# Patient Record
Sex: Female | Born: 1980 | Race: Black or African American | Hispanic: No | Marital: Married | Smoking: Current every day smoker
Health system: Southern US, Community
[De-identification: ages and names within clinical notes are randomized; demographics above are authoritative.]

## PROBLEM LIST (undated history)

## (undated) DIAGNOSIS — E119 Type 2 diabetes mellitus without complications: Secondary | ICD-10-CM

## (undated) DIAGNOSIS — R519 Headache, unspecified: Secondary | ICD-10-CM

## (undated) DIAGNOSIS — E039 Hypothyroidism, unspecified: Secondary | ICD-10-CM

## (undated) DIAGNOSIS — M810 Age-related osteoporosis without current pathological fracture: Secondary | ICD-10-CM

## (undated) DIAGNOSIS — I10 Essential (primary) hypertension: Secondary | ICD-10-CM

## (undated) DIAGNOSIS — M1990 Unspecified osteoarthritis, unspecified site: Secondary | ICD-10-CM

## (undated) DIAGNOSIS — F32A Depression, unspecified: Secondary | ICD-10-CM

## (undated) DIAGNOSIS — E079 Disorder of thyroid, unspecified: Secondary | ICD-10-CM

## (undated) DIAGNOSIS — M2241 Chondromalacia patellae, right knee: Secondary | ICD-10-CM

## (undated) DIAGNOSIS — E785 Hyperlipidemia, unspecified: Secondary | ICD-10-CM

## (undated) DIAGNOSIS — Z9889 Other specified postprocedural states: Secondary | ICD-10-CM

## (undated) DIAGNOSIS — J45909 Unspecified asthma, uncomplicated: Secondary | ICD-10-CM

## (undated) DIAGNOSIS — R51 Headache: Secondary | ICD-10-CM

## (undated) HISTORY — DX: Depression, unspecified: F32.A

## (undated) HISTORY — DX: Unspecified osteoarthritis, unspecified site: M19.90

## (undated) HISTORY — DX: Age-related osteoporosis without current pathological fracture: M81.0

## (undated) HISTORY — PX: OTHER SURGICAL HISTORY: SHX169

## (undated) HISTORY — DX: Hyperlipidemia, unspecified: E78.5

## (undated) HISTORY — DX: Other specified postprocedural states: Z98.890

## (undated) HISTORY — DX: Chondromalacia patellae, right knee: M22.41

---

## 2010-10-17 DIAGNOSIS — Q690 Accessory finger(s): Secondary | ICD-10-CM | POA: Insufficient documentation

## 2010-10-17 DIAGNOSIS — J45909 Unspecified asthma, uncomplicated: Secondary | ICD-10-CM | POA: Insufficient documentation

## 2011-07-07 ENCOUNTER — Encounter (HOSPITAL_COMMUNITY): Payer: Self-pay | Admitting: *Deleted

## 2011-07-07 ENCOUNTER — Emergency Department (HOSPITAL_COMMUNITY)
Admission: EM | Admit: 2011-07-07 | Discharge: 2011-07-08 | Disposition: A | Discharge status: Skilled Nursing Facility | Payer: Self-pay | Attending: Emergency Medicine | Admitting: Emergency Medicine

## 2011-07-07 ENCOUNTER — Emergency Department (HOSPITAL_COMMUNITY): Payer: Self-pay

## 2011-07-07 DIAGNOSIS — M25569 Pain in unspecified knee: Secondary | ICD-10-CM | POA: Insufficient documentation

## 2011-07-07 DIAGNOSIS — R109 Unspecified abdominal pain: Secondary | ICD-10-CM | POA: Insufficient documentation

## 2011-07-07 DIAGNOSIS — R112 Nausea with vomiting, unspecified: Secondary | ICD-10-CM | POA: Insufficient documentation

## 2011-07-07 LAB — CBC
HCT: 36.4 % (ref 36.0–46.0)
Hemoglobin: 11.8 g/dL — ABNORMAL LOW (ref 12.0–15.0)
MCH: 23.6 pg — ABNORMAL LOW (ref 26.0–34.0)
MCHC: 32.4 g/dL (ref 30.0–36.0)
MCV: 72.8 fL — ABNORMAL LOW (ref 78.0–100.0)
Platelets: 224 10*3/uL (ref 150–400)
RBC: 5 MIL/uL (ref 3.87–5.11)
RDW: 16.2 % — ABNORMAL HIGH (ref 11.5–15.5)
WBC: 9.8 10*3/uL (ref 4.0–10.5)

## 2011-07-07 LAB — BASIC METABOLIC PANEL
BUN: 7 mg/dL (ref 6–23)
CO2: 28 mEq/L (ref 19–32)
Calcium: 10 mg/dL (ref 8.4–10.5)
Chloride: 100 mEq/L (ref 96–112)
Creatinine, Ser: 0.7 mg/dL (ref 0.50–1.10)
GFR calc Af Amer: >90 mL/min (ref >90)
GFR calc non Af Amer: >90 mL/min (ref >90)
Glucose, Bld: 88 mg/dL (ref 70–99)
Potassium: 3.5 mEq/L (ref 3.5–5.1)
Sodium: 136 mEq/L (ref 135–145)

## 2011-07-07 LAB — URINALYSIS, ROUTINE W REFLEX MICROSCOPIC
Bilirubin Urine: NEGATIVE
Glucose, UA: NEGATIVE mg/dL
Hgb urine dipstick: NEGATIVE
Ketones, ur: NEGATIVE mg/dL
Leukocytes, UA: NEGATIVE
Nitrite: NEGATIVE
Protein, ur: NEGATIVE mg/dL
Specific Gravity, Urine: 1.025 (ref 1.005–1.030)
Urobilinogen, UA: 1 mg/dL (ref 0.0–1.0)
pH: 7.5 (ref 5.0–8.0)

## 2011-07-07 LAB — LIPASE, BLOOD: Lipase: 39 U/L (ref 11–59)

## 2011-07-07 LAB — POCT PREGNANCY, URINE: Preg Test, Ur: NEGATIVE

## 2011-07-07 NOTE — ED Provider Notes (Signed)
History     CSN: [id number]  Arrival date & time 07/07/11  2044   First MD Initiated Contact with Patient 07/07/11 2221      Chief Complaint  Patient presents with  . Abdominal Pain  . Nausea  . Emesis  . Knee Pain    (Consider location/radiation/quality/duration/timing/severity/associated sxs/prior treatment) HPI Comments: Patient states she has had left side abdomen pain for nearly one year. She states this was last evaluated several months ago at which time she was told she had a left lung pneumonia. She states that the pain really hadn't changed a lot but she wanted to get it checked because she is worried and concerned about it. It is also of note that the patient had one day of nausea vomiting today. There is no blood in the vomitus. The patient denies fever. She denies injury to the abdomen. There's been no surgery to this left side of the abdomen. There's been no cough or congestion reported. Patient denies passing blood in the stools or blood in the urine. There's been no unusual vaginal discharge or bleeding.       The patient has not taken any medication for this pain.  Patient is a 31 y.o. female presenting with abdominal pain, vomiting, and knee pain. The history is provided by the patient.  Abdominal Pain The primary symptoms of the illness include abdominal pain and vomiting. The primary symptoms of the illness do not include shortness of breath or dysuria. The current episode started more than 2 days ago.  Symptoms associated with the illness do not include hematuria, frequency or back pain.  Emesis  Associated symptoms include abdominal pain and arthralgias. Pertinent negatives include no cough.  Knee Pain Associated symptoms include abdominal pain, arthralgias and vomiting. Pertinent negatives include no chest pain, coughing or neck pain.    History reviewed. No pertinent past medical history.  History reviewed. No pertinent past surgical history.  History  reviewed. No pertinent family history.  History  Substance Use Topics  . Smoking status: Current Everyday Smoker  . Smokeless tobacco: Not on file  . Alcohol Use: Yes    OB History    Grav Para Term Preterm Abortions TAB SAB Ect Mult Living                  Review of Systems  Constitutional: Negative for activity change.       All ROS Neg except as noted in HPI  HENT: Negative for nosebleeds and neck pain.   Eyes: Negative for photophobia and discharge.  Respiratory: Negative for cough, shortness of breath and wheezing.   Cardiovascular: Negative for chest pain and palpitations.  Gastrointestinal: Positive for vomiting and abdominal pain. Negative for blood in stool.  Genitourinary: Negative for dysuria, frequency and hematuria.  Musculoskeletal: Positive for arthralgias. Negative for back pain.  Skin: Negative.   Neurological: Negative for dizziness, seizures and speech difficulty.  Psychiatric/Behavioral: Negative for hallucinations and confusion.    Allergies  Review of patient's allergies indicates no known allergies.  Home Medications  No current outpatient prescriptions on file.  BP 125/72  Pulse 89  Temp 98.3 F (36.8 C)  Resp 20  Ht 5\' 3"  (1.6 m)  Wt 224 lb (101.606 kg)  BMI 39.68 kg/m2  SpO2 100%  LMP 06/25/2011  Physical Exam  Nursing note and vitals reviewed. Constitutional: She is oriented to person, place, and time. She appears well-developed and well-nourished.  Non-toxic appearance.  HENT:  Head: Normocephalic.  Right  Ear: Tympanic membrane and external ear normal.  Left Ear: Tympanic membrane and external ear normal.  Eyes: EOM and lids are normal. Pupils are equal, round, and reactive to light.  Neck: Normal range of motion. Neck supple. Carotid bruit is not present.  Cardiovascular: Normal rate, regular rhythm, normal heart sounds, intact distal pulses and normal pulses.   Pulmonary/Chest: Breath sounds normal. No respiratory distress.    Abdominal: Soft. Bowel sounds are normal. There is no tenderness. There is no guarding.       Mild to moderate left upper quadrant abdominal pain to palpation. No pain with flexing of the psoas. No mass appreciated. No pulsating mass appreciated.  Musculoskeletal: Normal range of motion.  Lymphadenopathy:       Head (right side): No submandibular adenopathy present.       Head (left side): No submandibular adenopathy present.    She has no cervical adenopathy.  Neurological: She is alert and oriented to person, place, and time. She has normal strength. No cranial nerve deficit or sensory deficit.  Skin: Skin is warm and dry.  Psychiatric: She has a normal mood and affect. Her speech is normal.    ED Course  Procedures (including critical care time)   Labs Reviewed  URINALYSIS, ROUTINE W REFLEX MICROSCOPIC   No results found.   No diagnosis found.    MDM  I have reviewed nursing notes, vital signs, and all appropriate lab and imaging results for this patient. Patient reports a nearly one-year of left-sided abdomen pain as well as some epigastric area pain. Vital signs today are within normal limits. Pregnancy test is negative. Chemistries are negative. Lipase is within normal limits. Complete blood count is nonacute. Urinalysis is well within normal limits. The acute abdomen films are unremarkable for any acute problem. There is some increase in stool and gas throughout the colon.  The patient advised of the laboratory findings and the exam findings. Will treat the patient with milk of magnesia 15 mL daily. As and give her a prescription for Norco 5 mg every 4 hours if needed for pain. Patient is to see Dr. Darrick Penna (GI) if not improving. It is felt to be that the patient is safe to return home, she is invited to return to the emergency department if any changes, problems, or concerns.       Kathie Dike, [place] 07/08/11 [phone]

## 2011-07-07 NOTE — ED Notes (Signed)
Pt c/o epigastric and upper left sided abdominal pain. Pt c/o n/v x 1 day and bilateral knee pain x 1wk.

## 2011-07-07 NOTE — ED Notes (Signed)
Patient does not feel as if she void at present

## 2011-07-08 MED ORDER — ONDANSETRON HCL 4 MG PO TABS
4.0000 mg | ORAL_TABLET | Freq: Once | ORAL | Status: AC
  Administered 2011-07-08: 4 mg via ORAL
  Filled 2011-07-08: qty 1

## 2011-07-08 MED ORDER — MAGNESIUM HYDROXIDE 400 MG/5ML PO SUSP: 15.0000 mL | Freq: Every day | ORAL | Status: AC

## 2011-07-08 MED ORDER — HYDROCODONE-ACETAMINOPHEN 5-325 MG PO TABS
2.0000 | ORAL_TABLET | Freq: Once | ORAL | Status: AC
  Administered 2011-07-08: 2 via ORAL
  Filled 2011-07-08: qty 2

## 2011-07-08 MED ORDER — MAGNESIUM HYDROXIDE 400 MG/5ML PO SUSP
15.0000 mL | Freq: Once | ORAL | Status: AC
  Administered 2011-07-08: 15 mL via ORAL
  Filled 2011-07-08: qty 30

## 2011-07-08 MED ORDER — HYDROCODONE-ACETAMINOPHEN 5-325 MG PO TABS: 1.0000 | ORAL_TABLET | ORAL | Status: AC | PRN

## 2011-07-08 NOTE — Discharge Instructions (Signed)
Your vital signs are within normal limits. Your blood test are negative for acute problem. Your xrays are negative for acute problem. Please use Milk of Magnesia daily. Use tylenol for mild pain. Use Norco for more severe pain. This may cause drowsiness, use with caution. Please see Dr Darrick Penna for GI evaluation if not improving.

## 2011-07-09 NOTE — ED Provider Notes (Signed)
Medical screening examination/treatment/procedure(s) were performed by non-physician practitioner and as supervising physician I was immediately available for consultation/collaboration.   Ryah Cribb W Adriauna Campton, MD 07/09/11 0125 

## 2011-09-02 ENCOUNTER — Emergency Department (HOSPITAL_COMMUNITY)
Admission: EM | Admit: 2011-09-02 | Discharge: 2011-09-02 | Disposition: A | Discharge status: Home / Self Care | Payer: Self-pay | Attending: Emergency Medicine | Admitting: Emergency Medicine

## 2011-09-02 ENCOUNTER — Encounter (HOSPITAL_COMMUNITY): Payer: Self-pay | Admitting: *Deleted

## 2011-09-02 DIAGNOSIS — F17200 Nicotine dependence, unspecified, uncomplicated: Secondary | ICD-10-CM | POA: Insufficient documentation

## 2011-09-02 DIAGNOSIS — R197 Diarrhea, unspecified: Secondary | ICD-10-CM

## 2011-09-02 DIAGNOSIS — J45909 Unspecified asthma, uncomplicated: Secondary | ICD-10-CM | POA: Insufficient documentation

## 2011-09-02 DIAGNOSIS — R509 Fever, unspecified: Secondary | ICD-10-CM | POA: Insufficient documentation

## 2011-09-02 HISTORY — DX: Unspecified asthma, uncomplicated: J45.909

## 2011-09-02 LAB — URINALYSIS, ROUTINE W REFLEX MICROSCOPIC
Bilirubin Urine: NEGATIVE
Glucose, UA: NEGATIVE mg/dL
Hgb urine dipstick: NEGATIVE
Ketones, ur: NEGATIVE mg/dL
Leukocytes, UA: NEGATIVE
Nitrite: NEGATIVE
Protein, ur: NEGATIVE mg/dL
Specific Gravity, Urine: 1.025 (ref 1.005–1.030)
Urobilinogen, UA: 1 mg/dL (ref 0.0–1.0)
pH: 6 (ref 5.0–8.0)

## 2011-09-02 MED ORDER — LOPERAMIDE HCL 2 MG PO CAPS: 2.0000 mg | ORAL_CAPSULE | Freq: Four times a day (QID) | ORAL | Status: AC | PRN

## 2011-09-02 MED ORDER — ACETAMINOPHEN 325 MG PO TABS
650.0000 mg | ORAL_TABLET | Freq: Once | ORAL | Status: AC
  Administered 2011-09-02: 650 mg via ORAL
  Filled 2011-09-02: qty 2

## 2011-09-02 MED ORDER — ONDANSETRON HCL 4 MG/2ML IJ SOLN
4.0000 mg | Freq: Once | INTRAMUSCULAR | Status: AC
  Administered 2011-09-02: 4 mg via INTRAVENOUS
  Filled 2011-09-02: qty 2

## 2011-09-02 MED ORDER — IBUPROFEN 800 MG PO TABS: 800.0000 mg | ORAL_TABLET | Freq: Three times a day (TID) | ORAL | Status: AC

## 2011-09-02 MED ORDER — SODIUM CHLORIDE 0.9 % IV BOLUS (SEPSIS)
1000.0000 mL | Freq: Once | INTRAVENOUS | Status: AC
  Administered 2011-09-02: 1000 mL via INTRAVENOUS

## 2011-09-02 MED ORDER — KETOROLAC TROMETHAMINE 30 MG/ML IJ SOLN
30.0000 mg | Freq: Once | INTRAMUSCULAR | Status: AC
  Administered 2011-09-02: 30 mg via INTRAVENOUS
  Filled 2011-09-02: qty 1

## 2011-09-02 NOTE — ED Notes (Signed)
Pt c/o fever, chills, and diarrhea since this morning.

## 2011-09-02 NOTE — ED Provider Notes (Signed)
History     CSN: [id number]  Arrival date & time 09/02/11  0035   First MD Initiated Contact with Patient 09/02/11 0035      Chief Complaint  Patient presents with  . Fever  . Diarrhea  . Chills    (Consider location/radiation/quality/duration/timing/severity/associated sxs/prior treatment) HPI Hx per PT. Fever and chills with body aches today, some muscle aches in her lower back. No dysuria, urgency or frequency otherwise.  No ABD pain, no nause or vomiting, has had loose stools tonight, no blood in stools. No recent travel or ABx, no known sick contacts. No rash. MOD in severity.  Past Medical History  Diagnosis Date  . Asthma     History reviewed. No pertinent past surgical history.  No family history on file.  History  Substance Use Topics  . Smoking status: Current Everyday Smoker  . Smokeless tobacco: Not on file  . Alcohol Use: Yes    OB History    Grav Para Term Preterm Abortions TAB SAB Ect Mult Living                  Review of Systems  Constitutional: Positive for fever and chills.  HENT: Negative for neck pain and neck stiffness.   Eyes: Negative for pain.  Respiratory: Negative for shortness of breath.   Cardiovascular: Negative for chest pain.  Gastrointestinal: Positive for diarrhea. Negative for abdominal pain, blood in stool and anal bleeding.  Genitourinary: Negative for dysuria.  Musculoskeletal: Negative for back pain.  Skin: Negative for rash.  Neurological: Negative for headaches.  All other systems reviewed and are negative.    Allergies  Review of patient's allergies indicates no known allergies.  Home Medications   Current Outpatient Rx  Name Route Sig Dispense Refill  . ALBUTEROL SULFATE HFA 108 (90 BASE) MCG/ACT IN AERS Inhalation Inhale 2 puffs into the lungs every 6 (six) hours as needed. Shortness of breath      BP 96/55  Pulse 90  Temp 99 F (37.2 C) (Oral)  Resp 20  Ht 5\' 5"  (1.651 m)  Wt 230 lb (104.327 kg)  BMI  38.27 kg/m2  SpO2 95%  LMP 08/06/2011  Physical Exam  Constitutional: She is oriented to person, place, and time. She appears well-developed and well-nourished.  HENT:  Head: Normocephalic and atraumatic.  Eyes: Conjunctivae and EOM are normal. Pupils are equal, round, and reactive to light.  Neck: Trachea normal. Neck supple. No thyromegaly present.  Cardiovascular: Regular rhythm, S1 normal, S2 normal and normal pulses.     No systolic murmur is present   No diastolic murmur is present  Pulses:      Radial pulses are 2+ on the right side, and 2+ on the left side.       tachycardic  Pulmonary/Chest: Effort normal and breath sounds normal. She has no wheezes. She has no rhonchi. She has no rales. She exhibits no tenderness.  Abdominal: Soft. Normal appearance and bowel sounds are normal. There is no tenderness. There is no rebound, no guarding, no CVA tenderness and negative Murphy's sign.  Musculoskeletal:       BLE:s Calves nontender, no cords or erythema, negative Homans sign  Neurological: She is alert and oriented to person, place, and time. She has normal strength. No cranial nerve deficit or sensory deficit. GCS eye subscore is 4. GCS verbal subscore is 5. GCS motor subscore is 6.  Skin: Skin is warm and dry. No rash noted. She is not diaphoretic.  Psychiatric: Her speech is normal.       Cooperative and appropriate    ED Course  Procedures (including critical care time)  Results for orders placed during the hospital encounter of 09/02/11  URINALYSIS, ROUTINE W REFLEX MICROSCOPIC      Component Value Range   Color, Urine YELLOW  YELLOW   APPearance CLEAR  CLEAR   Specific Gravity, Urine 1.025  1.005 - 1.030   pH 6.0  5.0 - 8.0   Glucose, UA NEGATIVE  NEGATIVE mg/dL   Hgb urine dipstick NEGATIVE  NEGATIVE   Bilirubin Urine NEGATIVE  NEGATIVE   Ketones, ur NEGATIVE  NEGATIVE mg/dL   Protein, ur NEGATIVE  NEGATIVE mg/dL   Urobilinogen, UA 1.0  0.0 - 1.0 mg/dL   Nitrite  NEGATIVE  NEGATIVE   Leukocytes, UA NEGATIVE  NEGATIVE   IVFs. Tylenol and zofran   Serial exams, no acute ABD. No indication for emergent imaging, given otherwise healthy female with normal UA and improving condition, do not feel lab work is indicated, no risk factors for  C diff.  No blood in stools or indication for ABx to treat infectious colitis. Plan d/c home with close follow up and diarrhea/ fever precautions verbalized as understood.   Rx provided.  MDM  nursing notes and VS reviewed- tachycardia normalized with improved condition, IVFs. serial exams. Medications as above.          Sunnie Nielsen, MD 09/02/11 0330

## 2011-09-02 NOTE — ED Notes (Signed)
Resting quietly, appears to be sleeping.

## 2011-09-02 NOTE — ED Notes (Signed)
Ambulatory to bathroom with steady gait

## 2012-03-16 ENCOUNTER — Emergency Department (HOSPITAL_COMMUNITY)
Admission: EM | Admit: 2012-03-16 | Discharge: 2012-03-16 | Disposition: A | Discharge status: Home / Self Care | Payer: Self-pay | Attending: Emergency Medicine | Admitting: Emergency Medicine

## 2012-03-16 ENCOUNTER — Encounter (HOSPITAL_COMMUNITY): Payer: Self-pay

## 2012-03-16 DIAGNOSIS — S0502XA Injury of conjunctiva and corneal abrasion without foreign body, left eye, initial encounter: Secondary | ICD-10-CM

## 2012-03-16 DIAGNOSIS — J45909 Unspecified asthma, uncomplicated: Secondary | ICD-10-CM | POA: Insufficient documentation

## 2012-03-16 DIAGNOSIS — Y9289 Other specified places as the place of occurrence of the external cause: Secondary | ICD-10-CM | POA: Insufficient documentation

## 2012-03-16 DIAGNOSIS — T1590XA Foreign body on external eye, part unspecified, unspecified eye, initial encounter: Secondary | ICD-10-CM | POA: Insufficient documentation

## 2012-03-16 DIAGNOSIS — S058X9A Other injuries of unspecified eye and orbit, initial encounter: Secondary | ICD-10-CM | POA: Insufficient documentation

## 2012-03-16 DIAGNOSIS — Y9301 Activity, walking, marching and hiking: Secondary | ICD-10-CM | POA: Insufficient documentation

## 2012-03-16 DIAGNOSIS — F17200 Nicotine dependence, unspecified, uncomplicated: Secondary | ICD-10-CM | POA: Insufficient documentation

## 2012-03-16 MED ORDER — NEOMYCIN-BACITRACIN ZN-POLYMYX 5-400-10000 OP OINT
TOPICAL_OINTMENT | Freq: Once | OPHTHALMIC | Status: AC
  Administered 2012-03-16: 20:00:00 via OPHTHALMIC

## 2012-03-16 MED ORDER — TETRACAINE HCL 0.5 % OP SOLN
2.0000 [drp] | Freq: Once | OPHTHALMIC | Status: AC
  Administered 2012-03-16: 2 [drp] via OPHTHALMIC

## 2012-03-16 MED ORDER — KETOROLAC TROMETHAMINE 0.5 % OP SOLN
1.0000 [drp] | Freq: Once | OPHTHALMIC | Status: AC
  Administered 2012-03-16: 1 [drp] via OPHTHALMIC
  Filled 2012-03-16: qty 5

## 2012-03-16 MED ORDER — POLYMYXIN B-TRIMETHOPRIM 10000-0.1 UNIT/ML-% OP SOLN
1.0000 [drp] | OPHTHALMIC | Status: DC
  Filled 2012-03-16: qty 10

## 2012-03-16 MED ORDER — PROPARACAINE HCL 0.5 % OP SOLN
2.0000 [drp] | Freq: Once | OPHTHALMIC | Status: DC
  Filled 2012-03-16: qty 15

## 2012-03-16 MED ORDER — FLUORESCEIN SODIUM 1 MG OP STRP
ORAL_STRIP | OPHTHALMIC | Status: AC
  Administered 2012-03-16: 1 via OPHTHALMIC
  Filled 2012-03-16: qty 1

## 2012-03-16 MED ORDER — FLUORESCEIN SODIUM 1 MG OP STRP
1.0000 | ORAL_STRIP | Freq: Once | OPHTHALMIC | Status: AC
  Administered 2012-03-16: 1 via OPHTHALMIC

## 2012-03-16 MED ORDER — BACITRACIN-NEOMYCIN-POLYMYXIN 400-5-5000 EX OINT: TOPICAL_OINTMENT | Freq: Four times a day (QID) | CUTANEOUS | Status: DC

## 2012-03-16 MED ORDER — TETRACAINE HCL 0.5 % OP SOLN
OPHTHALMIC | Status: AC
  Administered 2012-03-16: 2 [drp] via OPHTHALMIC
  Filled 2012-03-16: qty 2
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
## 2012-03-16 NOTE — ED Notes (Signed)
Dr Blinda Leatherwood in room assessing pt with slit lamp.

## 2012-03-16 NOTE — ED Notes (Signed)
Pt sent home with eye drops per MD instruction. NAD at d/c, pt states slight improvement in pain in eye

## 2012-06-27 ENCOUNTER — Encounter (HOSPITAL_COMMUNITY): Payer: Self-pay | Admitting: *Deleted

## 2012-06-27 ENCOUNTER — Emergency Department (HOSPITAL_COMMUNITY)
Admission: EM | Admit: 2012-06-27 | Discharge: 2012-06-27 | Disposition: A | Discharge status: Home / Self Care | Payer: Self-pay | Attending: Emergency Medicine | Admitting: Emergency Medicine

## 2012-06-27 ENCOUNTER — Emergency Department (HOSPITAL_COMMUNITY): Payer: Self-pay

## 2012-06-27 DIAGNOSIS — R071 Chest pain on breathing: Secondary | ICD-10-CM | POA: Insufficient documentation

## 2012-06-27 DIAGNOSIS — J159 Unspecified bacterial pneumonia: Secondary | ICD-10-CM | POA: Insufficient documentation

## 2012-06-27 DIAGNOSIS — R0602 Shortness of breath: Secondary | ICD-10-CM | POA: Insufficient documentation

## 2012-06-27 DIAGNOSIS — R059 Cough, unspecified: Secondary | ICD-10-CM | POA: Insufficient documentation

## 2012-06-27 DIAGNOSIS — F17200 Nicotine dependence, unspecified, uncomplicated: Secondary | ICD-10-CM | POA: Insufficient documentation

## 2012-06-27 DIAGNOSIS — R1110 Vomiting, unspecified: Secondary | ICD-10-CM | POA: Insufficient documentation

## 2012-06-27 DIAGNOSIS — R05 Cough: Secondary | ICD-10-CM | POA: Insufficient documentation

## 2012-06-27 DIAGNOSIS — J45901 Unspecified asthma with (acute) exacerbation: Secondary | ICD-10-CM | POA: Insufficient documentation

## 2012-06-27 DIAGNOSIS — Z79899 Other long term (current) drug therapy: Secondary | ICD-10-CM | POA: Insufficient documentation

## 2012-06-27 DIAGNOSIS — J189 Pneumonia, unspecified organism: Secondary | ICD-10-CM

## 2012-06-27 MED ORDER — AZITHROMYCIN 250 MG PO TABS: ORAL_TABLET | ORAL | Status: DC

## 2012-06-27 MED ORDER — IPRATROPIUM BROMIDE 0.02 % IN SOLN
0.5000 mg | Freq: Once | RESPIRATORY_TRACT | Status: AC
  Administered 2012-06-27: 0.5 mg via RESPIRATORY_TRACT
  Filled 2012-06-27: qty 2.5

## 2012-06-27 MED ORDER — AZITHROMYCIN 250 MG PO TABS
500.0000 mg | ORAL_TABLET | Freq: Once | ORAL | Status: AC
  Administered 2012-06-27: 500 mg via ORAL
  Filled 2012-06-27: qty 2

## 2012-06-27 MED ORDER — ALBUTEROL SULFATE (5 MG/ML) 0.5% IN NEBU
5.0000 mg | INHALATION_SOLUTION | Freq: Once | RESPIRATORY_TRACT | Status: AC
  Administered 2012-06-27: 5 mg via RESPIRATORY_TRACT
  Filled 2012-06-27: qty 1

## 2012-06-27 MED ORDER — CEPHALEXIN 500 MG PO CAPS: ORAL_CAPSULE | ORAL | Status: DC

## 2012-06-27 MED ORDER — PREDNISONE 20 MG PO TABS: ORAL_TABLET | ORAL | Status: DC

## 2012-06-27 MED ORDER — CEPHALEXIN 500 MG PO CAPS
1000.0000 mg | ORAL_CAPSULE | Freq: Once | ORAL | Status: AC
  Administered 2012-06-27: 1000 mg via ORAL
  Filled 2012-06-27: qty 2

## 2012-06-27 MED ORDER — HYDROCODONE-ACETAMINOPHEN 5-325 MG PO TABS: 2.0000 | ORAL_TABLET | Freq: Four times a day (QID) | ORAL | Status: DC | PRN

## 2012-06-27 MED ORDER — PREDNISONE 50 MG PO TABS
60.0000 mg | ORAL_TABLET | Freq: Once | ORAL | Status: AC
  Administered 2012-06-27: 60 mg via ORAL
  Filled 2012-06-27: qty 1

## 2012-06-27 NOTE — ED Provider Notes (Signed)
History    This chart was scribed for Michelle Horn, MD by Quintella Reichert, ED scribe.  This patient was seen in room [facility] and the patient's care was started at 9:12 PM.   CSN: [id number]  Arrival date & time 06/27/12  1916     Chief Complaint  Patient presents with  . Cough  . Emesis     The history is provided by the patient. No language interpreter was used.    HPI Comments: [name] is a 32 y.o. female with asthma who presents to the Emergency Department complaining of frequent cough that began yesterday afternoon, with accompanying wheezing, mild SOB, post-tussive emesis, and mild right-sided rib pain secondary to cough.  She denies abdominal pain, fever, chills, or any other associated symptoms.  She attempted to treat symptoms with her albuterol inhaler, without relief.  Pt last received steroid treatment over 1 year ago.  She denies h/o cancer, blood clots, DM, sickle cell disease or any other chronic medical conditions.  She denies possibility of pregnancy and is not on birth control.  PERC negative.  Past Medical History  Diagnosis Date  . Asthma     History reviewed. No pertinent past surgical history.  History reviewed. No pertinent family history.  History  Substance Use Topics  . Smoking status: Current Every Day Smoker -- 1.00 packs/day  . Smokeless tobacco: Not on file  . Alcohol Use: Yes     Comment: Social drinker     OB History   Grav Para Term Preterm Abortions TAB SAB Ect Mult Living                  Review of Systems 10 Systems reviewed and all are negative for acute change except as noted in the HPI.    Allergies  Review of patient's allergies indicates no known allergies.  Home Medications   Current Outpatient Rx  Name  Route  Sig  Dispense  Refill  . albuterol (VENTOLIN HFA) 108 (90 BASE) MCG/ACT inhaler   Inhalation   Inhale 2 puffs into the lungs every 6 (six) hours as needed for wheezing or shortness of  breath.         . levothyroxine (SYNTHROID, LEVOTHROID) 25 MCG tablet   Oral   Take 25 mcg by mouth daily.         Marland Kitchen azithromycin (ZITHROMAX Z-PAK) 250 MG tablet      1 daily x 4 days   4 tablet   0   . cephALEXin (KEFLEX) 500 MG capsule      2 caps po bid x 7 days   28 capsule   0   . HYDROcodone-acetaminophen (NORCO) 5-325 MG per tablet   Oral   Take 2 tablets by mouth every 6 (six) hours as needed for pain.   15 tablet   0   . predniSONE (DELTASONE) 20 MG tablet      2 tabs po daily x 4 days   8 tablet   0     BP 138/78  Pulse 90  Temp(Src) 99.2 F (37.3 C) (Oral)  Resp 20  Ht 5\' 3"  (1.6 m)  Wt 250 lb (113.399 kg)  BMI 44.3 kg/m2  SpO2 97%  LMP 06/13/2012  Physical Exam  Nursing note and vitals reviewed. Constitutional:  Awake, alert, nontoxic appearance.  HENT:  Head: Atraumatic.  Eyes: Right eye exhibits no discharge. Left eye exhibits no discharge.  Neck: Neck supple.  Cardiovascular: Normal rate, regular  rhythm and normal heart sounds.   No murmur heard. Pulmonary/Chest: Effort normal. No respiratory distress. She has wheezes (Diffuse wheezes, inspiratory and expiratory). She has no rales. She exhibits tenderness.  Chest wall is diffusely tender No crackles, no accessory muscle usage.  Abdominal: Soft. There is no tenderness. There is no rebound.  Musculoskeletal: She exhibits no tenderness.  Baseline ROM, no obvious new focal weakness.  Neurological:  Mental status and motor strength appears baseline for patient and situation.  Skin: No rash noted.  Psychiatric: She has a normal mood and affect.    ED Course  Procedures (including critical care time)  DIAGNOSTIC STUDIES: Oxygen Saturation is 97% on room air, normal by my interpretation.    COORDINATION OF CARE: 9:17 PM-Informed pt that x-ray revealed possible pneumonia.  Discussed treatment plan which includes breathing treatment, steroid treatment, antibiotics and pain medicine.    Patient understands and agrees with initial ED impression and plan with expectations set for ED visit.  10:11 PM: Pt feels better after breathing treatment.  Exhibits mild scattered end-expiratory wheezing.  Air movement significantly improved.  Patient informed of clinical course, understands medical decision-making process, and agrees with plan.   Labs Reviewed - No data to display Dg Chest 2 View  06/27/2012   *RADIOLOGY REPORT*  Clinical Data: Chest pain.  Vomiting.  CHEST - 2 VIEW  Comparison: None.  Findings: Mild airspace disease is seen in the posterior right lower lobe. Left lung is clear.  No evidence of pleural effusion. Heart size is normal.  IMPRESSION: Mild posterior right lower lobe airspace disease.  Differential diagnosis includes pneumonia and hemorrhage.   Original Report Authenticated By: Myles Rosenthal, M.D.     1. CAP (community acquired pneumonia)   2. Asthma attack       MDM  I personally performed the services described in this documentation, which was scribed in my presence. The recorded information has been reviewed and is accurate. Patient / Family / Caregiver informed of clinical course, understand medical decision-making process, and agree with plan. I doubt any other EMC precluding discharge at this time including, but not necessarily limited to the following:sepsis.    Michelle Horn, MD 06/28/12 [phone]

## 2012-06-27 NOTE — ED Notes (Signed)
Pt c/o chest pain when she coughs. Pt states every time she eats, her food comes right back up.

## 2012-06-27 NOTE — ED Notes (Signed)
Pt states chest pain since yesterday, states worse with a deep breath and accompanied by a strong cough. STates when she coughs the pain is worse and sometimes causes her to vomit. Cough is noted on assessment.

## 2012-06-27 NOTE — ED Notes (Signed)
Dr. Bednar at bedside. 

## 2012-07-04 ENCOUNTER — Emergency Department (HOSPITAL_COMMUNITY)
Admission: EM | Admit: 2012-07-04 | Discharge: 2012-07-04 | Disposition: A | Discharge status: Home / Self Care | Payer: Self-pay | Attending: Emergency Medicine | Admitting: Emergency Medicine

## 2012-07-04 ENCOUNTER — Encounter (HOSPITAL_COMMUNITY): Payer: Self-pay

## 2012-07-04 ENCOUNTER — Emergency Department (HOSPITAL_COMMUNITY): Payer: Self-pay

## 2012-07-04 DIAGNOSIS — J189 Pneumonia, unspecified organism: Secondary | ICD-10-CM | POA: Insufficient documentation

## 2012-07-04 DIAGNOSIS — R079 Chest pain, unspecified: Secondary | ICD-10-CM | POA: Insufficient documentation

## 2012-07-04 DIAGNOSIS — J45909 Unspecified asthma, uncomplicated: Secondary | ICD-10-CM | POA: Insufficient documentation

## 2012-07-04 DIAGNOSIS — R51 Headache: Secondary | ICD-10-CM | POA: Insufficient documentation

## 2012-07-04 DIAGNOSIS — Z79899 Other long term (current) drug therapy: Secondary | ICD-10-CM | POA: Insufficient documentation

## 2012-07-04 DIAGNOSIS — M2540 Effusion, unspecified joint: Secondary | ICD-10-CM | POA: Insufficient documentation

## 2012-07-04 DIAGNOSIS — J029 Acute pharyngitis, unspecified: Secondary | ICD-10-CM | POA: Insufficient documentation

## 2012-07-04 DIAGNOSIS — R05 Cough: Secondary | ICD-10-CM | POA: Insufficient documentation

## 2012-07-04 DIAGNOSIS — R059 Cough, unspecified: Secondary | ICD-10-CM | POA: Insufficient documentation

## 2012-07-04 DIAGNOSIS — F17200 Nicotine dependence, unspecified, uncomplicated: Secondary | ICD-10-CM | POA: Insufficient documentation

## 2012-07-04 DIAGNOSIS — R6884 Jaw pain: Secondary | ICD-10-CM | POA: Insufficient documentation

## 2012-07-04 MED ORDER — NAPROXEN 500 MG PO TABS: 500.0000 mg | ORAL_TABLET | Freq: Two times a day (BID) | ORAL | Status: DC

## 2012-07-04 NOTE — ED Notes (Signed)
Pt reports being dx with pna on Friday and is here to follow up.  Pt reports pain to her ribs bilaterally and sore throat.  Pt reports taking her antibiotics as prescribed.

## 2012-07-04 NOTE — ED Provider Notes (Signed)
History    This chart was scribed for Shelda Jakes, MD by Leone Payor, ED Scribe. This patient was seen in room APA04/APA04 and the patient's care was started 7:41 AM.   CSN: [id number]  Arrival date & time 07/04/12  0715   First MD Initiated Contact with Patient 07/04/12 0735      No chief complaint on file.    The history is provided by the patient. No language interpreter was used.    HPI Comments: [name] is a 32 y.o. female who presents to the Emergency Department complaining of constant, unchanged body aches starting in the last few days. Pt was diagnosed with pneumonia 1 week ago and she is here to follow up because her symptoms are not improving. She reports having an associated mild sore throat and L jaw pain that started yesterday. She continues to feel body aches and productive cough. She denies having any recent fevers. She was prescribed Keflex, Zithromax, and prednisone which she has been compliant with. She has a h/o asthma and uses an inhaler at home.   PCP [facility] Dept Past Medical History  Diagnosis Date  . Asthma     History reviewed. No pertinent past surgical history.  No family history on file.  History  Substance Use Topics  . Smoking status: Current Every Day Smoker -- 1.00 packs/day  . Smokeless tobacco: Not on file  . Alcohol Use: Yes     Comment: Social drinker     OB History   Grav Para Term Preterm Abortions TAB SAB Ect Mult Living                  Review of Systems  Constitutional: Negative for fever.  HENT: Positive for sore throat.   Eyes: Negative for visual disturbance.  Respiratory: Positive for cough. Negative for shortness of breath.   Cardiovascular: Positive for chest pain (soreness).  Gastrointestinal: Negative for nausea, vomiting, abdominal pain and diarrhea.  Genitourinary: Negative for dysuria and hematuria.  Musculoskeletal: Positive for myalgias and joint swelling.  Skin: Negative for rash.   Neurological: Positive for headaches. Negative for dizziness.  Hematological: Does not bruise/bleed easily.  Psychiatric/Behavioral: Negative for confusion.    Allergies  Review of patient's allergies indicates no known allergies.  Home Medications   Current Outpatient Rx  Name  Route  Sig  Dispense  Refill  . HYDROcodone-acetaminophen (NORCO) 5-325 MG per tablet   Oral   Take 2 tablets by mouth every 6 (six) hours as needed for pain.   15 tablet   0   . levothyroxine (SYNTHROID, LEVOTHROID) 25 MCG tablet   Oral   Take 25 mcg by mouth daily.         Marland Kitchen albuterol (VENTOLIN HFA) 108 (90 BASE) MCG/ACT inhaler   Inhalation   Inhale 2 puffs into the lungs every 6 (six) hours as needed for wheezing or shortness of breath.         . naproxen (NAPROSYN) 500 MG tablet   Oral   Take 1 tablet (500 mg total) by mouth 2 (two) times daily.   14 tablet   0     BP 137/89  Pulse 88  Temp(Src) 98.6 F (37 C) (Oral)  Resp 16  Ht 5\' 3"  (1.6 m)  Wt 250 lb (113.399 kg)  BMI 44.3 kg/m2  SpO2 96%  LMP 06/13/2012  Physical Exam  Nursing note and vitals reviewed. Constitutional: She is oriented to person, place, and time.  She appears well-developed and well-nourished. No distress.  HENT:  Head: Normocephalic and atraumatic.  Mouth/Throat: Uvula is midline and oropharynx is clear and moist. No oropharyngeal exudate.  Eyes: EOM are normal.  Neck: Neck supple. No tracheal deviation present.  Cardiovascular: Normal rate, regular rhythm and normal heart sounds.   No murmur heard. Pulmonary/Chest: Effort normal and breath sounds normal. No respiratory distress. She has no wheezes. She has no rales.  Abdominal: Soft. Bowel sounds are normal. There is no tenderness.  Musculoskeletal: Normal range of motion.  Lymphadenopathy:    She has no cervical adenopathy.  Neurological: She is alert and oriented to person, place, and time.  Skin: Skin is warm and dry.  Psychiatric: She has a  normal mood and affect. Her behavior is normal.    ED Course  Procedures (including critical care time)  DIAGNOSTIC STUDIES: Oxygen Saturation is 96% on RA, adequate by my interpretation.    COORDINATION OF CARE: 7:50 AM Discussed treatment plan with pt at bedside and pt agreed to plan.     Labs Reviewed - No data to display Dg Chest 2 View  07/04/2012   *RADIOLOGY REPORT*  Clinical Data: Chest pain  CHEST - 2 VIEW  Comparison:  June 27, 2012  Findings:  Most of the patchy infiltrate has cleared from the right base.  There is mild subsegmental atelectasis remaining in the right base.  Elsewhere, lungs are clear.  Heart size and pulmonary vascularity are normal.  No adenopathy.  No pneumothorax.  No bone lesions.  IMPRESSION: Significant clearing from the right base since recent prior study. Patchy mild atelectasis remains in this area.  Elsewhere, lungs are clear.   Original Report Authenticated By: Bretta Bang, M.D.   Medications - No data to display    1. CAP (community acquired pneumonia)       MDM  Chest x-ray shows a pneumonia has essentially resolved. Patient subjectively talks about swelling to the left side of the jaw clinically on examination no adenopathy no significant swelling. Tonsils but no exudate uvula is midline no evidence of a peritonsillar abscess at this point in time. Patient's symptoms may be in part due to a viral illness and then developing pneumonia which has resolved also no wheezing no respiratory distress. The patient will be discharged home with precautions to return if anything newer worse develops. She'll continue to finish out her Keflex. Patient is already finished her Z-Pak.  I personally performed the services described in this documentation, which was scribed in my presence. The recorded information has been reviewed and is accurate.    Shelda Jakes, MD 07/04/12 [phone]

## 2012-10-17 ENCOUNTER — Emergency Department (HOSPITAL_COMMUNITY)
Admission: EM | Admit: 2012-10-17 | Discharge: 2012-10-17 | Disposition: A | Discharge status: Home / Self Care | Payer: Medicaid Other | Attending: Emergency Medicine | Admitting: Emergency Medicine

## 2012-10-17 ENCOUNTER — Encounter (HOSPITAL_COMMUNITY): Payer: Self-pay | Admitting: *Deleted

## 2012-10-17 DIAGNOSIS — J45909 Unspecified asthma, uncomplicated: Secondary | ICD-10-CM | POA: Insufficient documentation

## 2012-10-17 DIAGNOSIS — Z79899 Other long term (current) drug therapy: Secondary | ICD-10-CM | POA: Insufficient documentation

## 2012-10-17 DIAGNOSIS — M722 Plantar fascial fibromatosis: Secondary | ICD-10-CM | POA: Insufficient documentation

## 2012-10-17 DIAGNOSIS — F17200 Nicotine dependence, unspecified, uncomplicated: Secondary | ICD-10-CM | POA: Insufficient documentation

## 2012-10-17 LAB — GLUCOSE, CAPILLARY: Glucose-Capillary: 82 mg/dL (ref 70–99)

## 2012-10-17 MED ORDER — HYDROCODONE-ACETAMINOPHEN 5-325 MG PO TABS: 1.0000 | ORAL_TABLET | ORAL | Status: DC | PRN

## 2012-10-17 MED ORDER — IBUPROFEN 800 MG PO TABS: 800.0000 mg | ORAL_TABLET | Freq: Three times a day (TID) | ORAL | Status: DC

## 2012-10-17 MED ORDER — HYDROCODONE-ACETAMINOPHEN 5-325 MG PO TABS
1.0000 | ORAL_TABLET | Freq: Once | ORAL | Status: AC
  Administered 2012-10-17: 1 via ORAL
  Filled 2012-10-17: qty 1

## 2012-10-17 NOTE — ED Notes (Signed)
Bi-lateral feet pain for 3 months.  No known injury

## 2012-10-17 NOTE — ED Notes (Addendum)
Pt and her coworker state that her coworker is driving pt home. Dangers of driving after taking pain medication were discussed.

## 2012-10-17 NOTE — ED Notes (Signed)
Pt states that both of her feet have been hurting for 3 months. She saw a Dr and the Dr told her that she had tendonitis. She states that she got a shot but it did not help.

## 2012-10-17 NOTE — ED Provider Notes (Signed)
CSN: [id number]     Arrival date & time 10/17/12  1948 History   First MD Initiated Contact with Patient 10/17/12 2049     Chief Complaint  Patient presents with  . bi-lateral feet pain    (Consider location/radiation/quality/duration/timing/severity/associated sxs/prior Treatment) HPI History provided by pt.   Pt presents w/ bilateral plantar foot pain for the past 3 months.  Pain constant but worst in am upon waking and with bearing weight.  Has been applying ice without relief.  No associated fever, rash, paresthesias.  Has chronic, intermittent and stable left low back pain.  Denies injury to feet/back.  Works on her feet on concrete floor every day at work.  Past Medical History  Diagnosis Date  . Asthma    History reviewed. No pertinent past surgical history. No family history on file. History  Substance Use Topics  . Smoking status: Current Every Day Smoker -- 1.00 packs/day  . Smokeless tobacco: Not on file  . Alcohol Use: Yes     Comment: Social drinker    OB History   Grav Para Term Preterm Abortions TAB SAB Ect Mult Living                 Review of Systems  All other systems reviewed and are negative.    Allergies  Review of patient's allergies indicates no known allergies.  Home Medications   Current Outpatient Rx  Name  Route  Sig  Dispense  Refill  . albuterol (VENTOLIN HFA) 108 (90 BASE) MCG/ACT inhaler   Inhalation   Inhale 2 puffs into the lungs every 6 (six) hours as needed for wheezing or shortness of breath.         . levothyroxine (SYNTHROID, LEVOTHROID) 25 MCG tablet   Oral   Take 25 mcg by mouth daily.         Marland Kitchen OVER THE COUNTER MEDICATION   Oral   Take 1 tablet by mouth daily. Vitamin         . HYDROcodone-acetaminophen (NORCO/VICODIN) 5-325 MG per tablet   Oral   Take 1 tablet by mouth every 4 (four) hours as needed for pain.   20 tablet   0   . ibuprofen (ADVIL,MOTRIN) 800 MG tablet   Oral   Take 1 tablet (800 mg total) by  mouth 3 (three) times daily.   42 tablet   0    BP 121/76  Pulse 95  Temp(Src) 97.7 F (36.5 C) (Oral)  Resp 16  SpO2 99%  LMP 09/17/2012 Physical Exam  Nursing note and vitals reviewed. Constitutional: She is oriented to person, place, and time. She appears well-developed and well-nourished. No distress.  HENT:  Head: Normocephalic and atraumatic.  Eyes:  Normal appearance  Neck: Normal range of motion.  Pulmonary/Chest: Effort normal.  Musculoskeletal: Normal range of motion.  Pes planus bilaterally.  No skin changes.  Tenderness of heel and up to forefoot bilaterally.  Pain w/ stretching of foot.  2+ DP pulses and distal sensation intact.   Neurological: She is alert and oriented to person, place, and time.  Psychiatric: She has a normal mood and affect. Her behavior is normal.    ED Course  Procedures (including critical care time) Labs Review Labs Reviewed - No data to display Imaging Review No results found.  MDM   1. Plantar fasciitis, bilateral    Obese but otherwise healthy 32yo F presents w/ bilateral foot pain.  History and exam most consistent w/ plantar fasciitis.  Prescribed vicodin and 800mg  ibuprofen.  Recommended wearing supportive shoes at all times, icing and resting when possible.  Referred to ortho for persistent/worsening sx.  Return precautions discussed.  9:43 PM     Otilio Miu, PA-C 10/17/12 2143

## 2012-10-18 NOTE — ED Provider Notes (Signed)
Medical screening examination/treatment/procedure(s) were performed by non-physician practitioner and as supervising physician I was immediately available for consultation/collaboration.  Jamire Shabazz, MD 10/18/12 0230 

## 2012-12-26 ENCOUNTER — Encounter (HOSPITAL_COMMUNITY): Payer: Self-pay | Admitting: Emergency Medicine

## 2012-12-26 ENCOUNTER — Emergency Department (HOSPITAL_COMMUNITY)
Admission: EM | Admit: 2012-12-26 | Discharge: 2012-12-27 | Disposition: A | Discharge status: Home / Self Care | Payer: Medicaid Other | Attending: Emergency Medicine | Admitting: Emergency Medicine

## 2012-12-26 DIAGNOSIS — J45909 Unspecified asthma, uncomplicated: Secondary | ICD-10-CM | POA: Insufficient documentation

## 2012-12-26 DIAGNOSIS — E079 Disorder of thyroid, unspecified: Secondary | ICD-10-CM | POA: Insufficient documentation

## 2012-12-26 DIAGNOSIS — Z79899 Other long term (current) drug therapy: Secondary | ICD-10-CM | POA: Insufficient documentation

## 2012-12-26 DIAGNOSIS — R52 Pain, unspecified: Secondary | ICD-10-CM | POA: Insufficient documentation

## 2012-12-26 DIAGNOSIS — F17200 Nicotine dependence, unspecified, uncomplicated: Secondary | ICD-10-CM | POA: Insufficient documentation

## 2012-12-26 DIAGNOSIS — Z792 Long term (current) use of antibiotics: Secondary | ICD-10-CM | POA: Insufficient documentation

## 2012-12-26 DIAGNOSIS — J069 Acute upper respiratory infection, unspecified: Secondary | ICD-10-CM | POA: Insufficient documentation

## 2012-12-26 HISTORY — DX: Disorder of thyroid, unspecified: E07.9

## 2012-12-26 MED ORDER — BENZONATATE 100 MG PO CAPS: 100.0000 mg | ORAL_CAPSULE | Freq: Three times a day (TID) | ORAL | Status: DC

## 2012-12-26 MED ORDER — AZITHROMYCIN 250 MG PO TABS: 250.0000 mg | ORAL_TABLET | Freq: Every day | ORAL | Status: DC

## 2012-12-26 NOTE — ED Notes (Signed)
Cough , chest hurts when coughs.  Sore throat.  Body aches

## 2012-12-26 NOTE — ED Provider Notes (Signed)
CSN: [id number]     Arrival date & time 12/26/12  2248 History  This chart was scribed for Vida Roller, MD by Quintella Reichert, ED scribe.  This patient was seen in room APA01/APA01 and the patient's care was started at 11:39 PM.   Chief Complaint  Patient presents with  . Cough    The history is provided by the patient. No language interpreter was used.    HPI Comments: [name] is a 32 y.o. female with h/o asthma and thyroid disease who presents to the Emergency Department complaining of 3 days of persistent, worsening URI symptoms including cough, congestion, generalized myalgias, sore throat, congestion, and chills.  Pt states she initially developed a sore throat and other symptoms developed later.  Cough is productive of thick sputum but she has not seen the color.  She denies dysuria, ear pain, or measured fever.  She has attempted to treat symptoms with OTC cold medications without relief.  She has not used her albuterol inhaler recently.  Symptoms are persistent, gradually worsening and not associated with fluids   Past Medical History  Diagnosis Date  . Asthma   . Thyroid disease     History reviewed. No pertinent past surgical history.  History reviewed. No pertinent family history.   History  Substance Use Topics  . Smoking status: Current Every Day Smoker -- 1.00 packs/day  . Smokeless tobacco: Not on file  . Alcohol Use: Yes     Comment: Social drinker     OB History   Grav Para Term Preterm Abortions TAB SAB Ect Mult Living                  Review of Systems  All other systems reviewed and are negative.     Allergies  Review of patient's allergies indicates no known allergies.  Home Medications   Current Outpatient Rx  Name  Route  Sig  Dispense  Refill  . albuterol (VENTOLIN HFA) 108 (90 BASE) MCG/ACT inhaler   Inhalation   Inhale 2 puffs into the lungs every 6 (six) hours as needed for wheezing or shortness of breath.          Marland Kitchen azithromycin (ZITHROMAX Z-PAK) 250 MG tablet   Oral   Take 1 tablet (250 mg total) by mouth daily. 500mg  PO day 1, then 250mg  PO days 205   6 tablet   0   . benzonatate (TESSALON) 100 MG capsule   Oral   Take 1 capsule (100 mg total) by mouth every 8 (eight) hours.   21 capsule   0   . HYDROcodone-acetaminophen (NORCO/VICODIN) 5-325 MG per tablet   Oral   Take 1 tablet by mouth every 4 (four) hours as needed for pain.   20 tablet   0   . ibuprofen (ADVIL,MOTRIN) 800 MG tablet   Oral   Take 1 tablet (800 mg total) by mouth 3 (three) times daily.   42 tablet   0   . levothyroxine (SYNTHROID, LEVOTHROID) 25 MCG tablet   Oral   Take 25 mcg by mouth daily.         Marland Kitchen OVER THE COUNTER MEDICATION   Oral   Take 1 tablet by mouth daily. Vitamin          BP 144/79  Pulse 80  Temp(Src) 98.3 F (36.8 C) (Oral)  Resp 18  Ht 5\' 3"  (1.6 m)  Wt 250 lb (113.399 kg)  BMI 44.30 kg/m2  SpO2 100%  LMP 12/24/2012  Physical Exam  Nursing note and vitals reviewed. Constitutional: She is oriented to person, place, and time. She appears well-developed and well-nourished. No distress.  HENT:  Head: Normocephalic and atraumatic.  Right Ear: Tympanic membrane normal.  Left Ear: Tympanic membrane normal.  Nose: Nose normal.  Mouth/Throat: Posterior oropharyngeal erythema present. No oropharyngeal exudate.  Eyes: Conjunctivae are normal. Pupils are equal, round, and reactive to light. No scleral icterus.  Neck: Neck supple.  Cardiovascular: Normal rate, regular rhythm, normal heart sounds and intact distal pulses.   No murmur heard. Pulmonary/Chest: Effort normal and breath sounds normal. No stridor. No respiratory distress. She has no wheezes. She has no rales.  Musculoskeletal: Normal range of motion.  Neurological: She is alert and oriented to person, place, and time.  Skin: Skin is warm and dry. No rash noted.  Psychiatric: She has a normal mood and affect. Her behavior is  normal.    ED Course  Procedures (including critical care time)  DIAGNOSTIC STUDIES: Oxygen Saturation is 100% on room air, normal by my interpretation.    COORDINATION OF CARE: 11:43 PM-Discussed treatment plan which includes antibiotics with pt at bedside and pt agreed to plan.    Labs Review Labs Reviewed - No data to display  Imaging Review No results found.  EKG Interpretation   None       MDM   1. URI (upper respiratory infection)    Well appaering - persistent cough, productive, instructed to use her nebs ab home.   Meds given in ED:  Medications - No data to display  New Prescriptions   AZITHROMYCIN (ZITHROMAX Z-PAK) 250 MG TABLET    Take 1 tablet (250 mg total) by mouth daily. 500mg  PO day 1, then 250mg  PO days 205   BENZONATATE (TESSALON) 100 MG CAPSULE    Take 1 capsule (100 mg total) by mouth every 8 (eight) hours.        I personally performed the services described in this documentation, which was scribed in my presence. The recorded information has been reviewed and is accurate.      Vida Roller, MD 12/26/12 [phone]

## 2013-03-26 DIAGNOSIS — R209 Unspecified disturbances of skin sensation: Secondary | ICD-10-CM | POA: Insufficient documentation

## 2013-03-26 DIAGNOSIS — M25579 Pain in unspecified ankle and joints of unspecified foot: Secondary | ICD-10-CM | POA: Insufficient documentation

## 2013-03-26 DIAGNOSIS — F17200 Nicotine dependence, unspecified, uncomplicated: Secondary | ICD-10-CM | POA: Insufficient documentation

## 2013-03-27 ENCOUNTER — Encounter (HOSPITAL_COMMUNITY): Payer: Self-pay | Admitting: Emergency Medicine

## 2013-03-27 ENCOUNTER — Emergency Department (HOSPITAL_COMMUNITY): Payer: Medicaid Other

## 2013-03-27 ENCOUNTER — Emergency Department (HOSPITAL_COMMUNITY)
Admission: EM | Admit: 2013-03-27 | Discharge: 2013-03-27 | Disposition: A | Discharge status: Home / Self Care | Payer: Medicaid Other | Attending: Emergency Medicine | Admitting: Emergency Medicine

## 2013-03-27 ENCOUNTER — Emergency Department (HOSPITAL_COMMUNITY)
Admission: EM | Admit: 2013-03-27 | Discharge: 2013-03-27 | Discharge status: Against Medical Advice | Payer: Medicaid Other | Attending: Emergency Medicine | Admitting: Emergency Medicine

## 2013-03-27 DIAGNOSIS — Z862 Personal history of diseases of the blood and blood-forming organs and certain disorders involving the immune mechanism: Secondary | ICD-10-CM | POA: Insufficient documentation

## 2013-03-27 DIAGNOSIS — M2142 Flat foot [pes planus] (acquired), left foot: Secondary | ICD-10-CM

## 2013-03-27 DIAGNOSIS — Z8639 Personal history of other endocrine, nutritional and metabolic disease: Secondary | ICD-10-CM | POA: Insufficient documentation

## 2013-03-27 DIAGNOSIS — J45909 Unspecified asthma, uncomplicated: Secondary | ICD-10-CM | POA: Insufficient documentation

## 2013-03-27 DIAGNOSIS — F17200 Nicotine dependence, unspecified, uncomplicated: Secondary | ICD-10-CM | POA: Insufficient documentation

## 2013-03-27 DIAGNOSIS — M2141 Flat foot [pes planus] (acquired), right foot: Secondary | ICD-10-CM

## 2013-03-27 DIAGNOSIS — G8929 Other chronic pain: Secondary | ICD-10-CM | POA: Insufficient documentation

## 2013-03-27 DIAGNOSIS — M2140 Flat foot [pes planus] (acquired), unspecified foot: Secondary | ICD-10-CM | POA: Insufficient documentation

## 2013-03-27 DIAGNOSIS — Z79899 Other long term (current) drug therapy: Secondary | ICD-10-CM | POA: Insufficient documentation

## 2013-03-27 LAB — CBG MONITORING, ED: Glucose-Capillary: 81 mg/dL (ref 70–99)

## 2013-03-27 NOTE — ED Notes (Signed)
Patient stated she wanted to leave. RN asked patient to stay and he would check with MD about when they were going to be seen. Patient refused and said they would come back tomorrow at a different time. RN explained the risks of leaving without being seen. Patient stated she still wanted to leave.

## 2013-03-27 NOTE — Discharge Instructions (Signed)
Increase your  ibuprofen to 600 mg which would equal 3 tablets 3 times daily with food.  You may also add tramadol for added pain relief, although this medicine may make you drowsy.

## 2013-03-27 NOTE — ED Notes (Addendum)
Pt states pain that radiates up from her feet. Bilateral leg pain stops at knees as well. Pt states burning/tingling pain for over 2 weeks. Pt ambulatory.

## 2013-03-27 NOTE — ED Provider Notes (Signed)
  Medical screening examination/treatment/procedure(s) were performed by non-physician practitioner and as supervising physician I was immediately available for consultation/collaboration.   Gerhard Munchobert Keisy Strickler, MD 03/27/13 1902

## 2013-03-27 NOTE — ED Notes (Signed)
Pain both feet with numbness  In legs.  For 2 weeks Normal gait to triage

## 2013-03-27 NOTE — ED Provider Notes (Signed)
CSN: [id number]     Arrival date & time 03/27/13  1413 History   First MD Initiated Contact with Patient 03/27/13 1617     Chief Complaint  Patient presents with  . Foot Pain     (Consider location/radiation/quality/duration/timing/severity/associated sxs/prior Treatment) HPI Comments: [name] is a 33 y.o. Female with complaints of chronic pain in her bilateral feet which has been worse over the past several weeks.  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  She also notices severe increased pain after work breaks when she first stands up and walks a few steps.  She also has numbness across the bottom of her feet which is intermittent.  She has switched from a steel to work but to a shoe about 6 months ago which have not improved her symptoms.  She has taken ibuprofen without any symptom relief.  There is no radiation of pain.  She denies injury.     The history is provided by the patient.    Past Medical History  Diagnosis Date  . Asthma   . Thyroid disease    History reviewed. No pertinent past surgical history. History reviewed. No pertinent family history. History  Substance Use Topics  . Smoking status: Current Every Day Smoker -- 1.00 packs/day  . Smokeless tobacco: Not on file  . Alcohol Use: Yes     Comment: Social drinker    OB History   Grav Para Term Preterm Abortions TAB SAB Ect Mult Living                 Review of Systems  Constitutional: Negative for fever.  Musculoskeletal: Positive for arthralgias. Negative for joint swelling and myalgias.  Neurological: Negative for weakness and numbness.      Allergies  Review of patient's allergies indicates no known allergies.  Home Medications   Current Outpatient Rx  Name  Route  Sig  Dispense  Refill  . albuterol (VENTOLIN HFA) 108 (90 BASE) MCG/ACT inhaler   Inhalation   Inhale 2 puffs into the lungs every 6 (six) hours as  needed for wheezing or shortness of breath.         Marland Kitchen. ibuprofen (ADVIL,MOTRIN) 200 MG tablet   Oral   Take 400 mg by mouth every 6 (six) hours as needed.          BP 142/80  Pulse 72  Temp(Src) 98.3 F (36.8 C) (Oral)  Resp 20  SpO2 99%  LMP 03/01/2013 Physical Exam  Constitutional: She appears well-developed and well-nourished.  HENT:  Head: Atraumatic.  Neck: Normal range of motion.  Cardiovascular:  Pulses equal bilaterally  Musculoskeletal: She exhibits tenderness.  Bilateral feet look normal in appearance except for several nails with fungal infection.  She has dorsalis pedis pulses.  Cap refill in her toes is less than 3 seconds.  She has no point tenderness to palpation in the major joints.  She's increased pain with weight bearing and has moderate pes planus.  Neurological: She is alert. She has normal strength. She displays normal reflexes. No sensory deficit.  Skin: Skin is warm and dry.  Psychiatric: She has a normal mood and affect.    ED Course  Procedures (including critical care time) Labs Review Labs Reviewed  CBG MONITORING, ED   Imaging Review Dg Foot Complete Left  03/27/2013   CLINICAL DATA:  Left foot pain  EXAM: LEFT FOOT - COMPLETE  3+ VIEW  COMPARISON:  None.  FINDINGS: There is no evidence of fracture or dislocation. There is no evidence of arthropathy or other focal bone abnormality. Soft tissues are unremarkable. The plantar arch is somewhat flattened.  IMPRESSION: No acute abnormality noted.   Electronically Signed   By: Alcide Clever M.D.   On: 03/27/2013 18:06     EKG Interpretation None      MDM   Final diagnoses:  Pes planus of both feet    Patients labs and/or radiological studies were viewed and considered during the medical decision making and disposition process. Pt was encouraged to try orthotics in her shoes, but was also referred to a podiatrist for further management of this condition.  She was encouraged to increase her  ibuprofen 600 mg 3 times a day.  She was prescribed tramadol, but caution regarding sedation.  The patient appears reasonably screened and/or stabilized for discharge and I doubt any other medical condition or other [facility] requiring further screening, evaluation, or treatment in the ED at this time prior to discharge.     Burgess Amor, PA-C 03/27/13 1843

## 2013-04-08 ENCOUNTER — Encounter: Payer: Self-pay | Admitting: Obstetrics & Gynecology

## 2013-04-20 ENCOUNTER — Encounter (HOSPITAL_COMMUNITY): Payer: Self-pay | Admitting: Emergency Medicine

## 2013-04-20 ENCOUNTER — Emergency Department (HOSPITAL_COMMUNITY)
Admission: EM | Admit: 2013-04-20 | Discharge: 2013-04-20 | Disposition: A | Discharge status: Home / Self Care | Payer: Medicaid Other | Attending: Emergency Medicine | Admitting: Emergency Medicine

## 2013-04-20 DIAGNOSIS — J45901 Unspecified asthma with (acute) exacerbation: Secondary | ICD-10-CM | POA: Insufficient documentation

## 2013-04-20 DIAGNOSIS — R339 Retention of urine, unspecified: Secondary | ICD-10-CM | POA: Insufficient documentation

## 2013-04-20 DIAGNOSIS — Z79899 Other long term (current) drug therapy: Secondary | ICD-10-CM | POA: Insufficient documentation

## 2013-04-20 DIAGNOSIS — IMO0002 Reserved for concepts with insufficient information to code with codable children: Secondary | ICD-10-CM | POA: Insufficient documentation

## 2013-04-20 DIAGNOSIS — T7840XA Allergy, unspecified, initial encounter: Secondary | ICD-10-CM

## 2013-04-20 DIAGNOSIS — Z8639 Personal history of other endocrine, nutritional and metabolic disease: Secondary | ICD-10-CM | POA: Insufficient documentation

## 2013-04-20 DIAGNOSIS — Z862 Personal history of diseases of the blood and blood-forming organs and certain disorders involving the immune mechanism: Secondary | ICD-10-CM | POA: Insufficient documentation

## 2013-04-20 DIAGNOSIS — I10 Essential (primary) hypertension: Secondary | ICD-10-CM | POA: Insufficient documentation

## 2013-04-20 DIAGNOSIS — R110 Nausea: Secondary | ICD-10-CM | POA: Insufficient documentation

## 2013-04-20 MED ORDER — PREDNISONE 20 MG PO TABS: 40.0000 mg | ORAL_TABLET | Freq: Every day | ORAL | Status: DC

## 2013-04-20 MED ORDER — METHYLPREDNISOLONE SODIUM SUCC 125 MG IJ SOLR
125.0000 mg | Freq: Once | INTRAMUSCULAR | Status: AC
  Administered 2013-04-20: 125 mg via INTRAMUSCULAR
  Filled 2013-04-20: qty 2

## 2013-04-20 MED ORDER — DIPHENHYDRAMINE HCL 50 MG/ML IJ SOLN
25.0000 mg | Freq: Once | INTRAMUSCULAR | Status: AC
  Administered 2013-04-20: 25 mg via INTRAMUSCULAR
  Filled 2013-04-20: qty 1

## 2013-04-20 MED ORDER — DIPHENHYDRAMINE HCL 25 MG PO TABS: 25.0000 mg | ORAL_TABLET | Freq: Four times a day (QID) | ORAL | Status: DC | PRN

## 2013-04-20 NOTE — Discharge Instructions (Signed)
Please follow up with your primary care physician in 1-2 days. If you do not have one please call the [facility] number listed above. Please take medications as prescribed. If you use your EpiPen please return immediately to the ER. Please read all discharge instructions and return precautions.   Anaphylactic Reaction An anaphylactic reaction is a sudden, severe allergic reaction that involves the whole body. It can be life threatening. A hospital stay is often required. People with asthma, eczema, or hay fever are slightly more likely to have an anaphylactic reaction. CAUSES  An anaphylactic reaction may be caused by anything to which you are allergic. After being exposed to the allergic substance, your immune system becomes sensitized to it. When you are exposed to that allergic substance again, an allergic reaction can occur. Common causes of an anaphylactic reaction include:  Medicines.  Foods, especially peanuts, wheat, shellfish, milk, and eggs.  Insect bites or stings.  Blood products.  Chemicals, such as dyes, latex, and contrast material used for imaging tests. SYMPTOMS  When an allergic reaction occurs, the body releases histamine and other substances. These substances cause symptoms such as tightening of the airway. Symptoms often develop within seconds or minutes of exposure. Symptoms may include:  Skin rash or hives.  Itching.  Chest tightness.  Swelling of the eyes, tongue, or lips.  Trouble breathing or swallowing.  Lightheadedness or fainting.  Anxiety or confusion.  Stomach pains, vomiting, or diarrhea.  Nasal congestion.  A fast or irregular heartbeat (palpitations). DIAGNOSIS  Diagnosis is based on your history of recent exposure to allergic substances, your symptoms, and a physical exam. Your caregiver may also perform blood or urine tests to confirm the diagnosis. TREATMENT  Epinephrine medicine is the main treatment for an  anaphylactic reaction. Other medicines that may be used for treatment include antihistamines, steroids, and albuterol. In severe cases, fluids and medicine to support blood pressure may be given through an intravenous line (IV). Even if you improve after treatment, you need to be observed to make sure your condition does not get worse. This may require a stay in the hospital. [place] a medical alert bracelet or necklace stating your allergy.  You and your family must learn how to use an anaphylaxis kit or give an epinephrine injection to temporarily treat an emergency allergic reaction. Always carry your epinephrine injection or anaphylaxis kit with you. This can be lifesaving if you have a severe reaction.  Do not drive or perform tasks after treatment until the medicines used to treat your reaction have worn off, or until your caregiver says it is okay.  If you have hives or a rash:  Take medicines as directed by your caregiver.  You may use an over-the-counter antihistamine (diphenhydramine) as needed.  Apply cold compresses to the skin or take baths in cool water. Avoid hot baths or showers. SEEK MEDICAL CARE IF:   You develop symptoms of an allergic reaction to a new substance. Symptoms may start right away or minutes later.  You develop a rash, hives, or itching.  You develop new symptoms. SEEK IMMEDIATE MEDICAL CARE IF:   You have swelling of the mouth, difficulty breathing, or wheezing.  You have a tight feeling in your chest or throat.  You develop hives, swelling, or itching all over your body.  You develop severe vomiting or diarrhea.  You feel faint or pass out. This is an emergency. Use your epinephrine injection or anaphylaxis  kit as you have been instructed. Call your local emergency services (911 in [place] Even if you improve after the injection, you need to be examined at a hospital emergency department. MAKE SURE YOU:   Understand these  instructions.  Will watch your condition.  Will get help right away if you are not doing well or get worse. Document Released: 01/01/2005 Document Revised: 07/03/2011 Document Reviewed: 04/04/2011 [facility] Patient Information 2014 [place], [place].

## 2013-04-20 NOTE — ED Provider Notes (Signed)
CSN: [id number]     Arrival date & time 04/20/13  1702 History   First MD Initiated Contact with Patient 04/20/13 1727     This chart was scribed for non-physician practitioner, Francee PiccoloJennifer Karel Turpen PA-C working with Nelia Shiobert L Beaton, MD by Arlan OrganAshley Leger, ED Scribe. This patient was seen in room WTR9/WTR9 and the patient's care was started at 6:30PM.   Chief Complaint  Patient presents with  . Allergic Reaction   The history is provided by the patient. No language interpreter was used.    HPI Comments: [name] is a 33 y.o. female who presents to the Emergency Department complaining of a possible allergic reaction x 2 hours that has somewhat resolved. Pt states she was experiencing nausea, and difficultly swallowing and breathing. Unaware of what could have triggered this reaction, however, she reports numerous known environmental allergies. Pt states she took a dose of OTC Claritin this morning, but denies taking anything after onset of symptoms. She currently has a prescription for an Epipen, but denies ever having to us it for previous allergic reactions. Currently she states her symptoms have improved. States she is still experiencing some mild difficulty swallowing and ongoing nausea. At this time she denies any vomiting, fever, or chills. She has a PMHx of Asthma and thyroid disease. No other concerns this visit.  Past Medical History  Diagnosis Date  . Asthma   . Thyroid disease    History reviewed. No pertinent past surgical history. No family history on file. History  Substance Use Topics  . Smoking status: Current Every Day Smoker -- 1.00 packs/day  . Smokeless tobacco: Not on file  . Alcohol Use: Yes     Comment: Social drinker    OB History   Grav Para Term Preterm Abortions TAB SAB Ect Mult Living                 Review of Systems  Constitutional: Negative for fever and chills.  HENT: Positive for trouble swallowing. Negative for congestion.   Eyes: Negative for  redness.  Respiratory: Positive for shortness of breath (Difficulty breathing). Negative for cough.   Gastrointestinal: Positive for nausea. Negative for vomiting.  Skin: Negative for rash.  Psychiatric/Behavioral: Negative for confusion.  All other systems reviewed and are negative.      Allergies  Review of patient's allergies indicates no known allergies.  Home Medications   Current Outpatient Rx  Name  Route  Sig  Dispense  Refill  . albuterol (VENTOLIN HFA) 108 (90 BASE) MCG/ACT inhaler   Inhalation   Inhale 2 puffs into the lungs every 6 (six) hours as needed for wheezing or shortness of breath.         Marland Kitchen. ibuprofen (ADVIL,MOTRIN) 200 MG tablet   Oral   Take 400 mg by mouth every 6 (six) hours as needed for moderate pain.          Marland Kitchen. loratadine (CLARITIN) 10 MG tablet   Oral   Take 10 mg by mouth daily.         Marland Kitchen. PRESCRIPTION MEDICATION   Subcutaneous   Inject into the skin 2 (two) times a week. Allergy shots on Tuesdays and Thursdays. Pt is giving herself shots at home.Pt started taking these allergy shots on 04/15/13         . diphenhydrAMINE (BENADRYL) 25 MG tablet   Oral   Take 1 tablet (25 mg total) by mouth every 6 (six) hours as needed for itching (Rash).  30 tablet   0   . predniSONE (DELTASONE) 20 MG tablet   Oral   Take 2 tablets (40 mg total) by mouth daily.   10 tablet   0    Triage Vitals: BP 139/79  Pulse 94  Temp(Src) 98.3 F (36.8 C) (Oral)  Resp 18  SpO2 96%  LMP 03/08/2013   Physical Exam  Nursing note and vitals reviewed. Constitutional: She is oriented to person, place, and time. She appears well-developed and well-nourished. No distress.  HENT:  Head: Normocephalic and atraumatic.  Right Ear: External ear normal.  Left Ear: External ear normal.  Nose: Nose normal.  Mouth/Throat: Uvula is midline, oropharynx is clear and moist and mucous membranes are normal. No uvula swelling. No oropharyngeal exudate, posterior  oropharyngeal edema or posterior oropharyngeal erythema.  No lip or tongue swelling  Eyes: Conjunctivae are normal.  Neck: Normal range of motion. Neck supple.  Cardiovascular: Normal rate, regular rhythm, normal heart sounds and intact distal pulses.   Pulmonary/Chest: Effort normal and breath sounds normal. No stridor. No respiratory distress. She has no wheezes.  Abdominal: Soft. There is no tenderness.  Musculoskeletal: Normal range of motion. She exhibits no edema.  Lymphadenopathy:    She has no cervical adenopathy.  Neurological: She is alert and oriented to person, place, and time.  Skin: Skin is warm and dry. No rash noted. She is not diaphoretic.  Psychiatric: She has a normal mood and affect.    ED Course  Procedures (including critical care time) Medications  methylPREDNISolone sodium succinate (SOLU-MEDROL) 125 mg/2 mL injection 125 mg (125 mg Intramuscular Given 04/20/13 1840)  diphenhydrAMINE (BENADRYL) injection 25 mg (25 mg Intramuscular Given 04/20/13 1843)     DIAGNOSTIC STUDIES: Oxygen Saturation is 96% on RA, Adequate by my interpretation.    COORDINATION OF CARE: 6:31 PM- Will give MethylPrednisone and Benadryl. Discussed treatment plan with pt at bedside and pt agreed to plan.     Labs Review Labs Reviewed - No data to display Imaging Review No results found.   EKG Interpretation None      MDM   Final diagnoses:  Allergic reaction    Filed Vitals:   04/20/13 1708  BP: 139/79  Pulse: 94  Temp: 98.3 F (36.8 C)  Resp: 18   Afebrile, NAD, non-toxic appearing, AAOx4.  Patient re-evaluated prior to dc, is hemodynamically stable, in no respiratory distress, and denies the feeling of throat closing. Pt has been advised to take OTC benadryl & return to the ED if they have a mod-severe allergic rxn (s/s including throat closing, difficulty breathing, swelling of lips face or tongue). Pt is to follow up with their PCP. Pt is agreeable with plan &  verbalizes understanding. Patient is stable at time of discharge    I personally performed the services described in this documentation, which was scribed in my presence. The recorded information has been reviewed and is accurate.      Jeannetta Ellis, PA-C 04/20/13 2027

## 2013-04-20 NOTE — ED Notes (Signed)
Pt states she can't breath, NAD O2 sat WDL, and its hard to swallow. Notced this around 1600. Started allergy shots on April 15 2013

## 2013-04-25 NOTE — ED Provider Notes (Signed)
Medical screening examination/treatment/procedure(s) were performed by non-physician practitioner and as supervising physician I was immediately available for consultation/collaboration.   Zissel Biederman L Quinzell Malcomb, MD 04/25/13 1123 

## 2013-04-30 ENCOUNTER — Ambulatory Visit (INDEPENDENT_AMBULATORY_CARE_PROVIDER_SITE_OTHER): Payer: No Typology Code available for payment source | Admitting: Obstetrics & Gynecology

## 2013-04-30 ENCOUNTER — Encounter: Payer: Self-pay | Admitting: Obstetrics & Gynecology

## 2013-04-30 VITALS — BP 130/84 | HR 90 | Temp 98.1°F | Ht 63.0 in | Wt 259.0 lb

## 2013-04-30 DIAGNOSIS — B977 Papillomavirus as the cause of diseases classified elsewhere: Secondary | ICD-10-CM

## 2013-04-30 DIAGNOSIS — R87810 Cervical high risk human papillomavirus (HPV) DNA test positive: Secondary | ICD-10-CM

## 2013-04-30 DIAGNOSIS — IMO0002 Reserved for concepts with insufficient information to code with codable children: Secondary | ICD-10-CM

## 2013-04-30 DIAGNOSIS — R6889 Other general symptoms and signs: Secondary | ICD-10-CM

## 2013-04-30 NOTE — Progress Notes (Signed)
Subjective:     [name] is a 33 y.o. female here for further evaluation of an abnormal pap.  A recent Pap smear had insufficient cytology and a positive HPV co-test.   The HPI was reviewed and explored in further detail by the provider. Gynecologic History Patient's last menstrual period was 04/05/2013. Contraception: none Last Pap: 2015. Results were: unsatisfactory for epithelial abnormality positive HPV Last mammogram: n/a Obstetric History OB History  Gravida Para Term Preterm AB SAB TAB Ectopic Multiple Living  0 0 0 0 0 0 0 0 0 0         Past Medical History  Diagnosis Date  . Asthma   . Thyroid disease     Past Surgical History  Procedure Laterality Date  . Extra digit removed      Current Outpatient Prescriptions  Medication Sig Dispense Refill  . albuterol (VENTOLIN HFA) 108 (90 BASE) MCG/ACT inhaler Inhale 2 puffs into the lungs every 6 (six) hours as needed for wheezing or shortness of breath.      . diphenhydrAMINE (BENADRYL) 25 MG tablet Take 1 tablet (25 mg total) by mouth every 6 (six) hours as needed for itching (Rash).  30 tablet  0  . ibuprofen (ADVIL,MOTRIN) 200 MG tablet Take 400 mg by mouth every 6 (six) hours as needed for moderate pain.       Marland Kitchen. loratadine (CLARITIN) 10 MG tablet Take 10 mg by mouth daily.      Marland Kitchen. PRESCRIPTION MEDICATION Inject into the skin 2 (two) times a week. Allergy shots on Tuesdays and Thursdays. Pt is giving herself shots at home.Pt started taking these allergy shots on 04/15/13       No current facility-administered medications for this visit.   No Known Allergies  History  Substance Use Topics  . Smoking status: Current Every Day Smoker -- 0.50 packs/day for 10 years    Types: Cigarettes  . Smokeless tobacco: Never Used  . Alcohol Use: Yes     Comment: Social drinker     Family History  Problem Relation Age of Onset  . Hypothyroidism Mother       Review of Systems  Constitutional: negative for fatigue and  weight loss Respiratory: negative for cough and wheezing Cardiovascular: negative for chest pain, fatigue and palpitations Gastrointestinal: negative for abdominal pain and change in bowel habits Musculoskeletal:negative for myalgias Neurological: negative for gait problems and tremors Behavioral/Psych: negative for abusive relationship, depression Endocrine: negative for temperature intolerance   Genitourinary:negative for abnormal menstrual periods, genital lesions, hot flashes, sexual problems and vaginal discharge Integument/breast: negative for breast lump, breast tenderness, nipple discharge and skin lesion(s)    Objective:       General:   alert  Skin:   no rash or abnormalities  Lungs:   clear to auscultation bilaterally  Heart:   regular rate and rhythm, S1, S2 normal, no murmur, click, rub or gallop  Breasts:   normal without suspicious masses, skin or nipple changes or axillary nodes  Abdomen:  normal findings: no organomegaly, soft, non-tender and no hernia  Pelvis:  External genitalia: normal general appearance Urinary system: urethral meatus normal and bladder without fullness, nontender Vaginal: normal without tenderness, induration or masses Cervix: normal appearance Adnexa: normal bimanual exam Uterus: anteverted and non-tender, normal size   Lab Review Urine pregnancy test Labs reviewed yes Radiologic studies reviewed no     Assessment:   Pap with positive HPV/insufficient cytology    Plan:   Pap repeated. If  cytology negative-->repeat Pap/HPV co-test in 1 yr Return prn

## 2013-05-03 DIAGNOSIS — N879 Dysplasia of cervix uteri, unspecified: Secondary | ICD-10-CM | POA: Insufficient documentation

## 2013-05-03 HISTORY — DX: Dysplasia of cervix uteri, unspecified: N87.9

## 2013-05-03 NOTE — Patient Instructions (Signed)
HPV Test The HPV (human papillomavirus) test is used to screen for high-risk types with HPV infection. HPV is a group of about 100 related viruses, of which 40 types are genital viruses. Most HPV viruses cause infections that usually resolve without treatment within 2 years. Some HPV infections can cause skin and genital warts (condylomata). HPV types 16, 18, 31 and 45 are considered high-risk types of HPV. High-risk types of HPV do not usually cause visible warts, but if untreated, may lead to cancers of the outlet of the womb (cervix) or anus. An HPV test identifies the DNA (genetic) strands of the HPV infection. Because the test identifies the DNA strands, the test is also referred to as the HPV DNA test. Although HPV is found in both males and females, the HPV test is only used to screen for cervical cancer in females. This test is recommended for females:  With an abnormal Pap test.  After treatment of an abnormal Pap test.  Aged 30 and older.  After treatment of a high-risk HPV infection. The HPV test may be done at the same time as a Pap test in females over the age of 30. Both the HPV and Pap test require a sample of cells from the cervix. PREPARATION FOR TEST  You may be asked to avoid douching, tampons, or vaginal medicines for 48 hours before the HPV test. You will be asked to urinate before the test. For the HPV test, you will need to lie on an exam table with your feet in stirrups. A spatula will be inserted into the vagina. The spatula will be used to swab the cervix for a cell and mucus sample. The sample will be further evaluated in a lab under a microscope. NORMAL FINDINGS  Normal: High-risk HPV is not found.  Ranges for normal findings may vary among different laboratories and hospitals. You should always check with your doctor after having lab work or other tests done to discuss the meaning of your test results and whether your values are considered within normal limits. MEANING  OF TEST An abnormal HPV test means that high-risk HPV is found. Your caregiver may recommend further testing. Your caregiver will go over the test results with you. He or she will and discuss the importance and meaning of your results, as well as treatment options and the need for additional tests, if necessary. OBTAINING THE RESULTS  It is your responsibility to obtain your test results. Ask the lab or department performing the test when and how you will get your results. Document Released: 01/27/2004 Document Revised: 03/26/2011 Document Reviewed: 10/11/2004 [employer] Patient Information 2014 [employer]  

## 2013-05-04 LAB — PAP IG (IMAGE GUIDED)

## 2013-06-28 ENCOUNTER — Encounter (HOSPITAL_BASED_OUTPATIENT_CLINIC_OR_DEPARTMENT_OTHER): Payer: No Typology Code available for payment source

## 2013-07-05 ENCOUNTER — Ambulatory Visit (HOSPITAL_BASED_OUTPATIENT_CLINIC_OR_DEPARTMENT_OTHER): Payer: No Typology Code available for payment source

## 2013-07-29 ENCOUNTER — Encounter (HOSPITAL_COMMUNITY): Payer: Self-pay | Admitting: Emergency Medicine

## 2013-07-29 ENCOUNTER — Emergency Department (HOSPITAL_COMMUNITY)
Admission: EM | Admit: 2013-07-29 | Discharge: 2013-07-29 | Disposition: A | Discharge status: Home / Self Care | Payer: No Typology Code available for payment source | Attending: Emergency Medicine | Admitting: Emergency Medicine

## 2013-07-29 DIAGNOSIS — H109 Unspecified conjunctivitis: Secondary | ICD-10-CM

## 2013-07-29 DIAGNOSIS — H1030 Unspecified acute conjunctivitis, unspecified eye: Secondary | ICD-10-CM | POA: Insufficient documentation

## 2013-07-29 DIAGNOSIS — F17200 Nicotine dependence, unspecified, uncomplicated: Secondary | ICD-10-CM | POA: Insufficient documentation

## 2013-07-29 DIAGNOSIS — J45909 Unspecified asthma, uncomplicated: Secondary | ICD-10-CM | POA: Insufficient documentation

## 2013-07-29 DIAGNOSIS — Z862 Personal history of diseases of the blood and blood-forming organs and certain disorders involving the immune mechanism: Secondary | ICD-10-CM | POA: Insufficient documentation

## 2013-07-29 DIAGNOSIS — Z9109 Other allergy status, other than to drugs and biological substances: Secondary | ICD-10-CM

## 2013-07-29 DIAGNOSIS — Z8639 Personal history of other endocrine, nutritional and metabolic disease: Secondary | ICD-10-CM | POA: Insufficient documentation

## 2013-07-29 DIAGNOSIS — J309 Allergic rhinitis, unspecified: Secondary | ICD-10-CM | POA: Insufficient documentation

## 2013-07-29 MED ORDER — ERYTHROMYCIN 5 MG/GM OP OINT: 1.0000 "application " | TOPICAL_OINTMENT | Freq: Four times a day (QID) | OPHTHALMIC | Status: DC

## 2013-07-29 MED ORDER — NAPHAZOLINE-PHENIRAMINE 0.027-0.315 % OP SOLN: 2.0000 [drp] | Freq: Four times a day (QID) | OPHTHALMIC | Status: DC | PRN

## 2013-07-29 NOTE — ED Provider Notes (Signed)
CSN: [id number]     Arrival date & time 07/29/13  1510 History   This chart was scribed for Michelle StraussMercedes Camprubi-Soms, PA-C working with [name], DO by Evon Slackerrance Branch, ED Scribe. This patient was seen in room TR04C/TR04C and the patient's care was started at 4:50 PM.   Chief Complaint  Patient presents with  . Eye Drainage   HPI Comments: [name] is a 33 y.o. female with a past medical history of allergies who presents to the Emergency Department complaining of b/l eye itching and pain onset 3 days. She states the pain first started in her right eye and went to her left eye. She states that she has associated clear eye discharge, eye itching, eye redness, and some eye lid swelling which subsided. She states she hasn't  been around anyone with similar symptoms. She states he does have a history of seasonal allergies. She state she has been using OTC irritation eye drops with no relief. She also state she has been trying warm compress with no relief. She states that she does not wear contacts. Denies eyelid crusting, painful EOM, erythema or edema around eyes, ear pain, sinus congestion, rhinorrhea, sore throat, LAD, cough, visual disturbance, FB sensation in eyes, fever, chills, sob, or chest pain. Denies any trauma or foreign body in the eyes. Denies photophobia. Has had a corneal abrasion in the past and states this does not feel similar to that.   Patient is a 33 y.o. female presenting with conjunctivitis. The history is provided by the patient. No language interpreter was used.  Conjunctivitis This is a new problem. The current episode started in the past 7 days. The problem occurs constantly. The problem has been unchanged. Pertinent negatives include no abdominal pain, arthralgias, chest pain, chills, congestion, coughing, fever, headaches, myalgias, nausea, neck pain, numbness, rash, sore throat, swollen glands, visual change, vomiting or weakness. Nothing aggravates the symptoms.  Treatments tried: OTC eye drops. The treatment provided no relief.    Past Medical History  Diagnosis Date  . Asthma   . Thyroid disease    Past Surgical History  Procedure Laterality Date  . Extra digit removed     Family History  Problem Relation Age of Onset  . Hypothyroidism Mother    History  Substance Use Topics  . Smoking status: Current Every Day Smoker -- 0.50 packs/day for 10 years    Types: Cigarettes  . Smokeless tobacco: Never Used  . Alcohol Use: Yes     Comment: Social drinker    OB History   Grav Para Term Preterm Abortions TAB SAB Ect Mult Living   0 0 0 0 0 0 0 0 0 0      Review of Systems  Constitutional: Negative for fever and chills.  HENT: Negative for congestion, ear discharge, ear pain, postnasal drip, rhinorrhea, sinus pressure, sore throat and trouble swallowing.   Eyes: Positive for pain, discharge, redness and itching. Negative for photophobia and visual disturbance.  Respiratory: Negative for cough and shortness of breath.   Cardiovascular: Negative for chest pain.  Gastrointestinal: Negative for nausea, vomiting and abdominal pain.  Musculoskeletal: Negative for arthralgias, myalgias, neck pain and neck stiffness.  Skin: Negative for rash.  Allergic/Immunologic: Positive for environmental allergies.  Neurological: Negative for dizziness, weakness, numbness and headaches.   Allergies  Review of patient's allergies indicates no known allergies.  Home Medications   Prior to Admission medications   Medication Sig Start Date End Date Taking? Authorizing Provider  albuterol (  VENTOLIN HFA) 108 (90 BASE) MCG/ACT inhaler Inhale 2 puffs into the lungs every 6 (six) hours as needed for wheezing or shortness of breath.   Yes Historical Provider, MD  ibuprofen (ADVIL,MOTRIN) 200 MG tablet Take 400 mg by mouth every 6 (six) hours as needed for moderate pain.    Yes Historical Provider, MD   Triage Vitals: BP 141/76  Pulse 75  Temp(Src) 98.3 F (36.8  C) (Oral)  Resp 18  Ht 5\' 3"  (1.6 m)  Wt 250 lb (113.399 kg)  BMI 44.30 kg/m2  SpO2 98%  LMP 07/09/2013  Physical Exam  Nursing note and vitals reviewed. Constitutional: She is oriented to person, place, and time. Vital signs are normal. She appears well-developed and well-nourished. No distress.  Afebrile, non toxic  HENT:  Head: Normocephalic and atraumatic.  Right Ear: Hearing, tympanic membrane, external ear and ear canal normal.  Left Ear: Hearing, tympanic membrane, external ear and ear canal normal.  Nose: Rhinorrhea present.  Mouth/Throat: Uvula is midline, oropharynx is clear and moist and mucous membranes are normal.  Mild nasal turbinate erythema and swelling, cobblestoning. Allergic salute noted. Oropharynx clear, uvula midline, MMM.   Eyes: Conjunctivae, EOM and lids are normal. Pupils are equal, round, and reactive to light. Lids are everted and swept, no foreign bodies found. Right eye exhibits no chemosis and no discharge. Left eye exhibits no chemosis and no discharge. Right conjunctiva is not injected. Left conjunctiva is not injected.  Bilateral eyes free of chemosis or discharge. Bilateral conjunctiva free of injection or hemorrhage. Extraocular motions intact, without pain. PERRL, no photophobia. No foreign bodies noted with eversion of eyelids  Neck: Normal range of motion. Neck supple.  Cardiovascular: Normal rate and intact distal pulses.   Pulmonary/Chest: Effort normal. No respiratory distress.  Abdominal: Normal appearance. She exhibits no distension.  Musculoskeletal: Normal range of motion.  Lymphadenopathy:    She has no cervical adenopathy.  no anterior cervical lymphadenopathy  Neurological: She is alert and oriented to person, place, and time.  Skin: Skin is warm, dry and intact. No rash noted.  Psychiatric: She has a normal mood and affect. Her behavior is normal.    ED Course  Procedures (including critical care time) DIAGNOSTIC STUDIES: Oxygen  Saturation is 98% on RA, normal by my interpretation.    COORDINATION OF CARE: 4:55 PM-Discussed treatment plan which includes eye drops and eye ointment with pt at bedside and pt agreed to plan.     Labs Review Labs Reviewed - No data to display  Imaging Review No results found.   EKG Interpretation None      MDM   Final diagnoses:  None   Nadirah L Rocchio is a 33 y.o. female past medical history of environmental seasonal allergies, presenting to the emergency room today with bilateral eye itching. Exam reveals bilateral eye clear drainage but no conjunctival irritation. Patient is not photophobic, has no foreign body sensation, is not having any blepharospasm or blurred vision, therefore I do not feel that a fluorescein stain is necessary at this time. Doubt corneal abrasion. At this time, I feel this is likely due to her allergies, but given the risk of missing bacterial conjunctivitis, we'll treat for both. Will give Rx for erythromycin ointment, as well as naphazoline pheniramine drops. Discussed followup with ophthalmologist to ensure that her eyes are improving. I explained the diagnosis and have given explicit precautions to return to the ER including for any other new or worsening symptoms. The patient understands  and accepts the medical plan as it's been dictated and I have answered their questions. Discharge instructions concerning home care and prescriptions have been given. The patient is STABLE and is discharged to home in good condition.  I personally performed the services described in this documentation, which was scribed in my presence. The recorded information has been reviewed and is accurate.  BP 141/76  Pulse 75  Temp(Src) 98.3 F (36.8 C) (Oral)  Resp 18  Ht 5\' 3"  (1.6 m)  Wt 250 lb (113.399 kg)  BMI 44.30 kg/m2  SpO2 98%  LMP 07/09/2013   [place]nita [place] Camprubi-Soms, PA-C 07/30/13 [phone]

## 2013-07-29 NOTE — ED Notes (Signed)
C/o redness and drainage from both eyes x 3 days.  States it started in R eye first but is now in L eye also.  States she has used OTC eye drops that increased the burning.

## 2013-07-29 NOTE — Discharge Instructions (Signed)
Conjunctivitis is very contagious if it is bacterial or viral, which cannot be ruled out based on symptoms alone. Try to avoid rubbing eyes. Apply cold compresses and use prescribed eye drops as directed. Use prescribed eye ointment as directed. Follow up with the ophthalmologist above in 1 week if symptoms persist. Return to the emergency department for any changes or worsening symptoms. Take your home allergy medications as directed.    Allergies  Allergies may happen from anything your body is sensitive to. This may be food, medicines, pollens, chemicals, and many other things. Food allergies can be severe and deadly.  HOME CARE  If you do not know what causes a reaction, keep a diary. Write down the foods you ate and the symptoms that followed. Avoid foods that cause reactions.  If you have red raised spots (hives) or a rash:  Take medicine as told by your doctor.  Use medicines for red raised spots and itching as needed.  Apply cold cloths (compresses) to the skin. Take a cool bath. Avoid hot baths or showers.  If you are severely allergic:  It is often necessary to go to the hospital after you have treated your reaction.  Wear your medical alert jewelry.  You and your family must learn how to give a allergy shot or use an allergy kit (anaphylaxis kit).  Always carry your allergy kit or shot with you. Use this medicine as told by your doctor if a severe reaction is occurring. GET HELP RIGHT AWAY IF:  You have trouble breathing or are making high-pitched whistling sounds (wheezing).  You have a tight feeling in your chest or throat.  You have a puffy (swollen) mouth.  You have red raised spots, puffiness (swelling), or itching all over your body.  You have had a severe reaction that was helped by your allergy kit or shot. The reaction can return once the medicine has worn off.  You think you are having a food allergy. Symptoms most often happen within 30 minutes of eating a  food.  Your symptoms have not gone away within 2 days or are getting worse.  You have new symptoms.  You want to retest yourself with a food or drink you think causes an allergic reaction. Only do this under the care of a doctor. MAKE SURE YOU:   Understand these instructions.  Will watch your condition.  Will get help right away if you are not doing well or get worse. Document Released: 04/28/2012 Document Reviewed: 04/28/2012 [facility] Patient Information 2015 [place]. This information is not intended to replace advice given to you by your health care provider. Make sure you discuss any questions you have with your health care provider.  Conjunctivitis Conjunctivitis is commonly called "pink eye." Conjunctivitis can be caused by bacterial or viral infection, allergies, or injuries. There is usually redness of the lining of the eye, itching, discomfort, and sometimes discharge. There may be deposits of matter along the eyelids. A viral infection usually causes a watery discharge, while a bacterial infection causes a yellowish, thick discharge. Pink eye is very contagious and spreads by direct contact. You may be given antibiotic eyedrops as part of your treatment. Before using your eye medicine, remove all drainage from the eye by washing gently with warm water and cotton balls. Continue to use the medication until you have awakened 2 mornings in a row without discharge from the eye. Do not rub your eye. This increases the irritation and helps spread infection. Use separate  towels from other household members. Wash your hands with soap and water before and after touching your eyes. Use cold compresses to reduce pain and sunglasses to relieve irritation from light. Do not wear contact lenses or wear eye makeup until the infection is gone. SEEK MEDICAL CARE IF:   Your symptoms are not better after 3 days of treatment.  You have increased pain or trouble seeing.  The outer eyelids  become very red or swollen. Document Released: 02/09/2004 Document Revised: 03/26/2011 Document Reviewed: 01/01/2005 [facility] Patient Information 2015 [place], [place]. This information is not intended to replace advice given to you by your health care provider. Make sure you discuss any questions you have with your health care provider.

## 2013-07-30 NOTE — ED Provider Notes (Signed)
Medical screening examination/treatment/procedure(s) were performed by non-physician practitioner and as supervising physician I was immediately available for consultation/collaboration.   EKG Interpretation None        Laray AngerKathleen M Rebecka Oelkers, DO 07/30/13 1446

## 2013-08-20 ENCOUNTER — Encounter (HOSPITAL_COMMUNITY): Payer: Self-pay | Admitting: Emergency Medicine

## 2013-08-20 ENCOUNTER — Emergency Department (HOSPITAL_COMMUNITY)
Admission: EM | Admit: 2013-08-20 | Discharge: 2013-08-20 | Disposition: A | Discharge status: Home / Self Care | Payer: No Typology Code available for payment source | Attending: Emergency Medicine | Admitting: Emergency Medicine

## 2013-08-20 DIAGNOSIS — K089 Disorder of teeth and supporting structures, unspecified: Secondary | ICD-10-CM | POA: Insufficient documentation

## 2013-08-20 DIAGNOSIS — J45909 Unspecified asthma, uncomplicated: Secondary | ICD-10-CM | POA: Insufficient documentation

## 2013-08-20 DIAGNOSIS — K0889 Other specified disorders of teeth and supporting structures: Secondary | ICD-10-CM

## 2013-08-20 DIAGNOSIS — F17200 Nicotine dependence, unspecified, uncomplicated: Secondary | ICD-10-CM | POA: Insufficient documentation

## 2013-08-20 DIAGNOSIS — Z8639 Personal history of other endocrine, nutritional and metabolic disease: Secondary | ICD-10-CM | POA: Insufficient documentation

## 2013-08-20 DIAGNOSIS — Z862 Personal history of diseases of the blood and blood-forming organs and certain disorders involving the immune mechanism: Secondary | ICD-10-CM | POA: Insufficient documentation

## 2013-08-20 MED ORDER — PENICILLIN V POTASSIUM 500 MG PO TABS: 500.0000 mg | ORAL_TABLET | Freq: Four times a day (QID) | ORAL | Status: DC

## 2013-08-20 MED ORDER — ONDANSETRON 8 MG PO TBDP
8.0000 mg | ORAL_TABLET | Freq: Once | ORAL | Status: AC
  Administered 2013-08-20: 8 mg via ORAL
  Filled 2013-08-20: qty 1

## 2013-08-20 MED ORDER — OXYCODONE-ACETAMINOPHEN 5-325 MG PO TABS: 1.0000 | ORAL_TABLET | Freq: Once | ORAL | Status: DC

## 2013-08-20 MED ORDER — PROMETHAZINE HCL 25 MG PO TABS: 25.0000 mg | ORAL_TABLET | Freq: Four times a day (QID) | ORAL | Status: DC | PRN

## 2013-08-20 MED ORDER — OXYCODONE-ACETAMINOPHEN 5-325 MG PO TABS
2.0000 | ORAL_TABLET | Freq: Once | ORAL | Status: AC
  Administered 2013-08-20: 2 via ORAL
  Filled 2013-08-20: qty 2

## 2013-08-20 MED ORDER — OXYCODONE-ACETAMINOPHEN 5-325 MG PO TABS: 1.0000 | ORAL_TABLET | Freq: Four times a day (QID) | ORAL | Status: DC | PRN

## 2013-08-20 NOTE — ED Provider Notes (Signed)
CSN: [id number]     Arrival date & time 08/20/13  1445 History   First MD Initiated Contact with Patient 08/20/13 1522    This chart was scribed for non-physician practitioner, Junious Silk, working with Toy Cookey, MD by Marica Otter, ED Scribe. This patient was seen in room WTR5/WTR5 and the patient's care was started at 4:38 PM.  Chief Complaint  Patient presents with  . Dental Pain   The history is provided by the patient. No language interpreter was used.   HPI Comments: [name] is a 33 y.o. female, with medical Hx noted below, who presents to the Emergency Department complaining of swollen gums with associated lower, left sided dental pain onset 3 days ago. She describes the pain as aching. Pt also complains of associated low grade fever last week (99 degrees).   Pt reports taking ibuprofen last night without relief. Pt reports that she experienced similar pain in the past, however, it resolved on its own.   Past Medical History  Diagnosis Date  . Asthma   . Thyroid disease    Past Surgical History  Procedure Laterality Date  . Extra digit removed     Family History  Problem Relation Age of Onset  . Hypothyroidism Mother    History  Substance Use Topics  . Smoking status: Current Every Day Smoker -- 0.50 packs/day for 10 years    Types: Cigarettes  . Smokeless tobacco: Never Used  . Alcohol Use: Yes     Comment: Social drinker    OB History   Grav Para Term Preterm Abortions TAB SAB Ect Mult Living   0 0 0 0 0 0 0 0 0 0      Review of Systems  HENT: Positive for dental problem (swollen gums and left, lower dental pain).   Psychiatric/Behavioral: Negative for confusion.  All other systems reviewed and are negative.  Allergies  Review of patient's allergies indicates no known allergies.  Home Medications   Prior to Admission medications   Medication Sig Start Date End Date Taking? Authorizing Provider  albuterol (VENTOLIN HFA) 108 (90 BASE)  MCG/ACT inhaler Inhale 2 puffs into the lungs every 6 (six) hours as needed for wheezing or shortness of breath.   Yes Historical Provider, MD  ibuprofen (ADVIL,MOTRIN) 200 MG tablet Take 400 mg by mouth every 6 (six) hours as needed for moderate pain.    Yes Historical Provider, MD  traMADol (ULTRAM) 50 MG tablet Take 50 mg by mouth every 6 (six) hours as needed for moderate pain.   Yes Historical Provider, MD   Triage Vitals: BP 129/77  Pulse 86  Temp(Src) 99 F (37.2 C) (Oral)  Resp 18  SpO2 98%  LMP 08/11/2013 Physical Exam  Nursing note and vitals reviewed. Constitutional: She is oriented to person, place, and time. She appears well-developed and well-nourished. No distress.  HENT:  Head: Normocephalic and atraumatic.  Right Ear: External ear normal.  Left Ear: External ear normal.  Nose: Nose normal.  Mouth/Throat: Oropharynx is clear and moist.    No trismus, submental edema, or tongue elevation.   Eyes: Conjunctivae are normal.  Neck: Normal range of motion.  Cardiovascular: Normal rate, regular rhythm and normal heart sounds.   Pulmonary/Chest: Effort normal and breath sounds normal. No stridor. No respiratory distress. She has no wheezes. She has no rales.  Abdominal: Soft. She exhibits no distension.  Musculoskeletal: Normal range of motion.  Neurological: She is alert and oriented to person, place, and time.  She has normal strength.  Skin: Skin is warm and dry. She is not diaphoretic. No erythema.  Psychiatric: She has a normal mood and affect. Her behavior is normal.    ED Course  Procedures (including critical care time) DIAGNOSTIC STUDIES: Oxygen Saturation is 98% on RA, nl by my interpretation.    COORDINATION OF CARE: 4:40 PM-Discussed treatment plan which includes dental referral and meds with pt at bedside and pt agreed to plan.   Labs Review Labs Reviewed - No data to display  Imaging Review No results found.   EKG Interpretation None      MDM    Final diagnoses:  Pain, dental   Patient with toothache.  No gross abscess.  Exam unconcerning for Ludwig's angina or spread of infection.  Will treat with penicillin and pain medicine.  Urged patient to follow-up with dentist.     I personally performed the services described in this documentation, which was scribed in my presence. The recorded information has been reviewed and is accurate.    Mora BellmanHannah S Samson Ralph, PA-C 08/20/13 2024

## 2013-08-20 NOTE — Discharge Instructions (Signed)

## 2013-08-20 NOTE — ED Notes (Signed)
Per pt, dental pain since Monday.  Has hx of same.  Gums swollen and painful

## 2013-08-21 NOTE — ED Provider Notes (Signed)
Medical screening examination/treatment/procedure(s) were performed by non-physician practitioner and as supervising physician I was immediately available for consultation/collaboration.  Toy CookeyMegan Docherty, MD 08/21/13 [phone]

## 2013-12-27 ENCOUNTER — Emergency Department (HOSPITAL_COMMUNITY)
Admission: EM | Admit: 2013-12-27 | Discharge: 2013-12-27 | Disposition: A | Discharge status: Home / Self Care | Payer: No Typology Code available for payment source | Attending: Emergency Medicine | Admitting: Emergency Medicine

## 2013-12-27 ENCOUNTER — Encounter (HOSPITAL_COMMUNITY): Payer: Self-pay | Admitting: Emergency Medicine

## 2013-12-27 DIAGNOSIS — J45909 Unspecified asthma, uncomplicated: Secondary | ICD-10-CM | POA: Insufficient documentation

## 2013-12-27 DIAGNOSIS — Z792 Long term (current) use of antibiotics: Secondary | ICD-10-CM | POA: Insufficient documentation

## 2013-12-27 DIAGNOSIS — K029 Dental caries, unspecified: Secondary | ICD-10-CM | POA: Insufficient documentation

## 2013-12-27 DIAGNOSIS — R599 Enlarged lymph nodes, unspecified: Secondary | ICD-10-CM | POA: Insufficient documentation

## 2013-12-27 DIAGNOSIS — Z8639 Personal history of other endocrine, nutritional and metabolic disease: Secondary | ICD-10-CM | POA: Insufficient documentation

## 2013-12-27 DIAGNOSIS — Z79899 Other long term (current) drug therapy: Secondary | ICD-10-CM | POA: Insufficient documentation

## 2013-12-27 DIAGNOSIS — Z720 Tobacco use: Secondary | ICD-10-CM | POA: Insufficient documentation

## 2013-12-27 MED ORDER — IBUPROFEN 800 MG PO TABS
800.0000 mg | ORAL_TABLET | Freq: Once | ORAL | Status: AC
  Administered 2013-12-27: 800 mg via ORAL
  Filled 2013-12-27: qty 1

## 2013-12-27 MED ORDER — AMOXICILLIN 500 MG PO CAPS: 500.0000 mg | ORAL_CAPSULE | Freq: Three times a day (TID) | ORAL | Status: DC

## 2013-12-27 MED ORDER — HYDROCODONE-ACETAMINOPHEN 5-325 MG PO TABS
2.0000 | ORAL_TABLET | Freq: Once | ORAL | Status: AC
  Administered 2013-12-27: 2 via ORAL
  Filled 2013-12-27: qty 2

## 2013-12-27 MED ORDER — HYDROCODONE-ACETAMINOPHEN 7.5-325 MG PO TABS: 1.0000 | ORAL_TABLET | ORAL | Status: DC | PRN

## 2013-12-27 MED ORDER — ONDANSETRON HCL 4 MG PO TABS
4.0000 mg | ORAL_TABLET | Freq: Once | ORAL | Status: AC
  Administered 2013-12-27: 4 mg via ORAL
  Filled 2013-12-27: qty 1

## 2013-12-27 MED ORDER — IBUPROFEN 800 MG PO TABS: 800.0000 mg | ORAL_TABLET | Freq: Three times a day (TID) | ORAL | Status: DC

## 2013-12-27 MED ORDER — AMOXICILLIN 250 MG PO CAPS
500.0000 mg | ORAL_CAPSULE | Freq: Once | ORAL | Status: AC
  Administered 2013-12-27: 500 mg via ORAL
  Filled 2013-12-27: qty 2

## 2013-12-27 NOTE — ED Provider Notes (Signed)
CSN: [id number]     Arrival date & time 12/27/13  1522 History  This chart was scribed for non-physician practitioner, Ivery QualeHobson Riggs Dineen, PA-C,working with Toy BakerAnthony T Allen, MD, by Karle PlumberJennifer Tensley, ED Scribe. This patient was seen in room APFT23/APFT23 and the patient's care was started at 4:11 PM.  Chief Complaint  Patient presents with  . Dental Pain   Patient is a 33 y.o. female presenting with tooth pain. The history is provided by the patient. No language interpreter was used.  Dental Pain Associated symptoms: no facial swelling and no fever     HPI Comments:  [name] is a 33 y.o. obese female who presents to the Emergency Department complaining of lower right dental pain that began yesterday. She reports there is a cavity in the tooth. Pt states she has been taking Tylenol and Ibuprofen with no significant relief of the pain. Eating makes the pain worse with no alleviating factors reported. Denies rash, fever, chills, inability to swallow, facial swelling, nausea or vomiting. PMH of asthma and thyroid disease.  Past Medical History  Diagnosis Date  . Asthma   . Thyroid disease    Past Surgical History  Procedure Laterality Date  . Extra digit removed     Family History  Problem Relation Age of Onset  . Hypothyroidism Mother    History  Substance Use Topics  . Smoking status: Current Every Day Smoker -- 0.50 packs/day for 10 years    Types: Cigarettes  . Smokeless tobacco: Never Used  . Alcohol Use: Yes     Comment: Social drinker    OB History    Gravida Para Term Preterm AB TAB SAB Ectopic Multiple Living   0 0 0 0 0 0 0 0 0 0      Review of Systems  Constitutional: Negative for fever and chills.  HENT: Positive for dental problem. Negative for facial swelling and trouble swallowing.   Gastrointestinal: Negative for nausea and vomiting.  All other systems reviewed and are negative.   Allergies  Review of patient's allergies indicates no known  allergies.  Home Medications   Prior to Admission medications   Medication Sig Start Date End Date Taking? Authorizing Provider  albuterol (VENTOLIN HFA) 108 (90 BASE) MCG/ACT inhaler Inhale 2 puffs into the lungs every 6 (six) hours as needed for wheezing or shortness of breath.    Historical Provider, MD  ibuprofen (ADVIL,MOTRIN) 200 MG tablet Take 400 mg by mouth every 6 (six) hours as needed for moderate pain.     Historical Provider, MD  oxyCODONE-acetaminophen (PERCOCET/ROXICET) 5-325 MG per tablet Take 1-2 tablets by mouth every 6 (six) hours as needed for moderate pain or severe pain. 08/20/13   Mora BellmanHannah S Merrell, PA-C  penicillin v potassium (VEETID) 500 MG tablet Take 1 tablet (500 mg total) by mouth 4 (four) times daily. 08/20/13   Mora BellmanHannah S Merrell, PA-C  promethazine (PHENERGAN) 25 MG tablet Take 1 tablet (25 mg total) by mouth every 6 (six) hours as needed for nausea or vomiting. 08/20/13   Mora BellmanHannah S Merrell, PA-C  traMADol (ULTRAM) 50 MG tablet Take 50 mg by mouth every 6 (six) hours as needed for moderate pain.    Historical Provider, MD   Triage Vitals: BP 164/97 mmHg  Pulse 89  Temp(Src) 99 F (37.2 C) (Oral)  Resp 17  Ht 5\' 3"  (1.6 m)  Wt 268 lb (121.564 kg)  BMI 47.49 kg/m2  SpO2 100%  LMP 11/15/2013 Physical Exam  Constitutional: She  is oriented to person, place, and time. She appears well-developed and well-nourished.  HENT:  Head: Normocephalic and atraumatic.  Mouth/Throat: Uvula is midline, oropharynx is clear and moist and mucous membranes are normal. No trismus in the jaw. Dental caries present. No dental abscesses.  No facial asymmetry and no temperature changes to skin. Multiple dental carries, right later than left. No visible abscess. Airway patent. No swelling under tongue.  Eyes: EOM are normal.  Neck: Normal range of motion.  Cardiovascular: Normal rate, regular rhythm and normal heart sounds.  Exam reveals no gallop and no friction rub.   No murmur  heard. Pulmonary/Chest: Effort normal and breath sounds normal. No respiratory distress. She has no wheezes. She has no rales.  Musculoskeletal: Normal range of motion.  Lymphadenopathy:    She has no cervical adenopathy.  Few tender submental nodes on the right.  Neurological: She is alert and oriented to person, place, and time.  Skin: Skin is warm and dry.  Psychiatric: She has a normal mood and affect. Her behavior is normal.  Nursing note and vitals reviewed.   ED Course  Procedures (including critical care time) DIAGNOSTIC STUDIES: Oxygen Saturation is 100% on RA, RA by my interpretation.   COORDINATION OF CARE: 4:18 PM- Will prescribe Amoxicillin and pain medication. Pt verbalizes understanding and agrees to plan.  Medications - No data to display  Labs Review Labs Reviewed - No data to display  Imaging Review No results found.   EKG Interpretation None      MDM  No high fever or tachycardia.  No airway compromise. No evidence for Ludwig's angina. Plan -  Rx for amoxil, norco, and ibuprofen. Dental resources given to the patient.   Final diagnoses:  None    **I have reviewed nursing notes, vital signs, and all appropriate lab and imaging results for this patient.*  I personally performed the services described in this documentation, which was scribed in my presence. The recorded information has been reviewed and is accurate.    Kathie DikeHobson M Aser Nylund, PA-C 12/27/13 1633  Toy BakerAnthony T Allen, MD 12/27/13 [phone]

## 2013-12-27 NOTE — Discharge Instructions (Signed)
Dental Caries  Dental caries (also called tooth decay) is the most common oral disease. It can occur at any age but is more common in children and young adults.   HOW DENTAL CARIES DEVELOPS   The process of decay begins when bacteria and foods (particularly sugars and starches) combine in your mouth to produce plaque. Plaque is a substance that sticks to the hard, outer surface of a tooth (enamel). The bacteria in plaque produce acids that attack enamel. These acids may also attack the root surface of a tooth (cementum) if it is exposed. Repeated attacks dissolve these surfaces and create holes in the tooth (cavities). If left untreated, the acids destroy the other layers of the tooth.   RISK FACTORS   Frequent sipping of sugary beverages.    Frequent snacking on sugary and starchy foods, especially those that easily get stuck in the teeth.    Poor oral hygiene.    Dry mouth.    Substance abuse such as methamphetamine abuse.    Broken or poor-fitting dental restorations.    Eating disorders.    Gastroesophageal reflux disease (GERD).    Certain radiation treatments to the head and neck.  SYMPTOMS  In the early stages of dental caries, symptoms are seldom present. Sometimes white, chalky areas may be seen on the enamel or other tooth layers. In later stages, symptoms may include:   Pits and holes on the enamel.   Toothache after sweet, hot, or cold foods or drinks are consumed.   Pain around the tooth.   Swelling around the tooth.  DIAGNOSIS   Most of the time, dental caries is detected during a regular dental checkup. A diagnosis is made after a thorough medical and dental history is taken and the surfaces of your teeth are checked for signs of dental caries. Sometimes special instruments, such as lasers, are used to check for dental caries. Dental X-ray exams may be taken so that areas not visible to the eye (such as between the contact areas of the teeth) can be checked for cavities.    TREATMENT   If dental caries is in its early stages, it may be reversed with a fluoride treatment or an application of a remineralizing agent at the dental office. Thorough brushing and flossing at home is needed to aid these treatments. If it is in its later stages, treatment depends on the location and extent of tooth destruction:    If a small area of the tooth has been destroyed, the destroyed area will be removed and cavities will be filled with a material such as gold, silver amalgam, or composite resin.    If a large area of the tooth has been destroyed, the destroyed area will be removed and a cap (crown) will be fitted over the remaining tooth structure.    If the center part of the tooth (pulp) is affected, a procedure called a root canal will be needed before a filling or crown can be placed.    If most of the tooth has been destroyed, the tooth may need to be pulled (extracted).  HOME CARE INSTRUCTIONS  You can prevent, stop, or reverse dental caries at home by practicing good oral hygiene. Good oral hygiene includes:   Thoroughly cleaning your teeth at least twice a day with a toothbrush and dental floss.    Using a fluoride toothpaste. A fluoride mouth rinse may also be used if recommended by your dentist or health care provider.    Restricting   the amount of sugary and starchy foods and sugary liquids you consume.    Avoiding frequent snacking on these foods and sipping of these liquids.    Keeping regular visits with a dentist for checkups and cleanings.  PREVENTION    Practice good oral hygiene.   Consider a dental sealant. A dental sealant is a coating material that is applied by your dentist to the pits and grooves of teeth. The sealant prevents food from being trapped in them. It may protect the teeth for several years.   Ask about fluoride supplements if you live in a community without fluorinated water or with water that has a low fluoride content. Use fluoride supplements  as directed by your dentist or health care provider.   Allow fluoride varnish applications to teeth if directed by your dentist or health care provider.  Document Released: 09/23/2001 Document Revised: 05/18/2013 Document Reviewed: 01/04/2012  [employer] Patient Information 2015 [employer]. This information is not intended to replace advice given to you by your health care provider. Make sure you discuss any questions you have with your health care provider.

## 2013-12-27 NOTE — ED Notes (Signed)
Patient c/o right lower dental pain that started yesterday. Patient reports taking tylenol and ibuprofen with no relief.

## 2014-01-11 ENCOUNTER — Encounter: Payer: Self-pay | Admitting: *Deleted

## 2014-01-12 ENCOUNTER — Encounter: Payer: Self-pay | Admitting: Obstetrics & Gynecology

## 2014-01-19 ENCOUNTER — Encounter (HOSPITAL_COMMUNITY): Payer: Self-pay | Admitting: Emergency Medicine

## 2014-01-19 ENCOUNTER — Emergency Department (HOSPITAL_COMMUNITY)
Admission: EM | Admit: 2014-01-19 | Discharge: 2014-01-19 | Disposition: A | Discharge status: Home / Self Care | Payer: No Typology Code available for payment source | Attending: Emergency Medicine | Admitting: Emergency Medicine

## 2014-01-19 DIAGNOSIS — Z720 Tobacco use: Secondary | ICD-10-CM | POA: Insufficient documentation

## 2014-01-19 DIAGNOSIS — Z792 Long term (current) use of antibiotics: Secondary | ICD-10-CM | POA: Diagnosis not present

## 2014-01-19 DIAGNOSIS — Z791 Long term (current) use of non-steroidal anti-inflammatories (NSAID): Secondary | ICD-10-CM | POA: Diagnosis not present

## 2014-01-19 DIAGNOSIS — I10 Essential (primary) hypertension: Secondary | ICD-10-CM | POA: Diagnosis not present

## 2014-01-19 DIAGNOSIS — R600 Localized edema: Secondary | ICD-10-CM | POA: Insufficient documentation

## 2014-01-19 DIAGNOSIS — J45909 Unspecified asthma, uncomplicated: Secondary | ICD-10-CM | POA: Diagnosis not present

## 2014-01-19 DIAGNOSIS — E079 Disorder of thyroid, unspecified: Secondary | ICD-10-CM | POA: Insufficient documentation

## 2014-01-19 DIAGNOSIS — R609 Edema, unspecified: Secondary | ICD-10-CM

## 2014-01-19 DIAGNOSIS — M7989 Other specified soft tissue disorders: Secondary | ICD-10-CM | POA: Diagnosis present

## 2014-01-19 DIAGNOSIS — Z79899 Other long term (current) drug therapy: Secondary | ICD-10-CM | POA: Insufficient documentation

## 2014-01-19 HISTORY — DX: Essential (primary) hypertension: I10

## 2014-01-19 MED ORDER — NAPROXEN 500 MG PO TABS: 500.0000 mg | ORAL_TABLET | Freq: Two times a day (BID) | ORAL | Status: DC

## 2014-01-19 MED ORDER — NAPROXEN 500 MG PO TABS
500.0000 mg | ORAL_TABLET | Freq: Once | ORAL | Status: AC
  Administered 2014-01-19: 500 mg via ORAL
  Filled 2014-01-19: qty 1

## 2014-01-19 NOTE — ED Provider Notes (Signed)
CSN: [id number]     Arrival date & time 01/19/14  1191 History   First MD Initiated Contact with Patient 01/19/14 [phone]     Chief Complaint  Patient presents with  . Foot Pain   (Consider location/radiation/quality/duration/timing/severity/associated sxs/prior Treatment) HPI [name] is a 34 yo presenting with recurrent bilat feet pain.  She reports this is an ongoing problem for years but it recently worsened last week.  She reports the pain is worse after standing on her feet at work all day, she notes mild bilat swelling in both feet that is worse at the end of the day and improves at night.  She denies any chest pain, shortness of breath, fevers, chills, or leg pain or swelling.  Past Medical History  Diagnosis Date  . Asthma   . Thyroid disease   . Hypertension    Past Surgical History  Procedure Laterality Date  . Extra digit removed     Family History  Problem Relation Age of Onset  . Hypothyroidism Mother   . Hypertension Other    History  Substance Use Topics  . Smoking status: Current Every Day Smoker -- 0.50 packs/day for 10 years    Types: Cigarettes  . Smokeless tobacco: Never Used  . Alcohol Use: Yes     Comment: Social drinker    OB History    Gravida Para Term Preterm AB TAB SAB Ectopic Multiple Living       Review of Systems  Constitutional: Negative for fever and chills.  Eyes: Negative for visual disturbance.  Respiratory: Negative for shortness of breath.   Cardiovascular: Negative for chest pain and leg swelling.  Gastrointestinal: Negative for nausea and vomiting.  Musculoskeletal: Positive for myalgias.  Skin: Negative for rash.  Neurological: Negative for weakness, numbness and headaches.      Allergies  Review of patient's allergies indicates no known allergies.  Home Medications   Prior to Admission medications   Medication Sig Start Date End Date Taking? Authorizing Provider  acetaminophen (TYLENOL) 500  MG tablet Take 500 mg by mouth every 6 (six) hours as needed for mild pain, moderate pain or headache.   Yes Historical Provider, MD  albuterol (VENTOLIN HFA) 108 (90 BASE) MCG/ACT inhaler Inhale 2 puffs into the lungs every 6 (six) hours as needed for wheezing or shortness of breath.   Yes Historical Provider, MD  hydrochlorothiazide (HYDRODIURIL) 25 MG tablet Take 25 mg by mouth daily.   Yes Historical Provider, MD  HYDROcodone-acetaminophen (NORCO) 7.5-325 MG per tablet Take 1 tablet by mouth every 4 (four) hours as needed. 12/27/13  Yes Kathie Dike, PA-C  ibuprofen (ADVIL,MOTRIN) 800 MG tablet Take 1 tablet (800 mg total) by mouth 3 (three) times daily. 12/27/13  Yes Kathie Dike, PA-C  levothyroxine (SYNTHROID, LEVOTHROID) 25 MCG tablet Take 25 mcg by mouth daily before breakfast.   Yes Historical Provider, MD  amoxicillin (AMOXIL) 500 MG capsule Take 1 capsule (500 mg total) by mouth 3 (three) times daily. Patient not taking: Reported on 01/19/2014 12/27/13   Kathie Dike, PA-C  oxyCODONE-acetaminophen (PERCOCET/ROXICET) 5-325 MG per tablet Take 1-2 tablets by mouth every 6 (six) hours as needed for moderate pain or severe pain. Patient not taking: Reported on 12/27/2013 08/20/13   Mora Bellman, PA-C  promethazine (PHENERGAN) 25 MG tablet Take 1 tablet (25 mg total) by mouth every 6 (six) hours as needed for nausea or vomiting. Patient  not taking: Reported on 12/27/2013 08/20/13   Mora BellmanHannah S Merrell, PA-C   BP 155/84 mmHg  Pulse 88  Temp(Src) 98.6 F (37 C) (Oral)  Resp 16  Ht 5\' 3"  (1.6 m)  Wt 264 lb (119.75 kg)  BMI 46.78 kg/m2  LMP 01/08/2014 (Exact Date) Physical Exam  Constitutional: She appears well-developed and well-nourished. No distress.  HENT:  Head: Normocephalic and atraumatic.  Eyes: Conjunctivae are normal.  Neck: Neck supple.  Cardiovascular: Normal rate, regular rhythm and intact distal pulses.   Pulmonary/Chest: Effort normal and breath sounds normal. No  respiratory distress.  Abdominal: Soft. There is no tenderness.  Lymphadenopathy:    She has no cervical adenopathy.  Neurological: She is alert.  Skin: Skin is warm and dry. No rash noted. She is not diaphoretic.  Intermittent burning and swelling in feet, no redness, edema or tenderness currently.  Psychiatric: She has a normal mood and affect.  Nursing note and vitals reviewed.   ED Course  Procedures (including critical care time) Labs Review Labs Reviewed - No data to display  Imaging Review No results found.   EKG Interpretation None      MDM   Final diagnoses:  Peripheral edema   34 yo with ongoing bilat foot pain worse after 12 hour shift at work.  Pt reports bilat swelling worse at the end of the day and improved after a night of rest.  Doubt infection or thrombosis. Discussed with pt use of compression stockings while at work and quality foot wear.  Also discussed with pt may be necessary to increase dose of HCTZ but pt has scheduled appt with PCP tomorrow and will discuss this option.  Pt is well-appearing, in no acute distress and vital signs are stable.  They appear safe to be discharged.  Return precautions provided.    Filed Vitals:   01/19/14 0700  BP: 155/84  Pulse: 88  Temp: 98.6 F (37 C)  TempSrc: Oral  Resp: 16  Height: 5\' 3"  (1.6 m)  Weight: 264 lb (119.75 kg)   Meds given in ED:  Medications  naproxen (NAPROSYN) tablet 500 mg (500 mg Oral Given 01/19/14 0831)    Discharge Medication List as of 01/19/2014  8:11 AM         Harle BattiestElizabeth Jacori Mulrooney, NP 01/22/14 0144  Juliet RudeNathan R. Rubin PayorPickering, MD 01/22/14 2358

## 2014-01-19 NOTE — ED Notes (Signed)
Pt states she is having burning and pain to both her feet  Pt states she has been having this for "a while" but it got worse last night

## 2014-01-19 NOTE — Discharge Instructions (Signed)
Please follow the directions provided.  Be sure to keep your appointment with your primary care provider tomorrow to discuss concerns of feet pain and mild swelling .  This is usually treated by increasing your HCTZ medicines but because you are scheduled for a visit tomorrow, she can decide how much to adjust your medicines.  It may be helpful to wear compression stockings while at work to minimize discomfort and wear insoles in your shoes.  You may take the naproxen twice a day for some pain relief also.  Don't hesitate to return for any new, worsening or concerning symptoms.   SEEK IMMEDIATE MEDICAL CARE IF:  You have increased swelling, pain, redness, or heat in your legs.  You develop shortness of breath, especially when lying down.  You develop chest or abdominal pain, weakness, or fainting.  You have a fever

## 2014-01-21 ENCOUNTER — Emergency Department (HOSPITAL_COMMUNITY)
Admission: EM | Admit: 2014-01-21 | Discharge: 2014-01-21 | Disposition: A | Discharge status: Home / Self Care | Payer: No Typology Code available for payment source | Attending: Emergency Medicine | Admitting: Emergency Medicine

## 2014-01-21 ENCOUNTER — Emergency Department (HOSPITAL_COMMUNITY): Payer: No Typology Code available for payment source

## 2014-01-21 ENCOUNTER — Encounter (HOSPITAL_COMMUNITY): Payer: Self-pay | Admitting: Emergency Medicine

## 2014-01-21 DIAGNOSIS — R079 Chest pain, unspecified: Secondary | ICD-10-CM | POA: Diagnosis present

## 2014-01-21 DIAGNOSIS — R0789 Other chest pain: Secondary | ICD-10-CM | POA: Insufficient documentation

## 2014-01-21 DIAGNOSIS — Z79899 Other long term (current) drug therapy: Secondary | ICD-10-CM | POA: Insufficient documentation

## 2014-01-21 DIAGNOSIS — E039 Hypothyroidism, unspecified: Secondary | ICD-10-CM | POA: Diagnosis not present

## 2014-01-21 DIAGNOSIS — I10 Essential (primary) hypertension: Secondary | ICD-10-CM | POA: Diagnosis not present

## 2014-01-21 DIAGNOSIS — E119 Type 2 diabetes mellitus without complications: Secondary | ICD-10-CM | POA: Diagnosis not present

## 2014-01-21 DIAGNOSIS — E669 Obesity, unspecified: Secondary | ICD-10-CM | POA: Diagnosis not present

## 2014-01-21 DIAGNOSIS — Z720 Tobacco use: Secondary | ICD-10-CM | POA: Diagnosis not present

## 2014-01-21 DIAGNOSIS — Z792 Long term (current) use of antibiotics: Secondary | ICD-10-CM | POA: Diagnosis not present

## 2014-01-21 DIAGNOSIS — J45909 Unspecified asthma, uncomplicated: Secondary | ICD-10-CM | POA: Insufficient documentation

## 2014-01-21 HISTORY — DX: Type 2 diabetes mellitus without complications: E11.9

## 2014-01-21 LAB — CBC
HCT: 41.6 % (ref 36.0–46.0)
Hemoglobin: 12.5 g/dL (ref 12.0–15.0)
MCH: 22.4 pg — ABNORMAL LOW (ref 26.0–34.0)
MCHC: 30 g/dL (ref 30.0–36.0)
MCV: 74.7 fL — ABNORMAL LOW (ref 78.0–100.0)
Platelets: 292 10*3/uL (ref 150–400)
RBC: 5.57 MIL/uL — ABNORMAL HIGH (ref 3.87–5.11)
RDW: 15.9 % — ABNORMAL HIGH (ref 11.5–15.5)
WBC: 10.6 10*3/uL — ABNORMAL HIGH (ref 4.0–10.5)

## 2014-01-21 LAB — I-STAT TROPONIN, ED
Troponin i, poc: 0.01 ng/mL (ref 0.00–0.08)
Troponin i, poc: 0 ng/mL (ref 0.00–0.08)

## 2014-01-21 LAB — BASIC METABOLIC PANEL
Anion gap: 6 (ref 5–15)
BUN: 12 mg/dL (ref 6–23)
CO2: 30 mmol/L (ref 19–32)
Calcium: 9.8 mg/dL (ref 8.4–10.5)
Chloride: 101 mEq/L (ref 96–112)
Creatinine, Ser: 0.83 mg/dL (ref 0.50–1.10)
GFR calc Af Amer: >90 mL/min (ref >90)
GFR calc non Af Amer: >90 mL/min (ref >90)
Glucose, Bld: 83 mg/dL (ref 70–99)
Potassium: 3.5 mmol/L (ref 3.5–5.1)
Sodium: 137 mmol/L (ref 135–145)

## 2014-01-21 LAB — BRAIN NATRIURETIC PEPTIDE: B Natriuretic Peptide: 19.6 pg/mL (ref 0.0–100.0)

## 2014-01-21 MED ORDER — ONDANSETRON 4 MG PO TBDP
4.0000 mg | ORAL_TABLET | Freq: Once | ORAL | Status: AC
  Administered 2014-01-21: 4 mg via ORAL
  Filled 2014-01-21: qty 1

## 2014-01-21 MED ORDER — HYDROCODONE-ACETAMINOPHEN 5-325 MG PO TABS
1.0000 | ORAL_TABLET | Freq: Once | ORAL | Status: AC
  Administered 2014-01-21: 1 via ORAL
  Filled 2014-01-21: qty 1

## 2014-01-21 MED ORDER — ONDANSETRON HCL 4 MG/2ML IJ SOLN
4.0000 mg | Freq: Once | INTRAMUSCULAR | Status: DC
  Filled 2014-01-21: qty 2

## 2014-01-21 NOTE — ED Notes (Signed)
Pt. On cardiac monitor. 

## 2014-01-21 NOTE — ED Notes (Addendum)
Pt reports intermittent sharp L sided chest pain since today around 1445. Denies pain, nausea, or shortness of breath at this time.  Applesauce and water provided to the patient.

## 2014-01-21 NOTE — ED Provider Notes (Signed)
CSN: [id number]     Arrival date & time 01/21/14  1507 History   First MD Initiated Contact with Patient 01/21/14 1955     Chief Complaint  Patient presents with  . Chest Pain     HPI  She presents for evaluation of left sided chest pain. She had an episode of sharp sudden left anterior chest pain lasted about 5 minutes. She came to the emergency room about a half an hour later. His dinner several hours awaiting placement in a bed. Patient came back one more time for about 5 minutes and this was when she was getting up from a chair. Again lasted less than 5 minutes. That she's been under "a lot of stress". States she "just found out that she has high blood pressure and diabetes. No history of known heart disease.  No recent illness. No fevers chills. No cough sputum production.  Past Medical History  Diagnosis Date  . Asthma   . Thyroid disease   . Hypertension   . Diabetes mellitus without complication    Past Surgical History  Procedure Laterality Date  . Extra digit removed     Family History  Problem Relation Age of Onset  . Hypothyroidism Mother   . Hypertension Other    History  Substance Use Topics  . Smoking status: Current Every Day Smoker -- 0.50 packs/day for 10 years    Types: Cigarettes  . Smokeless tobacco: Never Used  . Alcohol Use: Yes     Comment: Social drinker    OB History    Gravida Para Term Preterm AB TAB SAB Ectopic Multiple Living       Review of Systems  Constitutional: Negative for fever, chills, diaphoresis, appetite change and fatigue.  HENT: Negative for mouth sores, sore throat and trouble swallowing.   Eyes: Negative for visual disturbance.  Respiratory: Negative for cough, chest tightness, shortness of breath and wheezing.   Cardiovascular: Positive for chest pain.  Gastrointestinal: Negative for nausea, vomiting, abdominal pain, diarrhea and abdominal distention.  Endocrine: Negative for polydipsia, polyphagia and  polyuria.  Genitourinary: Negative for dysuria, frequency and hematuria.  Musculoskeletal: Negative for gait problem.  Skin: Negative for color change, pallor and rash.  Neurological: Negative for dizziness, syncope, light-headedness and headaches.  Hematological: Does not bruise/bleed easily.  Psychiatric/Behavioral: Negative for behavioral problems and confusion.      Allergies  Review of patient's allergies indicates no known allergies.  Home Medications   Prior to Admission medications   Medication Sig Start Date End Date Taking? Authorizing Provider  albuterol (VENTOLIN HFA) 108 (90 BASE) MCG/ACT inhaler Inhale 2 puffs into the lungs every 6 (six) hours as needed for wheezing or shortness of breath.   Yes Historical Provider, MD  hydrochlorothiazide (HYDRODIURIL) 25 MG tablet Take 25 mg by mouth daily.   Yes Historical Provider, MD  ibuprofen (ADVIL,MOTRIN) 200 MG tablet Take 400 mg by mouth every 6 (six) hours as needed for moderate pain.   Yes Historical Provider, MD  levothyroxine (SYNTHROID, LEVOTHROID) 25 MCG tablet Take 25 mcg by mouth daily before breakfast.   Yes Historical Provider, MD  amoxicillin (AMOXIL) 500 MG capsule Take 1 capsule (500 mg total) by mouth 3 (three) times daily. Patient not taking: Reported on 01/19/2014 12/27/13   Kathie Dike, PA-C  HYDROcodone-acetaminophen [facility]) 7.5-325 MG per tablet Take 1 tablet by mouth every 4 (four) hours as needed. 12/27/13   Lyla Son  Beverely PaceBryant, PA-C  ibuprofen (ADVIL,MOTRIN) 800 MG tablet Take 1 tablet (800 mg total) by mouth 3 (three) times daily. 12/27/13   Kathie DikeHobson M Bryant, PA-C  naproxen (NAPROSYN) 500 MG tablet Take 1 tablet (500 mg total) by mouth 2 (two) times daily. 01/19/14   Harle BattiestElizabeth Tysinger, NP  oxyCODONE-acetaminophen (PERCOCET/ROXICET) 5-325 MG per tablet Take 1-2 tablets by mouth every 6 (six) hours as needed for moderate pain or severe pain. Patient not taking: Reported on 12/27/2013 08/20/13   Mora BellmanHannah S Merrell,  PA-C  promethazine (PHENERGAN) 25 MG tablet Take 1 tablet (25 mg total) by mouth every 6 (six) hours as needed for nausea or vomiting. Patient not taking: Reported on 12/27/2013 08/20/13   Mora BellmanHannah S Merrell, PA-C   BP 120/57 mmHg  Pulse 83  Temp(Src) 98.3 F (36.8 C) (Oral)  Resp 18  SpO2 98%  LMP 01/08/2014 (Exact Date) Physical Exam  Constitutional: She is oriented to person, place, and time. She appears well-developed and well-nourished. No distress.  Obese black female. Sleeping. When she awakened she reports no pain.  HENT:  Head: Normocephalic.  Eyes: Conjunctivae are normal. Pupils are equal, round, and reactive to light. No scleral icterus.  Neck: Normal range of motion. Neck supple. No thyromegaly present.  Cardiovascular: Normal rate and regular rhythm.  Exam reveals no gallop and no friction rub.   No murmur heard. Pulmonary/Chest: Effort normal and breath sounds normal. No respiratory distress. She has no wheezes. She has no rales.  Abdominal: Soft. Bowel sounds are normal. She exhibits no distension. There is no tenderness. There is no rebound.  Musculoskeletal: Normal range of motion.  Neurological: She is alert and oriented to person, place, and time.  Skin: Skin is warm and dry. No rash noted.  Psychiatric: She has a normal mood and affect. Her behavior is normal.    ED Course  Procedures (including critical care time) Labs Review Labs Reviewed  CBC - Abnormal; Notable for the following:    WBC 10.6 (*)    RBC 5.57 (*)    MCV 74.7 (*)    MCH 22.4 (*)    RDW 15.9 (*)    All other components within normal limits  BASIC METABOLIC PANEL  BRAIN NATRIURETIC PEPTIDE  I-STAT TROPOININ, ED  I-STAT TROPOININ, ED    Imaging Review Dg Chest 2 View (if Patient Has Fever And/or Copd)  01/21/2014   CLINICAL DATA:  34 year old diabetic hypertensive female with history of smoking presenting with left chest pain and shortness of breath. Initial encounter.  EXAM: CHEST  2 VIEW   COMPARISON:  07/04/2012.  FINDINGS: Pulmonary vascular prominence most notable centrally without change.  No segmental infiltrate or pneumothorax.  No plain film evidence of pulmonary malignancy.  Heart size top-normal.  No osseous abnormality.  IMPRESSION: No acute abnormality.  Please see above discussion.   Electronically Signed   By: Bridgett LarssonSteve  Olson M.D.   On: 01/21/2014 15:50     EKG Interpretation None      MDM   Final diagnoses:  Chest wall pain    EKG without acute changes. CBC 10.6. Normal hemoglobin. Normal troponin on arrival, recheck. Chest x-ray normal. Asymptomatic on exam. Think should appropriate for outpatient expectant management.     Rolland PorterMark Samaria Anes, MD 01/21/14 2241

## 2014-01-21 NOTE — ED Notes (Signed)
Pt reports she began to have central and L chest pain that began at 1445. Pt was sitting when pain began. Reports sob, able to speak in complete sentences. No nausea or dizziness. Has had cough at home.

## 2014-01-21 NOTE — ED Notes (Signed)
Pt alert,oriented, and ambulatory upon DC. 

## 2014-01-21 NOTE — Discharge Instructions (Signed)

## 2014-05-05 ENCOUNTER — Ambulatory Visit: Payer: No Typology Code available for payment source | Admitting: Obstetrics & Gynecology

## 2014-07-27 ENCOUNTER — Encounter (HOSPITAL_COMMUNITY): Payer: Self-pay

## 2014-07-27 ENCOUNTER — Emergency Department (HOSPITAL_COMMUNITY)
Admission: EM | Admit: 2014-07-27 | Discharge: 2014-07-27 | Disposition: A | Discharge status: Home / Self Care | Payer: 59 | Source: Home / Self Care | Attending: Emergency Medicine | Admitting: Emergency Medicine

## 2014-07-27 ENCOUNTER — Emergency Department (INDEPENDENT_AMBULATORY_CARE_PROVIDER_SITE_OTHER): Payer: 59

## 2014-07-27 DIAGNOSIS — Z720 Tobacco use: Secondary | ICD-10-CM

## 2014-07-27 DIAGNOSIS — J4541 Moderate persistent asthma with (acute) exacerbation: Secondary | ICD-10-CM | POA: Diagnosis not present

## 2014-07-27 DIAGNOSIS — R0789 Other chest pain: Secondary | ICD-10-CM

## 2014-07-27 MED ORDER — PREDNISONE 20 MG PO TABS: ORAL_TABLET | ORAL | Status: DC

## 2014-07-27 MED ORDER — ALBUTEROL SULFATE (2.5 MG/3ML) 0.083% IN NEBU
INHALATION_SOLUTION | RESPIRATORY_TRACT | Status: AC
  Filled 2014-07-27: qty 3

## 2014-07-27 MED ORDER — TRIAMCINOLONE ACETONIDE 40 MG/ML IJ SUSP
60.0000 mg | Freq: Once | INTRAMUSCULAR | Status: AC
  Administered 2014-07-27: 60 mg via INTRAMUSCULAR

## 2014-07-27 MED ORDER — ALBUTEROL SULFATE (2.5 MG/3ML) 0.083% IN NEBU
5.0000 mg | INHALATION_SOLUTION | Freq: Once | RESPIRATORY_TRACT | Status: AC
  Administered 2014-07-27: 5 mg via RESPIRATORY_TRACT

## 2014-07-27 MED ORDER — TRIAMCINOLONE ACETONIDE 40 MG/ML IJ SUSP
INTRAMUSCULAR | Status: AC
  Filled 2014-07-27: qty 2

## 2014-07-27 MED ORDER — ALBUTEROL SULFATE (2.5 MG/3ML) 0.083% IN NEBU
2.5000 mg | INHALATION_SOLUTION | Freq: Once | RESPIRATORY_TRACT | Status: AC
  Administered 2014-07-27: 2.5 mg via RESPIRATORY_TRACT

## 2014-07-27 MED ORDER — ALBUTEROL SULFATE (2.5 MG/3ML) 0.083% IN NEBU
INHALATION_SOLUTION | RESPIRATORY_TRACT | Status: AC
  Filled 2014-07-27: qty 6

## 2014-07-27 MED ORDER — IPRATROPIUM-ALBUTEROL 0.5-2.5 (3) MG/3ML IN SOLN
3.0000 mL | Freq: Once | RESPIRATORY_TRACT | Status: AC
  Administered 2014-07-27: 3 mL via RESPIRATORY_TRACT

## 2014-07-27 MED ORDER — BECLOMETHASONE DIPROPIONATE 80 MCG/ACT IN AERS: 2.0000 | INHALATION_SPRAY | Freq: Two times a day (BID) | RESPIRATORY_TRACT | Status: DC

## 2014-07-27 MED ORDER — IPRATROPIUM-ALBUTEROL 0.5-2.5 (3) MG/3ML IN SOLN
RESPIRATORY_TRACT | Status: AC
  Filled 2014-07-27: qty 3

## 2014-07-27 NOTE — ED Notes (Signed)
Went to get patient for CXR, was receiving breathing treatment

## 2014-07-27 NOTE — ED Notes (Addendum)
C/o cough x 3 days , worsening in spite of using cough drops. history of asthma , but had a pump left over from last time she had a cough from bronchitis . scattered wheeze on ascultation. No improvement when using left over MDI. Able to speak in complete sentences. Pain worse w cough or deep breath, yellow secretions and yellow nasal secretions. Chest discomfort w palpation to costochondral margins, sternum

## 2014-07-27 NOTE — ED Provider Notes (Signed)
CSN: [id number]     Arrival date & time 07/27/14  1743 History   First MD Initiated Contact with Patient 07/27/14 1824     Chief Complaint  Patient presents with  . Cough   (Consider location/radiation/quality/duration/timing/severity/associated sxs/prior Treatment) HPI Comments: 34 year old female complaining of chest pain, cough and wheezing for 3 days. She is also complaining of insomnia because she has so many things on her mind. She is morbidly obese, smokes, diagnosed with asthma and utilizes albuterol about once a day for her breathing. She states since this does not work as well as it used to she does not use it as much. She states the chest pain hurts when coughing, taking a deep breath and with various upper body movements.   Past Medical History  Diagnosis Date  . Asthma   . Thyroid disease   . Hypertension   . Diabetes mellitus without complication    Past Surgical History  Procedure Laterality Date  . Extra digit removed     Family History  Problem Relation Age of Onset  . Hypothyroidism Mother   . Hypertension Other    History  Substance Use Topics  . Smoking status: Current Every Day Smoker -- 0.50 packs/day for 10 years    Types: Cigarettes  . Smokeless tobacco: Never Used  . Alcohol Use: Yes     Comment: Social drinker    OB History    Gravida Para Term Preterm AB TAB SAB Ectopic Multiple Living   0 0 0 0 0 0 0 0 0 0      Review of Systems  Constitutional: Positive for activity change. Negative for fever and fatigue.  HENT: Negative.   Respiratory: Positive for cough, shortness of breath and wheezing.   Cardiovascular: Positive for chest pain.  Gastrointestinal: Negative.   Musculoskeletal: Negative.   Skin: Negative.   Neurological: Negative.     Allergies  Review of patient's allergies indicates no known allergies.  Home Medications   Prior to Admission medications   Medication Sig Start Date End Date Taking? Authorizing Provider  albuterol  (VENTOLIN HFA) 108 (90 BASE) MCG/ACT inhaler Inhale 2 puffs into the lungs every 6 (six) hours as needed for wheezing or shortness of breath.   Yes Historical Provider, MD  hydrochlorothiazide (HYDRODIURIL) 25 MG tablet Take 25 mg by mouth daily.   Yes Historical Provider, MD  levothyroxine (SYNTHROID, LEVOTHROID) 25 MCG tablet Take 25 mcg by mouth daily before breakfast.   Yes Historical Provider, MD  lisinopril (PRINIVIL,ZESTRIL) 2.5 MG tablet Take 2.5 mg by mouth daily.   Yes Historical Provider, MD  beclomethasone (QVAR) 80 MCG/ACT inhaler Inhale 2 puffs into the lungs 2 (two) times daily. 07/27/14   Hayden Rasmussenavid Meline Russaw, NP  HYDROcodone-acetaminophen (NORCO) 7.5-325 MG per tablet Take 1 tablet by mouth every 4 (four) hours as needed. 12/27/13   Ivery QualeHobson Bryant, PA-C  ibuprofen (ADVIL,MOTRIN) 200 MG tablet Take 400 mg by mouth every 6 (six) hours as needed for moderate pain.    Historical Provider, MD  ibuprofen (ADVIL,MOTRIN) 800 MG tablet Take 1 tablet (800 mg total) by mouth 3 (three) times daily. 12/27/13   Ivery QualeHobson Bryant, PA-C  naproxen (NAPROSYN) 500 MG tablet Take 1 tablet (500 mg total) by mouth 2 (two) times daily. 01/19/14   Harle BattiestElizabeth Tysinger, NP  predniSONE (DELTASONE) 20 MG tablet Take 3 tabs po on first day, 2 tabs second day, 2 tabs third day, 1 tab fourth day, 1 tab 5th day. Take with food. Start 07/28/2014. 07/27/14  Hayden Rasmussen, NP   BP 149/90 mmHg  Pulse 108  Temp(Src) 100.8 F (38.2 C) (Oral)  Resp 16  SpO2 95%  LMP 07/14/2014 Physical Exam  Constitutional: She is oriented to person, place, and time. She appears well-developed and well-nourished. No distress.  HENT:  Right Ear: External ear normal.  Left Ear: External ear normal.  Mouth/Throat: Oropharynx is clear and moist. No oropharyngeal exudate.  Eyes: Conjunctivae and EOM are normal.  Neck: Normal range of motion. Neck supple.  Cardiovascular: Normal rate, regular rhythm and normal heart sounds.   Pulmonary/Chest: Effort  normal. She has wheezes. She exhibits tenderness.  Generally poor air movement bilaterally. Expiratory phase is prolonged. Bilateral wheezing.  Musculoskeletal: She exhibits no edema.  Lymphadenopathy:    She has no cervical adenopathy.  Neurological: She is alert and oriented to person, place, and time. She exhibits normal muscle tone.  Skin: Skin is warm and dry. No erythema.  Psychiatric: She has a normal mood and affect.  Nursing note and vitals reviewed.   ED Course  Procedures (including critical care time) Labs Review Labs Reviewed - No data to display  Imaging Review Dg Chest 2 View  07/27/2014   CLINICAL DATA:  Fever, cough for 3 days, smoker, history of asthma  EXAM: CHEST  2 VIEW  COMPARISON:  01/21/2014  FINDINGS: Cardiomediastinal silhouette is stable. No acute infiltrate or pleural effusion. No pulmonary edema. Suboptimal study due to patient's large body habitus.  IMPRESSION: No active cardiopulmonary disease.   Electronically Signed   By: Natasha Mead M.D.   On: 07/27/2014 20:23     MDM   1. Asthma exacerbation attacks, moderate persistent   2. Chest wall pain   3. Tobacco abuse disorder     Post DuoNeb 5 mg/2.5 mg patient states she is breathing a little better. She continues to have much wheezing with prolonged expiratory phase Repeat a albuterol 5 mg neb at 7:55 hours. Status post this second neb patient has much improved response. Wheezes are significantly diminished and air movement has increased. Kenalog 60 mg IM Prednisone taper dose starting tomorrow Start Qvar tomorrow and daily User albuterol HFA 2 puffs every 4 hours For worsening new symptoms or problems may need to go to the emergency department Stop smoking   Hayden Rasmussen, NP 07/27/14 2030

## 2014-07-27 NOTE — Discharge Instructions (Signed)
Asthma, Acute Bronchospasm °Acute bronchospasm caused by asthma is also referred to as an asthma attack. Bronchospasm means your air passages become narrowed. The narrowing is caused by inflammation and tightening of the muscles in the air tubes (bronchi) in your lungs. This can make it hard to breathe or cause you to wheeze and cough. °CAUSES °Possible triggers are: °· Animal dander from the skin, hair, or feathers of animals. °· Dust mites contained in house dust. °· Cockroaches. °· Pollen from trees or grass. °· Mold. °· Cigarette or tobacco smoke. °· Air pollutants such as dust, household cleaners, hair sprays, aerosol sprays, paint fumes, strong chemicals, or strong odors. °· Cold air or weather changes. Cold air may trigger inflammation. Winds increase molds and pollens in the air. °· Strong emotions such as crying or laughing hard. °· Stress. °· Certain medicines such as aspirin or beta-blockers. °· Sulfites in foods and drinks, such as dried fruits and wine. °· Infections or inflammatory conditions, such as a flu, cold, or inflammation of the nasal membranes (rhinitis). °· Gastroesophageal reflux disease (GERD). GERD is a condition where stomach acid backs up into your esophagus. °· Exercise or strenuous activity. °SIGNS AND SYMPTOMS  °· Wheezing. °· Excessive coughing, particularly at night. °· Chest tightness. °· Shortness of breath. °DIAGNOSIS  °Your health care provider will ask you about your medical history and perform a physical exam. A chest X-ray or blood testing may be performed to look for other causes of your symptoms or other conditions that may have triggered your asthma attack.  °TREATMENT  °Treatment is aimed at reducing inflammation and opening up the airways in your lungs.  Most asthma attacks are treated with inhaled medicines. These include quick relief or rescue medicines (such as bronchodilators) and controller medicines (such as inhaled corticosteroids). These medicines are sometimes  given through an inhaler or a nebulizer. Systemic steroid medicine taken by mouth or given through an IV tube also can be used to reduce the inflammation when an attack is moderate or severe. Antibiotic medicines are only used if a bacterial infection is present.  °HOME CARE INSTRUCTIONS  °· Rest. °· Drink plenty of liquids. This helps the mucus to remain thin and be easily coughed up. Only use caffeine in moderation and do not use alcohol until you have recovered from your illness. °· Do not smoke. Avoid being exposed to secondhand smoke. °· You play a critical role in keeping yourself in good health. Avoid exposure to things that cause you to wheeze or to have breathing problems. °· Keep your medicines up-to-date and available. Carefully follow your health care provider's treatment plan. °· Take your medicine exactly as prescribed. °· When pollen or pollution is bad, keep windows closed and use an air conditioner or go to places with air conditioning. °· Asthma requires careful medical care. See your health care provider for a follow-up as advised. If you are more than [redacted] weeks pregnant and you were prescribed any new medicines, let your obstetrician know about the visit and how you are doing. Follow up with your health care provider as directed. °· After you have recovered from your asthma attack, make an appointment with your outpatient doctor to talk about ways to reduce the likelihood of future attacks. If you do not have a doctor who manages your asthma, make an appointment with a primary care doctor to discuss your asthma. °SEEK IMMEDIATE MEDICAL CARE IF:  °· You are getting worse. °· You have trouble breathing. If severe, call your local   emergency services (911 in the [place]  You develop chest pain or discomfort.  You are vomiting.  You are not able to keep fluids down.  You are coughing up yellow, green, brown, or bloody sputum.  You have a fever and your symptoms suddenly get worse.  You have  trouble swallowing. MAKE SURE YOU:   Understand these instructions.  Will watch your condition.  Will get help right away if you are not doing well or get worse. Document Released: 04/18/2006 Document Revised: 01/06/2013 Document Reviewed: 07/09/2012 [facility] Patient Information 2015 [place], [place]. This information is not intended to replace advice given to you by your health care provider. Make sure you discuss any questions you have with your health care provider.  Chest Wall Pain Chest wall pain is pain in or around the bones and muscles of your chest. It may take up to 6 weeks to get better. It may take longer if you must stay physically active in your work and activities.  CAUSES  Chest wall pain may happen on its own. However, it may be caused by:  A viral illness like the flu.  Injury.  Coughing.  Exercise.  Arthritis.  Fibromyalgia.  Shingles. HOME CARE INSTRUCTIONS   Avoid overtiring physical activity. Try not to strain or perform activities that cause pain. This includes any activities using your chest or your abdominal and side muscles, especially if heavy weights are used.  Put ice on the sore area.  Put ice in a plastic bag.  Place a towel between your skin and the bag.  Leave the ice on for 15-20 minutes per hour while awake for the first 2 days.  Only take over-the-counter or prescription medicines for pain, discomfort, or fever as directed by your caregiver. SEEK IMMEDIATE MEDICAL CARE IF:   Your pain increases, or you are very uncomfortable.  You have a fever.  Your chest pain becomes worse.  You have new, unexplained symptoms.  You have nausea or vomiting.  You feel sweaty or lightheaded.  You have a cough with phlegm (sputum), or you cough up blood. MAKE SURE YOU:   Understand these instructions.  Will watch your condition.  Will get help right away if you are not doing well or get worse. Document Released: 01/01/2005 Document  Revised: 03/26/2011 Document Reviewed: 08/28/2010 [facility] Patient Information 2015 [place], [place]. This information is not intended to replace advice given to you by your health care provider. Make sure you discuss any questions you have with your health care provider.  How to Use an Inhaler Using your inhaler correctly is very important. Good technique will make sure that the medicine reaches your lungs.  HOW TO USE AN INHALER:  Take the cap off the inhaler.  If this is the first time using your inhaler, you need to prime it. Shake the inhaler for 5 seconds. Release four puffs into the air, away from your face. Ask your doctor for help if you have questions.  Shake the inhaler for 5 seconds.  Turn the inhaler so the bottle is above the mouthpiece.  Put your pointer finger on top of the bottle. Your thumb holds the bottom of the inhaler.  Open your mouth.  Either hold the inhaler away from your mouth (the width of 2 fingers) or place your lips tightly around the mouthpiece. Ask your doctor which way to use your inhaler.  Breathe out as much air as possible.  Breathe in and push down on the bottle 1 time to  release the medicine. You will feel the medicine go in your mouth and throat.  Continue to take a deep breath in very slowly. Try to fill your lungs.  After you have breathed in completely, hold your breath for 10 seconds. This will help the medicine to settle in your lungs. If you cannot hold your breath for 10 seconds, hold it for as long as you can before you breathe out.  Breathe out slowly, through pursed lips. Whistling is an example of pursed lips.  If your doctor has told you to take more than 1 puff, wait at least 15-30 seconds between puffs. This will help you get the best results from your medicine. Do not use the inhaler more than your doctor tells you to.  Put the cap back on the inhaler.  Follow the directions from your doctor or from the inhaler package about  cleaning the inhaler. If you use more than one inhaler, ask your doctor which inhalers to use and what order to use them in. Ask your doctor to help you figure out when you will need to refill your inhaler.  If you use a steroid inhaler, always rinse your mouth with water after your last puff, gargle and spit out the water. Do not swallow the water. GET HELP IF:  The inhaler medicine only partially helps to stop wheezing or shortness of breath.  You are having trouble using your inhaler.  You have some increase in thick spit (phlegm). GET HELP RIGHT AWAY IF:  The inhaler medicine does not help your wheezing or shortness of breath or you have tightness in your chest.  You have dizziness, headaches, or fast heart rate.  You have chills, fever, or night sweats.  You have a large increase of thick spit, or your thick spit is bloody. MAKE SURE YOU:   Understand these instructions.  Will watch your condition.  Will get help right away if you are not doing well or get worse. Document Released: 10/11/2007 Document Revised: 10/22/2012 Document Reviewed: 07/31/2012 [facility] Patient Information 2015 [place], [place]. This information is not intended to replace advice given to you by your health care provider. Make sure you discuss any questions you have with your health care provider.  Smoking Cessation Quitting smoking is important to your health and has many advantages. However, it is not always easy to quit since nicotine is a very addictive drug. Oftentimes, people try 3 times or more before being able to quit. This document explains the best ways for you to prepare to quit smoking. Quitting takes hard work and a lot of effort, but you can do it. ADVANTAGES OF QUITTING SMOKING  You will live longer, feel better, and live better.  Your body will feel the impact of quitting smoking almost immediately.  Within 20 minutes, blood pressure decreases. Your pulse returns to its normal  level.  After 8 hours, carbon monoxide levels in the blood return to normal. Your oxygen level increases.  After 24 hours, the chance of having a heart attack starts to decrease. Your breath, hair, and body stop smelling like smoke.  After 48 hours, damaged nerve endings begin to recover. Your sense of taste and smell improve.  After 72 hours, the body is virtually free of nicotine. Your bronchial tubes relax and breathing becomes easier.  After 2 to 12 weeks, lungs can hold more air. Exercise becomes easier and circulation improves.  The risk of having a heart attack, stroke, cancer, or lung disease is greatly  reduced.  After 1 year, the risk of coronary heart disease is cut in half.  After 5 years, the risk of stroke falls to the same as a nonsmoker.  After 10 years, the risk of lung cancer is cut in half and the risk of other cancers decreases significantly.  After 15 years, the risk of coronary heart disease drops, usually to the level of a nonsmoker.  If you are pregnant, quitting smoking will improve your chances of having a healthy baby.  The people you live with, especially any children, will be healthier.  You will have extra money to spend on things other than cigarettes. QUESTIONS TO THINK ABOUT BEFORE ATTEMPTING TO QUIT You may want to talk about your answers with your health care provider.  Why do you want to quit?  If you tried to quit in the past, what helped and what did not?  What will be the most difficult situations for you after you quit? How will you plan to handle them?  Who can help you through the tough times? Your family? Friends? A health care provider?  What pleasures do you get from smoking? What ways can you still get pleasure if you quit? Here are some questions to ask your health care provider:  How can you help me to be successful at quitting?  What medicine do you think would be best for me and how should I take it?  What should I do if I  need more help?  What is smoking withdrawal like? How can I get information on withdrawal? GET READY  Set a quit date.  Change your environment by getting rid of all cigarettes, ashtrays, matches, and lighters in your home, car, or work. Do not let people smoke in your home.  Review your past attempts to quit. Think about what worked and what did not. GET SUPPORT AND ENCOURAGEMENT You have a better chance of being successful if you have help. You can get support in many ways.  Tell your family, friends, and coworkers that you are going to quit and need their support. Ask them not to smoke around you.  Get individual, group, or telephone counseling and support. Programs are available at [employer] and health centers. Call your local health department for information about programs in your area.  Spiritual beliefs and practices may help some smokers quit.  Download a "quit meter" on your computer to keep track of quit statistics, such as how long you have gone without smoking, cigarettes not smoked, and money saved.  Get a self-help book about quitting smoking and staying off tobacco. LEARN NEW SKILLS AND BEHAVIORS  Distract yourself from urges to smoke. Talk to someone, go for a walk, or occupy your time with a task.  Change your normal routine. Take a different route to work. Drink tea instead of coffee. Eat breakfast in a different place.  Reduce your stress. Take a hot bath, exercise, or read a book.  Plan something enjoyable to do every day. Reward yourself for not smoking.  Explore interactive web-based programs that specialize in helping you quit. GET MEDICINE AND USE IT CORRECTLY Medicines can help you stop smoking and decrease the urge to smoke. Combining medicine with the above behavioral methods and support can greatly increase your chances of successfully quitting smoking.  Nicotine replacement therapy helps deliver nicotine to your body without the negative effects  and risks of smoking. Nicotine replacement therapy includes nicotine gum, lozenges, inhalers, nasal sprays, and skin patches.  Some may be available over-the-counter and others require a prescription.  Antidepressant medicine helps people abstain from smoking, but how this works is unknown. This medicine is available by prescription.  Nicotinic receptor partial agonist medicine simulates the effect of nicotine in your brain. This medicine is available by prescription. Ask your health care provider for advice about which medicines to use and how to use them based on your health history. Your health care provider will tell you what side effects to look out for if you choose to be on a medicine or therapy. Carefully read the information on the package. Do not use any other product containing nicotine while using a nicotine replacement product.  RELAPSE OR DIFFICULT SITUATIONS Most relapses occur within the first 3 months after quitting. Do not be discouraged if you start smoking again. Remember, most people try several times before finally quitting. You may have symptoms of withdrawal because your body is used to nicotine. You may crave cigarettes, be irritable, feel very hungry, cough often, get headaches, or have difficulty concentrating. The withdrawal symptoms are only temporary. They are strongest when you first quit, but they will go away within 10-14 days. To reduce the chances of relapse, try to:  Avoid drinking alcohol. Drinking lowers your chances of successfully quitting.  Reduce the amount of caffeine you consume. Once you quit smoking, the amount of caffeine in your body increases and can give you symptoms, such as a rapid heartbeat, sweating, and anxiety.  Avoid smokers because they can make you want to smoke.  Do not let weight gain distract you. Many smokers will gain weight when they quit, usually less than 10 pounds. Eat a healthy diet and stay active. You can always lose the weight  gained after you quit.  Find ways to improve your mood other than smoking. FOR MORE INFORMATION  www.smokefree.gov  Document Released: 12/26/2000 Document Revised: 05/18/2013 Document Reviewed: 04/12/2011 [facility] Patient Information 2015 [place], [place]. This information is not intended to replace advice given to you by your health care provider. Make sure you discuss any questions you have with your health care provider.

## 2014-11-04 ENCOUNTER — Encounter (HOSPITAL_COMMUNITY): Payer: Self-pay | Admitting: *Deleted

## 2014-11-04 ENCOUNTER — Emergency Department (HOSPITAL_COMMUNITY)
Admission: EM | Admit: 2014-11-04 | Discharge: 2014-11-04 | Disposition: A | Discharge status: Home / Self Care | Payer: 59 | Attending: Emergency Medicine | Admitting: Emergency Medicine

## 2014-11-04 DIAGNOSIS — E079 Disorder of thyroid, unspecified: Secondary | ICD-10-CM | POA: Insufficient documentation

## 2014-11-04 DIAGNOSIS — Z79899 Other long term (current) drug therapy: Secondary | ICD-10-CM | POA: Diagnosis not present

## 2014-11-04 DIAGNOSIS — Z7951 Long term (current) use of inhaled steroids: Secondary | ICD-10-CM | POA: Insufficient documentation

## 2014-11-04 DIAGNOSIS — Z791 Long term (current) use of non-steroidal anti-inflammatories (NSAID): Secondary | ICD-10-CM | POA: Diagnosis not present

## 2014-11-04 DIAGNOSIS — I10 Essential (primary) hypertension: Secondary | ICD-10-CM | POA: Insufficient documentation

## 2014-11-04 DIAGNOSIS — R600 Localized edema: Secondary | ICD-10-CM | POA: Diagnosis not present

## 2014-11-04 DIAGNOSIS — R200 Anesthesia of skin: Secondary | ICD-10-CM | POA: Diagnosis not present

## 2014-11-04 DIAGNOSIS — R609 Edema, unspecified: Secondary | ICD-10-CM

## 2014-11-04 DIAGNOSIS — E119 Type 2 diabetes mellitus without complications: Secondary | ICD-10-CM | POA: Diagnosis not present

## 2014-11-04 DIAGNOSIS — Z720 Tobacco use: Secondary | ICD-10-CM | POA: Insufficient documentation

## 2014-11-04 DIAGNOSIS — J45909 Unspecified asthma, uncomplicated: Secondary | ICD-10-CM | POA: Insufficient documentation

## 2014-11-04 DIAGNOSIS — M79671 Pain in right foot: Secondary | ICD-10-CM | POA: Diagnosis present

## 2014-11-04 MED ORDER — HYDROCODONE-ACETAMINOPHEN 5-325 MG PO TABS
1.0000 | ORAL_TABLET | Freq: Once | ORAL | Status: AC
  Administered 2014-11-04: 1 via ORAL
  Filled 2014-11-04: qty 1

## 2014-11-04 MED ORDER — IBUPROFEN 800 MG PO TABS
800.0000 mg | ORAL_TABLET | Freq: Once | ORAL | Status: AC
  Administered 2014-11-04: 800 mg via ORAL
  Filled 2014-11-04: qty 1

## 2014-11-04 MED ORDER — IBUPROFEN 800 MG PO TABS: 800.0000 mg | ORAL_TABLET | Freq: Three times a day (TID) | ORAL | Status: DC

## 2014-11-04 MED ORDER — HYDROCODONE-ACETAMINOPHEN 5-325 MG PO TABS: 1.0000 | ORAL_TABLET | ORAL | Status: DC | PRN

## 2014-11-04 NOTE — ED Provider Notes (Signed)
CSN: [id number]     Arrival date & time 11/04/14  4098 History   By signing my name below, I, [name], attest that this documentation has been prepared under the direction and in the presence of Elpidio Anis, PA-C Electronically Signed: [name], ED Scribe. 11/04/2014. 8:36 PM.   Chief Complaint  Patient presents with  . Leg Pain    bilat  . Foot Pain    bilat     The history is provided by the patient. No language interpreter was used.     [name] is a 34 y.o. female with a medical hx of HTN and arthritis who presents to the Emergency Department complaining of a progressively worsening throbbing bilateral foot pain onset 3 weeks. She reports that her bilateral foot pain radiates to her bilateral legs. She states that the swelling stays in her feet constantly and nothing relieves them. She notes that her symptoms have been going on for awhile and she has been in the area for 2 years and does not have a PCP. She notes that her [facility] PCP [facility] at [facility]. She notes that she tried to be seen today at urgent care but she got there too late. Pt is having associated symptoms of intermittent numbness in both feet and bilateral feet swelling. She notes that she has not tried any medications for the relief of her symptoms. She denies color change, rash, wound, and any other symptoms. She notes that she take metformin for her pre-diabetes. She notes that she takes levothyroxine and metformin daily. Denies allergies to medications.   Past Medical History  Diagnosis Date  . Asthma   . Thyroid disease   . Hypertension   . Diabetes mellitus without complication [facility])    Past Surgical History  Procedure Laterality Date  . Extra digit removed     Family History  Problem Relation Age of Onset  . Hypothyroidism Mother   . Hypertension Other    Social History  Substance Use Topics  . Smoking status: Current Every Day Smoker -- 0.50 packs/day for 10 years     Types: Cigarettes  . Smokeless tobacco: Never Used  . Alcohol Use: Yes     Comment: Social drinker    OB History    Gravida Para Term Preterm AB TAB SAB Ectopic Multiple Living       Review of Systems  Musculoskeletal: Positive for joint swelling and arthralgias. Negative for gait problem.  Skin: Negative for color change, rash and wound.  Neurological: Positive for numbness.      Allergies  Review of patient's allergies indicates no known allergies.  Home Medications   Prior to Admission medications   Medication Sig Start Date End Date Taking? Authorizing Provider  albuterol (VENTOLIN HFA) 108 (90 BASE) MCG/ACT inhaler Inhale 2 puffs into the lungs every 6 (six) hours as needed for wheezing or shortness of breath.    Historical Provider, MD  beclomethasone (QVAR) 80 MCG/ACT inhaler Inhale 2 puffs into the lungs 2 (two) times daily. 07/27/14   Hayden Rasmussen, NP  hydrochlorothiazide (HYDRODIURIL) 25 MG tablet Take 25 mg by mouth daily.    Historical Provider, MD  HYDROcodone-acetaminophen (NORCO) 7.5-325 MG per tablet Take 1 tablet by mouth every 4 (four) hours as needed. 12/27/13   Ivery Quale, PA-C  ibuprofen (ADVIL,MOTRIN) 200 MG tablet Take 400 mg by mouth every 6 (six) hours as needed for moderate pain.  Historical Provider, MD  ibuprofen (ADVIL,MOTRIN) 800 MG tablet Take 1 tablet (800 mg total) by mouth 3 (three) times daily. 12/27/13   Ivery QualeHobson Bryant, PA-C  levothyroxine (SYNTHROID, LEVOTHROID) 25 MCG tablet Take 25 mcg by mouth daily before breakfast.    Historical Provider, MD  lisinopril (PRINIVIL,ZESTRIL) 2.5 MG tablet Take 2.5 mg by mouth daily.    Historical Provider, MD  naproxen (NAPROSYN) 500 MG tablet Take 1 tablet (500 mg total) by mouth 2 (two) times daily. 01/19/14   Harle BattiestElizabeth Tysinger, NP  predniSONE (DELTASONE) 20 MG tablet Take 3 tabs po on first day, 2 tabs second day, 2 tabs third day, 1 tab fourth day, 1 tab 5th day. Take with food. Start  07/28/2014. 07/27/14   Hayden Rasmussenavid Mabe, NP   BP 142/88 mmHg  Pulse 79  Temp(Src) 98.7 F (37.1 C) (Temporal)  Resp 17  Ht 5\' 3"  (1.6 m)  Wt 260 lb (117.935 kg)  BMI 46.07 kg/m2  SpO2 97% Physical Exam  Constitutional: She is oriented to person, place, and time. She appears well-developed and well-nourished. No distress.  HENT:  Head: Normocephalic and atraumatic.  Eyes: EOM are normal.  Neck: Neck supple.  Cardiovascular: Normal rate.   Pulmonary/Chest: Effort normal. No respiratory distress.  Abdominal: Soft. There is no tenderness.  Musculoskeletal: Normal range of motion.  Minimal edema to dorsum of bilateral feet without redness or warmth. No focal tenderness. No lower leg edema. NVI. Fully weight bearing.   Neurological: She is alert and oriented to person, place, and time.  Skin: Skin is warm and dry.  Psychiatric: She has a normal mood and affect. Her behavior is normal.  Nursing note and vitals reviewed.   ED Course  Procedures (including critical care time) DIAGNOSTIC STUDIES: Oxygen Saturation is 100% on RA, nl by my interpretation.    COORDINATION OF CARE: 8:12 PM Discussed treatment plan with pt at bedside which includes anti-inflammatory Rx, pain medication Rx,and pt agreed to plan.    Labs Review Labs Reviewed - No data to display  Imaging Review No results found.    EKG Interpretation None      MDM   Final diagnoses:  None    1. Peripheral edema  No SOB, significant edema, evidence of infection or injury. She can be discharged home with PCP follow up. Resources provided.  I personally performed the services described in this documentation, which was scribed in my presence. The recorded information has been reviewed and is accurate.     Elpidio AnisShari Areanna Gengler, PA-C 11/13/14 [id number]  Leta BaptistEmily Roe Nguyen, MD 11/13/14 2258

## 2014-11-04 NOTE — Discharge Instructions (Signed)
YOU NEED TO FIND A PRIMARY CARE PROVIDER THAT IS EASILY ACCESSIBLE TO YOU FOR FOLLOW UP OUTPATIENT CARE, INCLUDING RE-EVALUATION OF FOOT SWELLING.  Peripheral Edema You have swelling in your legs (peripheral edema). This swelling is due to excess accumulation of salt and water in your body. Edema may be a sign of heart, kidney or liver disease, or a side effect of a medication. It may also be due to problems in the leg veins. Elevating your legs and using special support stockings may be very helpful, if the cause of the swelling is due to poor venous circulation. Avoid long periods of standing, whatever the cause. Treatment of edema depends on identifying the cause. Chips, pretzels, pickles and other salty foods should be avoided. Restricting salt in your diet is almost always needed. Water pills (diuretics) are often used to remove the excess salt and water from your body via urine. These medicines prevent the kidney from reabsorbing sodium. This increases urine flow. Diuretic treatment may also result in lowering of potassium levels in your body. Potassium supplements may be needed if you have to use diuretics daily. Daily weights can help you keep track of your progress in clearing your edema. You should call your caregiver for follow up care as recommended. SEEK IMMEDIATE MEDICAL CARE IF:   You have increased swelling, pain, redness, or heat in your legs.  You develop shortness of breath, especially when lying down.  You develop chest or abdominal pain, weakness, or fainting.  You have a fever.   This information is not intended to replace advice given to you by your health care provider. Make sure you discuss any questions you have with your health care provider.   Document Released: 02/09/2004 Document Revised: 03/26/2011 Document Reviewed: 07/14/2014 Elsevier Interactive Patient Education 2016 [employer].  Emergency Department Resource Guide 1) Find a Doctor and Pay Out of  Pocket Although you won't have to find out who is covered by your insurance plan, it is a good idea to ask around and get recommendations. You will then need to call the office and see if the doctor you have chosen will accept you as a new patient and what types of options they offer for patients who are self-pay. Some doctors offer discounts or will set up payment plans for their patients who do not have insurance, but you will need to ask so you aren't surprised when you get to your appointment.  2) Contact Your Local Health Department Not all health departments have doctors that can see patients for sick visits, but many do, so it is worth a call to see if yours does. If you don't know where your local health department is, you can check in your phone book. The CDC also has a tool to help you locate your state's health department, and many state websites also have listings of all of their local health departments.  3) Find a Walk-in Clinic If your illness is not likely to be very severe or complicated, you may want to try a walk in clinic. These are popping up all over the country in pharmacies, drugstores, and shopping centers. They're usually staffed by nurse practitioners or physician assistants that have been trained to treat common illnesses and complaints. They're usually fairly quick and inexpensive. However, if you have serious medical issues or chronic medical problems, these are probably not your best option.  No Primary Care Doctor: - Call Health Connect at  [phone] - they can help you locate a  primary care doctor that  accepts your insurance, provides certain services, etc. - Physician Referral Service- [phone]  Chronic Pain Problems: Organization         Address  Phone   Notes  [facility]  [phone] Patients need to be referred by their primary care doctor.   Medication Assistance: Organization         Address  Phone   Notes  [facility]  Medication [facility] [address] [place], [place] [address] [phone] --Must be a resident of [facility] -- Must have NO insurance coverage whatsoever (no Medicaid/ Medicare, etc.) -- The pt. MUST have a primary care doctor that directs their care regularly and follows them in the community   [facility]ist  [phone]   [employer]  [phone]    Agencies that provide inexpensive medical care: Organization         Address  Phone   Notes  [facility]  [phone]   [facility]    [phone]   [facility] [address] [place], [place] [address] [phone]   [facility] [address] [place] [place] [phone]   Planned Parenthood    [phone]   [facility]    [phone]   [facility] and [facility]  [address], [place] Phone:  [phone], Fax:  [phone] Hours of Operation:  9 am - 6 pm, M-F.  Also accepts Medicaid/Medicare and self-pay.  [facility]  [address], [place] Phone: [phone], Fax: [phone]. Hours of Operation:  8:30 am - 5:30 pm, M-F.  Also accepts Medicaid and self-pay.  [facility] [place] [address] [place] Phone: [phone]   [facility] [address] Natasha Bence [place], [place] [phone], Ext. [phone] Mondays & Thursdays: 7-9 AM.  First 15 patients are seen on a first come, first serve basis.    Medicaid-accepting [facility] Providers:  Organization         Address  Phone   Notes  [facility] [address], [place] [phone] Also accepts self-pay patients.  [facility] [address] Laurell Josephs [place], [place]  [phone]   [facility] [address], [place] [phone]   [facility] [address], [place] [phone]   Renaye Rakers [address], [place]   [phone] Only accepts [place] patients after they have their name applied to their card.   Self-Pay (no insurance) in [facility]:  Organization         Address  Phone   Notes  Sickle Cell Patients, [facility] Internal Medicine [address] [place], [place] [phone]   [facility] Urgent Care [address] [place], [place] [phone]   [facility] [place]  [address], [place] [phone]   [facility]/Dr. Osei-Bonsu  [address], [place] or [address], [place] [phone] Phone number for both [place] and [place] locations is the same.  [facility] [address], [place] [phone]   [facility] [address] [place] or [address] [phone]  [phone]   [facility] [address], [place] [phone], phone; [phone], fax Sees patients 1st and 3rd Saturday of every month.  Must not qualify for public or private insurance (i.e. Medicaid, Medicare, [place] Health Choice, Veterans' Benefits)  Household income should be no more than 200% of the poverty level The clinic cannot treat you if you are pregnant or think you are pregnant  Sexually transmitted diseases are not treated at the clinic.    Dental Care: Organization         Address  Phone  Notes  [facility] Department of [facility] [address] [place], [place] [phone] Accepts children up to age 42 who are enrolled in [place] or [place] Health Choice; pregnant women with a Medicaid card; and children who have applied for Medicaid or [place] Health Choice, but were declined, whose parents can pay a reduced fee at time of service.  [facility] Department of [facility]  [address], [place]  [phone] Accepts children up to age 52 who are enrolled in [place] or [place] Health Choice; pregnant women with a Medicaid card; and children who have applied for Medicaid or [place] Health Choice, but were declined, whose parents can pay a reduced fee at time of service.  [facility]  [address] [place], [place] [phone] Patients are seen by appointment only. Walk-ins are not accepted. [facility] will see patients 25 years of age and older. Monday - Tuesday (8am-5pm) Most Wednesdays (8:30-5pm) $30 per visit, cash only  [facility] Access [facility]  [address], [facility] [phone] Patients are seen by appointment only. Walk-ins are not accepted. [facility] will see patients 50 years of age and older. One Wednesday Evening (Monthly: Volunteer Based).  $30 per visit, cash only  [employer]  [phone] for adults; Children under age 54, call Graduate Pediatric Dentistry at [phone]. Children aged 44-14, please call [phone] to request a pediatric application.  Dental services are provided in all areas of dental care including fillings, crowns and bridges, complete and partial dentures, implants, gum treatment, root canals, and extractions. Preventive care is also provided. Treatment is provided to both adults and children. Patients are selected via a lottery and there is often a waiting list.   [facility] [address], [place]  [phone] [email or web address]   [facility] [address] [place], [place] [phone], Ext. [phone] Second and Fourth Thursday of each month, opens at 6:30 AM; Clinic ends at 9 AM.  Patients are seen on a first-come first-served basis, and a limited number are seen during each clinic.   [facility]  [address] Ether Griffins [place], [place] [phone]   Eligibility Requirements You must have lived in [place], [place], or [place]  counties for at least the last three months.   You cannot be eligible for state or federal sponsored [place], including [employer], [place], or [employer].   You generally cannot be eligible for healthcare insurance through your employer.    How to apply: Eligibility screenings are held every Tuesday and Wednesday afternoon from 1:00 pm until 4:00 pm. You do not need an appointment for the interview!  [facility] [address] [place], [place] [phone]   [facility]  [phone]   [facility]  [phone]  [facility]  [phone]    Behavioral Health Resources in the Community: Intensive Outpatient Programs Organization         Address  Phone  Notes  [facility] [address], [place], [place] [phone]   [facility] [address], [place], [place] [phone]   [facility] [address], [place], [place]  [phone]   [facility] [address],  [place], [place] [phone] or [phone]   Substance Abuse Resources Organization         Address  Phone  Notes  [facility]  [phone]   [facility]  [phone]   [facility]  [phone]   [facility]  [phone]   Residential & Outpatient Substance Abuse Program  [phone]   Psychological Services Organization         Address  Phone  Notes  [facility]  [phone]- [phone]   [facility]  [phone]- [phone]   [facility] [address], [place] [phone] or [phone]    Mobile Crisis Teams Organization         Address  Phone  Notes  [facility], Mobile Crisis Care Unit  [phone]   [facility]  [address] [place], [place] [phone]   Doristine LocksSharon DeEsch [address] [place]  [place] [phone]    Self-Help/Support Groups Organization         Address  Phone             Notes  [facility] [place] - variety of support groups  336- [phone] Call for more information  Narcotics Anonymous (NA), [facility] [address], [place]  2 meetings at this location   Statisticianesidential Treatment Programs Organization         Address  Phone  Notes  [facility] [address],    [place]  [phone]   [facility]  [address], [place] [phone], [place], [place] [phone]   [facility] [address], [place] [phone] Admissions: 8am-3pm M-F  [facility] [address]    [place], [place] [phone]   [facility] [address], [place], [place] [phone]   [facility] [address]  [place], [place] [phone]   [facility] - Intensive Outpatient [address], [place], [place] [phone]   [facility] ([facility] [address]  [place], [place] [phone] or [phone]   [facility] ([facility]) [address] [place], [place] [phone] Accepts Medicaid  [facility] [address]  [place] [phone] Substance Abuse/Addiction Treatment   [facility] Resources Organization         Address  Phone  Notes  [facility]  [phone](888) [phone]   Angie FavaJulie Brannon, PhD [address], [address] [place], [place]   [phone] or [phone]   [facility]   [address] [place], [place] [phone]   [facility] [address], [place], [place] [phone] Insurance/Medicaid/sponsorship through [facility]  [facility] [address]                                    [place], [place] [phone] Therapy/tele-psych/case  [facility] [address]   [place], [place] [phone]    Dr. Lolly Mustache  [phone]   [facility]  [employer] [facility] Dept. 1) [address] [place] 2) [address] [place] 3)  [address], [place] [phone]  [phone]   [facility] Child Abuse Hotline [phone])863-9459 or [phone] (After Hours)

## 2014-11-04 NOTE — ED Notes (Addendum)
Pt states "I have arthritis in my feet.  Today I fell.  I had numbness in my legs/feet.  When I fell I just hit my knee.  The pain is just my feet and lower legs.  I tried to get to UC today but it was too late."

## 2014-12-21 ENCOUNTER — Emergency Department (HOSPITAL_COMMUNITY)
Admission: EM | Admit: 2014-12-21 | Discharge: 2014-12-21 | Disposition: A | Discharge status: Home / Self Care | Payer: 59 | Source: Home / Self Care | Attending: Emergency Medicine | Admitting: Emergency Medicine

## 2014-12-21 ENCOUNTER — Encounter (HOSPITAL_COMMUNITY): Payer: Self-pay | Admitting: *Deleted

## 2014-12-21 DIAGNOSIS — R51 Headache: Secondary | ICD-10-CM | POA: Diagnosis not present

## 2014-12-21 DIAGNOSIS — R519 Headache, unspecified: Secondary | ICD-10-CM

## 2014-12-21 MED ORDER — METOCLOPRAMIDE HCL 10 MG PO TABS: 10.0000 mg | ORAL_TABLET | Freq: Four times a day (QID) | ORAL | Status: DC | PRN

## 2014-12-21 MED ORDER — KETOROLAC TROMETHAMINE 60 MG/2ML IM SOLN
60.0000 mg | Freq: Once | INTRAMUSCULAR | Status: AC
  Administered 2014-12-21: 60 mg via INTRAMUSCULAR

## 2014-12-21 MED ORDER — METOCLOPRAMIDE HCL 5 MG/ML IJ SOLN
10.0000 mg | Freq: Once | INTRAMUSCULAR | Status: AC
  Administered 2014-12-21: 10 mg via INTRAMUSCULAR

## 2014-12-21 MED ORDER — IBUPROFEN 600 MG PO TABS: 600.0000 mg | ORAL_TABLET | Freq: Four times a day (QID) | ORAL | Status: DC | PRN

## 2014-12-21 MED ORDER — KETOROLAC TROMETHAMINE 60 MG/2ML IM SOLN
INTRAMUSCULAR | Status: AC
  Filled 2014-12-21: qty 2

## 2014-12-21 MED ORDER — PREDNISONE 50 MG PO TABS: ORAL_TABLET | ORAL | Status: DC

## 2014-12-21 MED ORDER — METOCLOPRAMIDE HCL 5 MG/ML IJ SOLN
INTRAMUSCULAR | Status: AC
  Filled 2014-12-21: qty 2

## 2014-12-21 NOTE — ED Notes (Signed)
Pt  Reports  Symptoms  Of  Headache     X  5 days     Worse this  Am            Nausea    No vomiting        Symptoms  unreleived  By  otc  meds         Pt is  A  Diabetic      Who  States  She  Has  Not  taken her meds  In  Several  Months

## 2014-12-21 NOTE — ED Provider Notes (Signed)
CSN: [id number]     Arrival date & time 12/21/14  1259 History   First MD Initiated Contact with Patient 12/21/14 1318     Chief Complaint  Patient presents with  . Headache   (Consider location/radiation/quality/duration/timing/severity/associated sxs/prior Treatment) HPI  She is a 34 year old woman here for evaluation of headache. She states the headache started about a week ago. It has been fairly constant. It does not prevent her from sleeping. It is located in the bilateral temples. She describes it as a constant aching. No phonophobia or photophobia. She does report nausea, but no vomiting. She has tried over-the-counter medications without improvement. She also reports a little bit of numbness in her feet. She reports global weakness, but denies focal weakness. Her mother has migraine headaches. She has never had a migraine before.  She also reports feeling short of breath and coughing a lot, particularly at night. She does have a history of asthma/bronchitis. She uses her albuterol inhaler as needed.  She also states she does snore at night. She reports someone wanted to evaluate her for sleep apnea, but she did not have insurance at that time.  Past Medical History  Diagnosis Date  . Asthma   . Thyroid disease   . Hypertension   . Diabetes mellitus without complication [facility])    Past Surgical History  Procedure Laterality Date  . Extra digit removed     Family History  Problem Relation Age of Onset  . Hypothyroidism Mother   . Hypertension Other    Social History  Substance Use Topics  . Smoking status: Current Every Day Smoker -- 0.50 packs/day for 10 years    Types: Cigarettes  . Smokeless tobacco: Never Used  . Alcohol Use: Yes     Comment: Social drinker    OB History    Gravida Para Term Preterm AB TAB SAB Ectopic Multiple Living       Review of Systems As in history of present illness Allergies  Review of patient's allergies indicates no  known allergies.  Home Medications   Prior to Admission medications   Medication Sig Start Date End Date Taking? Authorizing Provider  albuterol (VENTOLIN HFA) 108 (90 BASE) MCG/ACT inhaler Inhale 2 puffs into the lungs every 6 (six) hours as needed for wheezing or shortness of breath.    Historical Provider, MD  beclomethasone (QVAR) 80 MCG/ACT inhaler Inhale 2 puffs into the lungs 2 (two) times daily. 07/27/14   Hayden Rasmussen, NP  hydrochlorothiazide (HYDRODIURIL) 25 MG tablet Take 25 mg by mouth daily.    Historical Provider, MD  ibuprofen (ADVIL,MOTRIN) 600 MG tablet Take 1 tablet (600 mg total) by mouth every 6 (six) hours as needed for headache. 12/21/14   Charm Rings, MD  levothyroxine (SYNTHROID, LEVOTHROID) 25 MCG tablet Take 25 mcg by mouth daily before breakfast.    Historical Provider, MD  lisinopril (PRINIVIL,ZESTRIL) 2.5 MG tablet Take 2.5 mg by mouth daily.    Historical Provider, MD  metoCLOPramide (REGLAN) 10 MG tablet Take 1 tablet (10 mg total) by mouth every 6 (six) hours as needed for nausea (headache). 12/21/14   Charm Rings, MD  naproxen (NAPROSYN) 500 MG tablet Take 1 tablet (500 mg total) by mouth 2 (two) times daily. 01/19/14   Harle Battiest, NP  predniSONE (DELTASONE) 50 MG tablet Take 1 pill daily for 5 days. 12/21/14   Charm Rings, MD   Meds Ordered and Administered  this Visit   Medications  ketorolac (TORADOL) injection 60 mg (not administered)  metoCLOPramide (REGLAN) injection 10 mg (not administered)    BP 165/78 mmHg  Pulse 73  Temp(Src) 98 F (36.7 C) (Oral)  Resp 16  SpO2 98%  LMP 10/21/2014 (Within Months) No data found.   Physical Exam  Constitutional: She is oriented to person, place, and time. She appears well-developed and well-nourished. No distress.  Eyes: EOM are normal. Pupils are equal, round, and reactive to light.  Neck: Neck supple.  Cardiovascular: Normal rate, regular rhythm and normal heart sounds.   No murmur  heard. Pulmonary/Chest: Effort normal and breath sounds normal. No respiratory distress. She has no wheezes. She has no rales.  Neurological: She is alert and oriented to person, place, and time. No cranial nerve deficit. She exhibits normal muscle tone. Coordination normal.    ED Course  Procedures (including critical care time)  Labs Review Labs Reviewed - No data to display  Imaging Review No results found.    MDM   1. Headache, unspecified headache type    With the nausea, headache is likely migrainous. Neurologic exam is normal. Will treat here with Toradol and Reglan. Uncontrolled asthma may be contributing as well as she reports frequent nighttime cough. She may also have sleep apnea which can contribute to headaches as well. Now that she has insurance, she will work on finding a primary care doctor. I let her know that [facility] offices [facility]. She just needs to call and find out who is accepting new patients. Prescriptions given for prednisone (for asthma), ibuprofen, and Reglan.    Charm RingsErin J Kinzlee Selvy, MD 12/21/14 1341

## 2014-12-21 NOTE — Discharge Instructions (Signed)
Your headache is likely a migraine headache. It may have been triggered by your coughing or potentially sleep apnea. We gave you some medicines here to stop the headache. Please go home and try to take a nap. A cold rag on your head may help. Take prednisone daily for 5 days. This will help with your cough as well as help to prevent the headache from coming back. I've also given new prescriptions for ibuprofen and Reglan. If you feel this headache starting again, take 600 mg of ibuprofen and 10 mg of Reglan. Please work on finding a primary care doctor. You just need to call some offices to see who is accepting new patients. The [facility] insurance is accepted at [facility] primary care locations as well as [facility] primary care locations.

## 2015-07-06 DIAGNOSIS — Z111 Encounter for screening for respiratory tuberculosis: Secondary | ICD-10-CM | POA: Diagnosis not present

## 2015-08-05 ENCOUNTER — Emergency Department (HOSPITAL_COMMUNITY)
Admission: EM | Admit: 2015-08-05 | Discharge: 2015-08-05 | Disposition: A | Discharge status: Home / Self Care | Payer: 59 | Attending: Emergency Medicine | Admitting: Emergency Medicine

## 2015-08-05 ENCOUNTER — Emergency Department (HOSPITAL_COMMUNITY): Payer: 59

## 2015-08-05 ENCOUNTER — Encounter (HOSPITAL_COMMUNITY): Payer: Self-pay | Admitting: *Deleted

## 2015-08-05 DIAGNOSIS — Z79899 Other long term (current) drug therapy: Secondary | ICD-10-CM | POA: Diagnosis not present

## 2015-08-05 DIAGNOSIS — Y929 Unspecified place or not applicable: Secondary | ICD-10-CM | POA: Insufficient documentation

## 2015-08-05 DIAGNOSIS — F17210 Nicotine dependence, cigarettes, uncomplicated: Secondary | ICD-10-CM | POA: Diagnosis not present

## 2015-08-05 DIAGNOSIS — Y939 Activity, unspecified: Secondary | ICD-10-CM | POA: Diagnosis not present

## 2015-08-05 DIAGNOSIS — I10 Essential (primary) hypertension: Secondary | ICD-10-CM | POA: Insufficient documentation

## 2015-08-05 DIAGNOSIS — X58XXXA Exposure to other specified factors, initial encounter: Secondary | ICD-10-CM | POA: Insufficient documentation

## 2015-08-05 DIAGNOSIS — S4992XA Unspecified injury of left shoulder and upper arm, initial encounter: Secondary | ICD-10-CM | POA: Diagnosis present

## 2015-08-05 DIAGNOSIS — S46912A Strain of unspecified muscle, fascia and tendon at shoulder and upper arm level, left arm, initial encounter: Secondary | ICD-10-CM

## 2015-08-05 DIAGNOSIS — Y999 Unspecified external cause status: Secondary | ICD-10-CM | POA: Insufficient documentation

## 2015-08-05 DIAGNOSIS — S46812A Strain of other muscles, fascia and tendons at shoulder and upper arm level, left arm, initial encounter: Secondary | ICD-10-CM | POA: Diagnosis not present

## 2015-08-05 DIAGNOSIS — J45909 Unspecified asthma, uncomplicated: Secondary | ICD-10-CM | POA: Diagnosis not present

## 2015-08-05 DIAGNOSIS — R918 Other nonspecific abnormal finding of lung field: Secondary | ICD-10-CM | POA: Diagnosis not present

## 2015-08-05 DIAGNOSIS — M542 Cervicalgia: Secondary | ICD-10-CM | POA: Diagnosis not present

## 2015-08-05 DIAGNOSIS — E119 Type 2 diabetes mellitus without complications: Secondary | ICD-10-CM | POA: Insufficient documentation

## 2015-08-05 DIAGNOSIS — Z791 Long term (current) use of non-steroidal anti-inflammatories (NSAID): Secondary | ICD-10-CM | POA: Insufficient documentation

## 2015-08-05 LAB — BASIC METABOLIC PANEL
Anion gap: 9 (ref 5–15)
BUN: 11 mg/dL (ref 6–20)
CO2: 25 mmol/L (ref 22–32)
Calcium: 9 mg/dL (ref 8.9–10.3)
Chloride: 104 mmol/L (ref 101–111)
Creatinine, Ser: 0.8 mg/dL (ref 0.44–1.00)
GFR calc Af Amer: >60 mL/min (ref >60)
GFR calc non Af Amer: >60 mL/min (ref >60)
Glucose, Bld: 84 mg/dL (ref 65–99)
Potassium: 3.4 mmol/L — ABNORMAL LOW (ref 3.5–5.1)
Sodium: 138 mmol/L (ref 135–145)

## 2015-08-05 MED ORDER — NAPROXEN 250 MG PO TABS
500.0000 mg | ORAL_TABLET | Freq: Once | ORAL | Status: AC
  Administered 2015-08-05: 500 mg via ORAL
  Filled 2015-08-05: qty 2

## 2015-08-05 MED ORDER — CYCLOBENZAPRINE HCL 10 MG PO TABS: 10.0000 mg | ORAL_TABLET | Freq: Two times a day (BID) | ORAL | Status: DC | PRN

## 2015-08-05 MED ORDER — NAPROXEN 500 MG PO TABS: 500.0000 mg | ORAL_TABLET | Freq: Two times a day (BID) | ORAL | Status: DC

## 2015-08-05 MED ORDER — CYCLOBENZAPRINE HCL 10 MG PO TABS
10.0000 mg | ORAL_TABLET | Freq: Once | ORAL | Status: AC
  Administered 2015-08-05: 10 mg via ORAL
  Filled 2015-08-05: qty 1

## 2015-08-05 NOTE — Discharge Instructions (Signed)
Muscle Strain A muscle strain is an injury that occurs when a muscle is stretched beyond its normal length. Usually a small number of muscle fibers are torn when this happens. Muscle strain is rated in degrees. First-degree strains have the least amount of muscle fiber tearing and pain. Second-degree and third-degree strains have increasingly more tearing and pain.  Usually, recovery from muscle strain takes 1-2 weeks. Complete healing takes 5-6 weeks.  CAUSES  Muscle strain happens when a sudden, violent force placed on a muscle stretches it too far. This may occur with lifting, sports, or a fall.  RISK FACTORS Muscle strain is especially common in athletes.  SIGNS AND SYMPTOMS At the site of the muscle strain, there may be:  Pain.  Bruising.  Swelling.  Difficulty using the muscle due to pain or lack of normal function. DIAGNOSIS  Your health care provider will perform a physical exam and ask about your medical history. TREATMENT  Often, the best treatment for a muscle strain is resting, icing, and applying cold compresses to the injured area.  HOME CARE INSTRUCTIONS   Use the PRICE method of treatment to promote muscle healing during the first 2-3 days after your injury. The PRICE method involves:  Protecting the muscle from being injured again.  Restricting your activity and resting the injured body part.  Icing your injury. To do this, put ice in a plastic bag. Place a towel between your skin and the bag. Then, apply the ice and leave it on from 15-20 minutes each hour. After the third day, switch to moist heat packs.  Apply compression to the injured area with a splint or elastic bandage. Be careful not to wrap it too tightly. This may interfere with blood circulation or increase swelling.  Elevate the injured body part above the level of your heart as often as you can.  Only take over-the-counter or prescription medicines for pain, discomfort, or fever as directed by your  health care provider.  Warming up prior to exercise helps to prevent future muscle strains. SEEK MEDICAL CARE IF:   You have increasing pain or swelling in the injured area.  You have numbness, tingling, or a significant loss of strength in the injured area. MAKE SURE YOU:   Understand these instructions.  Will watch your condition.  Will get help right away if you are not doing well or get worse.   This information is not intended to replace advice given to you by your health care provider. Make sure you discuss any questions you have with your health care provider.   Document Released: 01/01/2005 Document Revised: 10/22/2012 Document Reviewed: 07/31/2012 Elsevier Interactive Patient Education [employer]2016 Elsevier Inc.   Use the medications as prescribed.  Use caution with Flexeril as this may make you drowsy.  I suggest applying heat to your neck and shoulder area for 15-20 minutes 3 times daily for the next week.  Please get rechecked if her symptoms are not improved with this therapy.  Your labs and x-rays are stable today.

## 2015-08-05 NOTE — ED Provider Notes (Signed)
CSN: [id number]     Arrival date & time 08/05/15  1955 History   First MD Initiated Contact with Patient 08/05/15 2047     Chief Complaint  Patient presents with  . Shoulder Pain     (Consider location/radiation/quality/duration/timing/severity/associated sxs/prior Treatment) The history is provided by the patient.   [name] is a 35 y.o. female presenting with a one week history of gradually worsening left neck and upper shoulder pain which has been present for the past week.  She describes her pain is sometimes worse with certain movements of her neck, but is consistently triggered by deep inspiration. She reports she sometimes has shortness of breath but is not consistent. She denies wheezing, fevers, chills, cough, palpitations and chest pain. She also denies headache, neck trauma, nausea, vomiting, diaphoresis.  She has had no radiation of pain and denies weakness or numbness in her extremities.   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 She has found no alleviators. Tried ibu without relief.  Patient past medical history is significant for untreated asthma, hypothyroidism, htn and diet controlled DM.  She does not currently have a pcp.   Past Medical History  Diagnosis Date  . Asthma   . Thyroid disease   . Hypertension   . Diabetes mellitus without complication [facility](HCC)    Past Surgical History  Procedure Laterality Date  . Extra digit removed     Family History  Problem Relation Age of Onset  . Hypothyroidism Mother   . Hypertension Other    Social History  Substance Use Topics  . Smoking status: Current Every Day Smoker -- 0.50 packs/day for 10 years    Types: Cigarettes  . Smokeless tobacco: Never Used  . Alcohol Use: Yes     Comment: Social drinker    OB History    Gravida Para Term Preterm AB TAB SAB Ectopic Multiple Living   0 0 0 0 0 0 0 0 0 0      Review of Systems   Constitutional: Negative for fever and chills.  HENT: Negative.  Negative for sore throat.   Eyes: Negative.   Respiratory: Positive for shortness of breath. Negative for chest tightness.   Cardiovascular: Negative for chest pain and palpitations.  Gastrointestinal: Negative for nausea, vomiting and abdominal pain.  Genitourinary: Negative.   Musculoskeletal: Positive for arthralgias and neck pain. Negative for joint swelling.  Skin: Negative.  Negative for rash and wound.  Neurological: Negative for dizziness, weakness, light-headedness, numbness and headaches.  Psychiatric/Behavioral: Negative.       Allergies  Review of patient's allergies indicates no known allergies.  Home Medications   Prior to Admission medications   Medication Sig Start Date End Date Taking? Authorizing Provider  albuterol (VENTOLIN HFA) 108 (90 BASE) MCG/ACT inhaler Inhale 2 puffs into the lungs every 6 (six) hours as needed for wheezing or shortness of breath.   Yes Historical Provider, MD  beclomethasone (QVAR) 80 MCG/ACT inhaler Inhale 2 puffs into the lungs 2 (two) times daily. 07/27/14  Yes Hayden Rasmussenavid Mabe, NP  hydrochlorothiazide (HYDRODIURIL) 25 MG tablet Take 25 mg by mouth daily.   Yes Historical Provider, MD  ibuprofen (ADVIL,MOTRIN) 600 MG tablet Take 1 tablet (600 mg total) by mouth every 6 (six) hours as needed for headache. 12/21/14  Yes Charm RingsErin J Honig, MD  levothyroxine (SYNTHROID, LEVOTHROID) 25 MCG tablet Take 25 mcg by mouth daily before breakfast.  Yes Historical Provider, MD  lisinopril (PRINIVIL,ZESTRIL) 2.5 MG tablet Take 2.5 mg by mouth daily.   Yes Historical Provider, MD  metoCLOPramide (REGLAN) 10 MG tablet Take 1 tablet (10 mg total) by mouth every 6 (six) hours as needed for nausea (headache). 12/21/14  Yes Charm Rings, MD  predniSONE (DELTASONE) 50 MG tablet Take 1 pill daily for 5 days. 12/21/14  Yes Charm Rings, MD  cyclobenzaprine (FLEXERIL) 10 MG tablet Take 1 tablet (10 mg total) by  mouth 2 (two) times daily as needed for muscle spasms. 08/05/15   Burgess Amor, PA-C  naproxen (NAPROSYN) 500 MG tablet Take 1 tablet (500 mg total) by mouth 2 (two) times daily. 08/05/15   Burgess Amor, PA-C   BP 138/73 mmHg  Pulse 72  Temp(Src) 98.9 F (37.2 C) (Oral)  Resp 20  Ht 5' 3.5" (1.613 m)  Wt 124.059 kg  BMI 47.68 kg/m2  SpO2 100%  LMP 07/26/2015 Physical Exam  Constitutional: She appears well-developed and well-nourished.  HENT:  Head: Normocephalic and atraumatic.  Eyes: Conjunctivae are normal.  Neck: Trachea normal, normal range of motion and full passive range of motion without pain. No JVD present. No spinous process tenderness and no muscular tenderness present. Carotid bruit is not present. No edema, no erythema and normal range of motion present. No thyroid mass present.  Cardiovascular: Normal rate, regular rhythm, normal heart sounds and intact distal pulses.   Pulmonary/Chest: Effort normal and breath sounds normal. No respiratory distress. She has no decreased breath sounds. She has no wheezes. She has no rhonchi. She has no rales. She exhibits no tenderness.  Abdominal: Soft. Bowel sounds are normal. There is no tenderness.  Musculoskeletal: Normal range of motion.  Neurological: She is alert.  Skin: Skin is warm and dry.  Psychiatric: She has a normal mood and affect.  Nursing note and vitals reviewed.   ED Course  Procedures (including critical care time) Labs Review Labs Reviewed  BASIC METABOLIC PANEL - Abnormal; Notable for the following:    Potassium 3.4 (*)    All other components within normal limits    Imaging Review Dg Chest 2 View  08/05/2015  CLINICAL DATA:  Pleuritic shoulder and neck pain. EXAM: CHEST  2 VIEW COMPARISON:  07/27/2014 FINDINGS: Stable linear opacities at the right base attributed to scar. There is no edema, consolidation, effusion, or pneumothorax. Normal heart size and negative mediastinal contours. IMPRESSION: 1. No acute  finding. 2. Mild right basilar scar Electronically Signed   By: Marnee Spring M.D.   On: 08/05/2015 22:37   Dg Cervical Spine Complete  08/05/2015  CLINICAL DATA:  Pleuritic shoulder and neck pain. EXAM: CERVICAL SPINE - COMPLETE 4+ VIEW COMPARISON:  None. FINDINGS: Vertebral alignment is within normal limits. Vertebral body heights are preserved. No significant degenerative changes are identified. No fracture is seen. Prevertebral soft tissues are within normal limits. Visualized lung apices are clear. IMPRESSION: Negative cervical spine radiographs. Electronically Signed   By: Sebastian Ache M.D.   On: 08/05/2015 22:38   I have personally reviewed and evaluated these images and lab results as part of my medical decision-making.   EKG Interpretation   Date/Time:  Friday August 05 2015 20:22:42 EDT Ventricular Rate:  73 PR Interval:  172 QRS Duration: 74 QT Interval:  374 QTC Calculation: 412 R Axis:   87 Text Interpretation:  Normal sinus rhythm Nonspecific T wave abnormality  When compared with ECG of 01/21/2014 QT has shortened and Diffuse  Nonspecific ST and T wave abnormality has improved Confirmed by [facility]   MD, Nicholos Johns [phone]) on 08/05/2015 11:12:23 PM       ED ECG REPORT   Date: 08/05/2015  Rate: 73  Rhythm: normal sinus rhythm  QRS Axis: normal  Intervals: normal  ST/T Wave abnormalities: nonspecific T wave changes  Conduction Disutrbances:none  Narrative Interpretation:  Prior ekg with QT prolongation, normal today  Old EKG Reviewed: changes noted  I have personally reviewed the EKG tracing and agree with the computerized printout as noted.   MDM   Final diagnoses:  Muscle strain of shoulder region, left, initial encounter    Pt low risk for PE given Wells criteria, PERC negative.  Suspect musculoskeletal source of sx, although not reproducible with palpation, when asked to abduct left arm,  She did have fleeting pain across left trapezius.  Patients labs  reviewed.  Radiological studies were viewed, interpreted and considered during the medical decision making and disposition process. I agree with radiologists reading.  Results were also discussed with patient. Advised heat tx, prescribed naproxen and flexeril. Referrals given for assistance locating pcp.  The patient appears reasonably screened and/or stabilized for discharge and I doubt any other medical condition or other [facility] requiring further screening, evaluation, or treatment in the ED at this time prior to discharge.      Burgess Amor, PA-C 08/06/15 1316  Samuel Jester, DO 08/06/15 2201

## 2015-08-05 NOTE — ED Notes (Addendum)
Pt reports left shoulder pain and it hurts in her neck and shoulder to take a deep breath. Pt has had these symptoms for over a week. Pt denies any known injury.

## 2015-09-12 DIAGNOSIS — R7303 Prediabetes: Secondary | ICD-10-CM | POA: Diagnosis not present

## 2015-09-12 DIAGNOSIS — R7309 Other abnormal glucose: Secondary | ICD-10-CM | POA: Diagnosis not present

## 2015-09-12 DIAGNOSIS — J4520 Mild intermittent asthma, uncomplicated: Secondary | ICD-10-CM | POA: Diagnosis not present

## 2015-09-12 DIAGNOSIS — Z3009 Encounter for other general counseling and advice on contraception: Secondary | ICD-10-CM | POA: Diagnosis not present

## 2015-09-12 DIAGNOSIS — R0609 Other forms of dyspnea: Secondary | ICD-10-CM | POA: Diagnosis not present

## 2015-09-12 DIAGNOSIS — R531 Weakness: Secondary | ICD-10-CM | POA: Diagnosis not present

## 2015-09-12 DIAGNOSIS — G4719 Other hypersomnia: Secondary | ICD-10-CM | POA: Diagnosis not present

## 2015-09-12 DIAGNOSIS — G132 Systemic atrophy primarily affecting the central nervous system in myxedema: Secondary | ICD-10-CM | POA: Diagnosis not present

## 2015-09-12 DIAGNOSIS — I10 Essential (primary) hypertension: Secondary | ICD-10-CM | POA: Diagnosis not present

## 2015-09-12 DIAGNOSIS — G4700 Insomnia, unspecified: Secondary | ICD-10-CM | POA: Diagnosis not present

## 2015-09-12 DIAGNOSIS — R5383 Other fatigue: Secondary | ICD-10-CM | POA: Diagnosis not present

## 2015-10-27 IMAGING — CR DG FOOT COMPLETE 3+V*L*
3 series · 3 of 3 positions shown · non-contrast
Comparison: None.

CLINICAL DATA: Left foot pain

EXAM:
LEFT FOOT - COMPLETE 3+ VIEW

[view not recorded (1 of 3)]
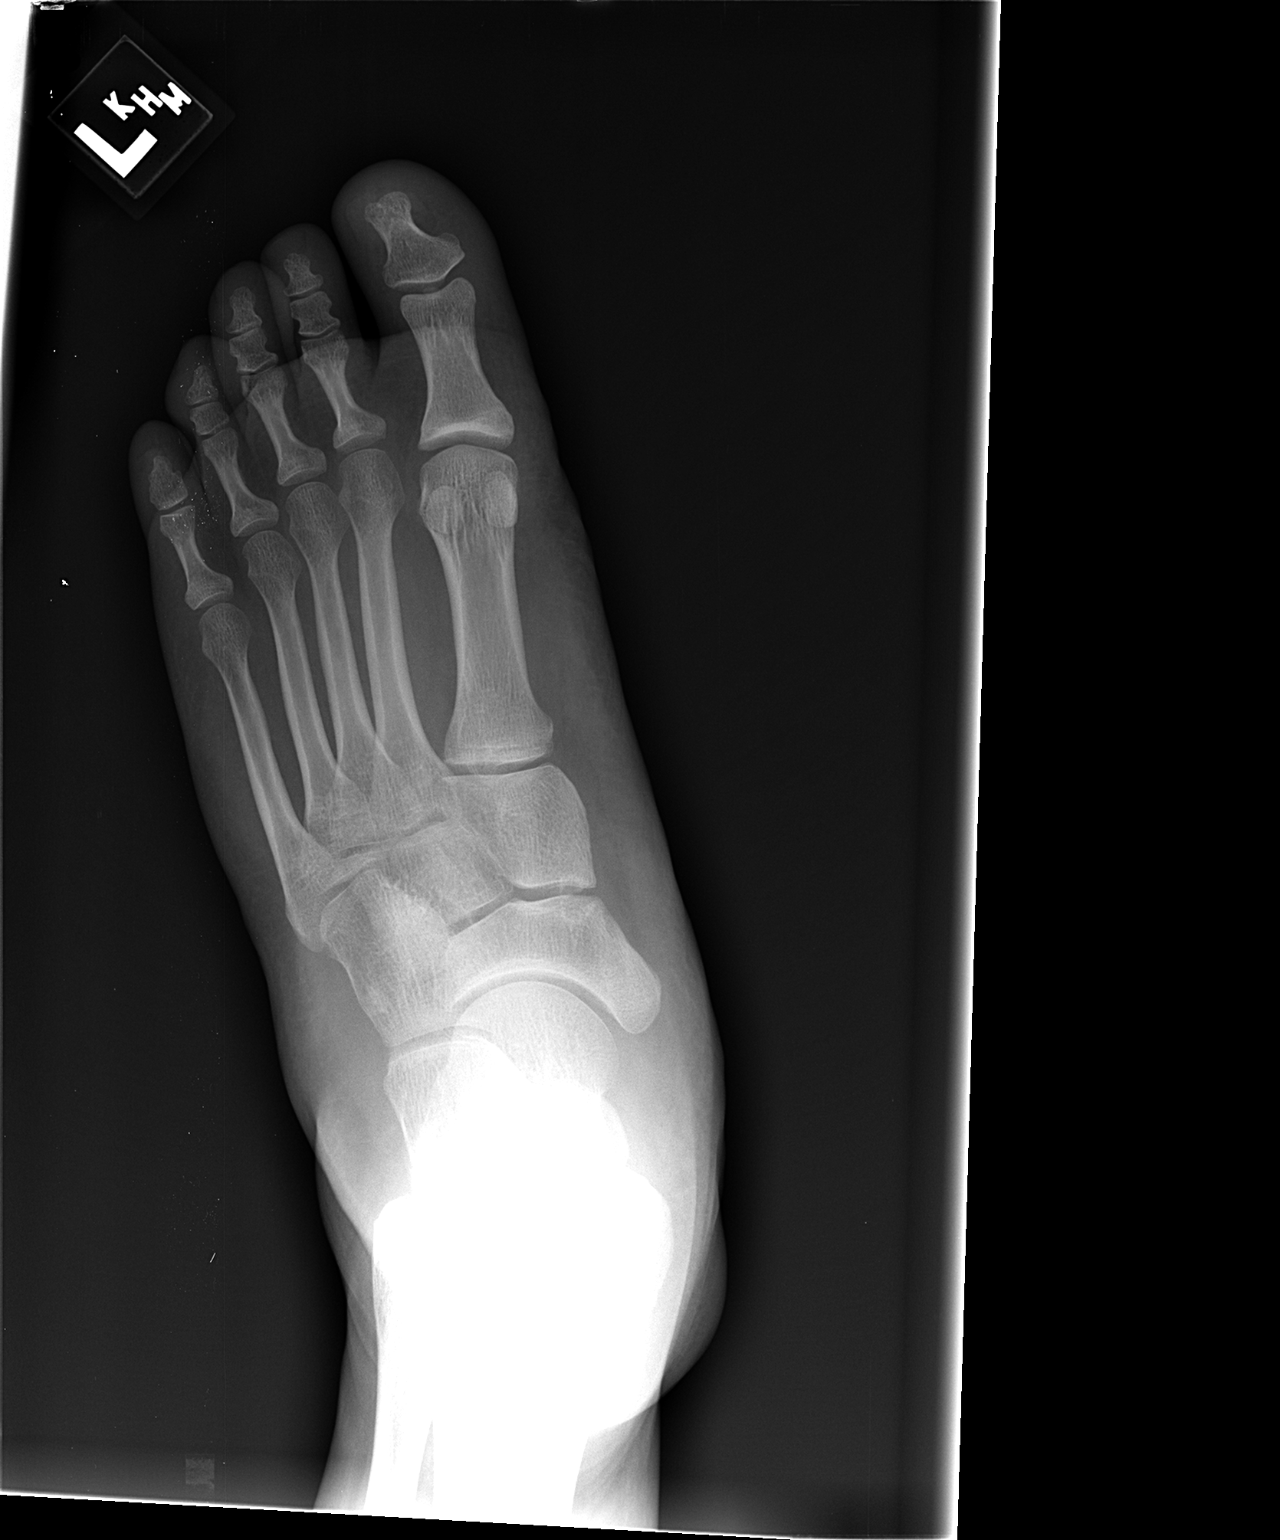

[view not recorded (2 of 3)]
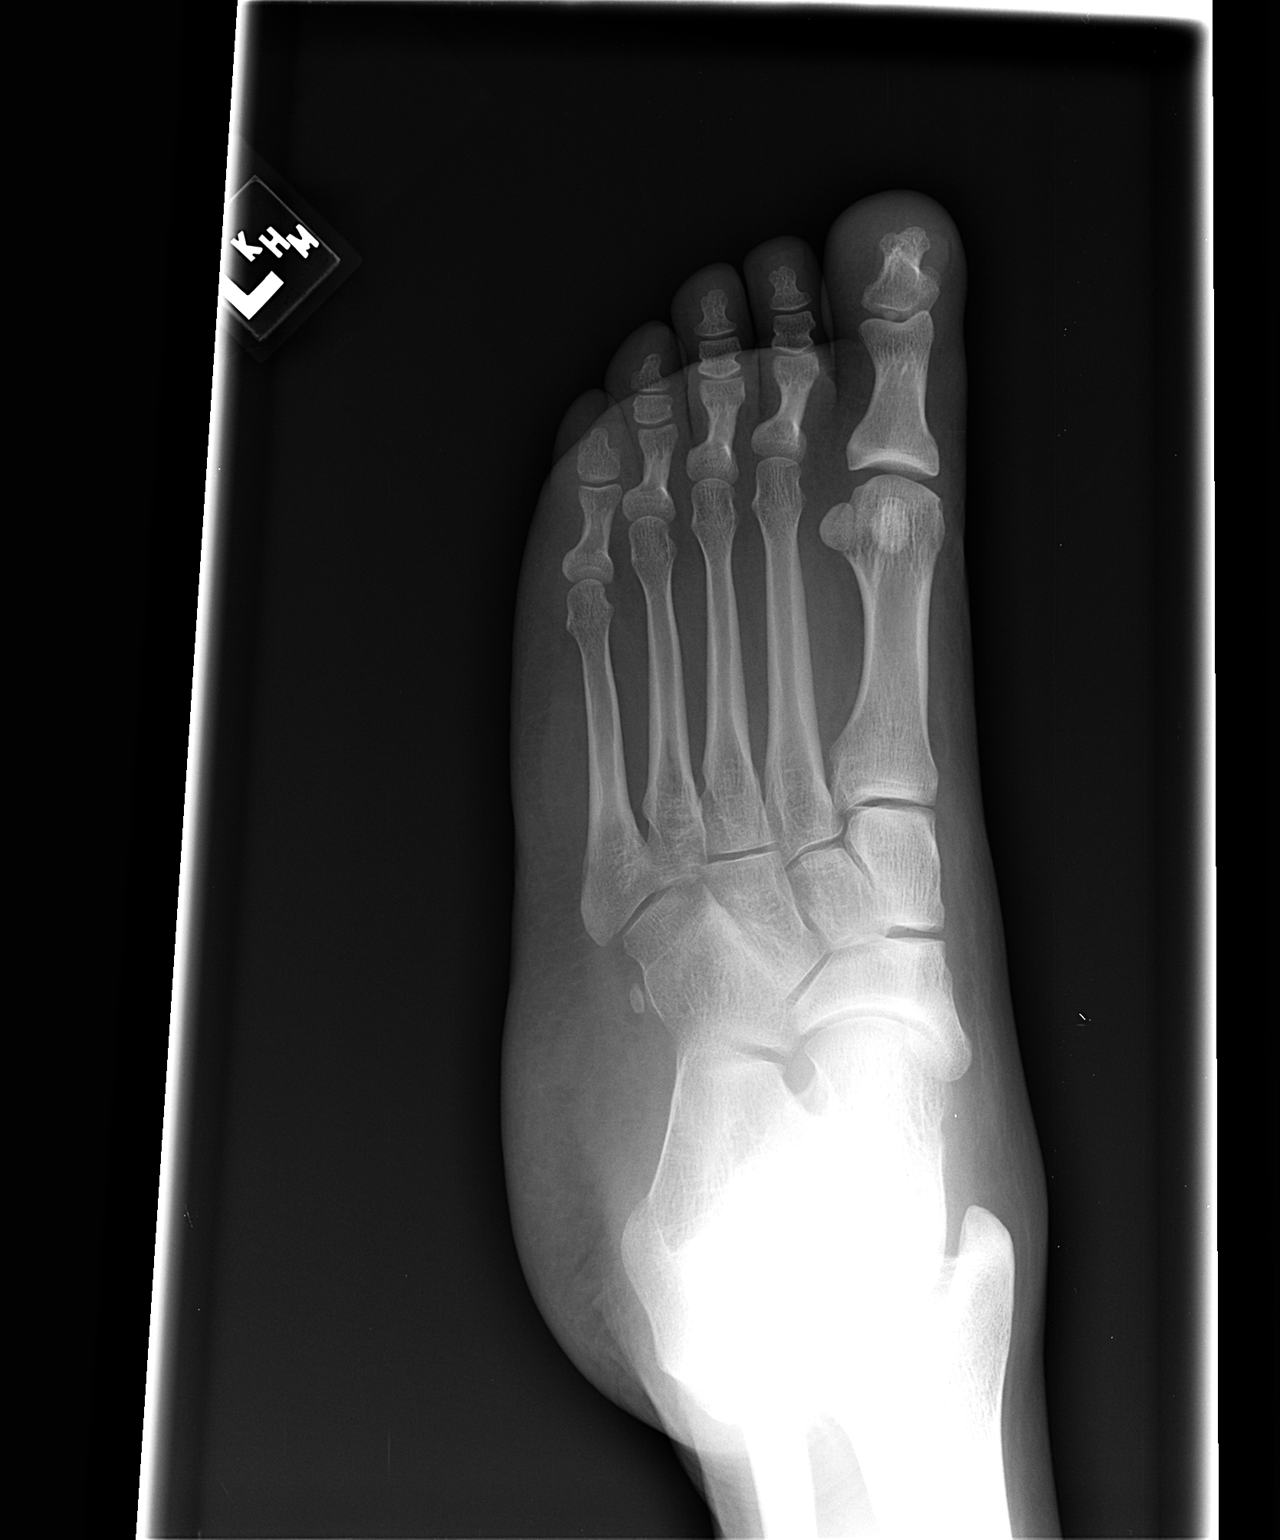

[view not recorded (3 of 3)]
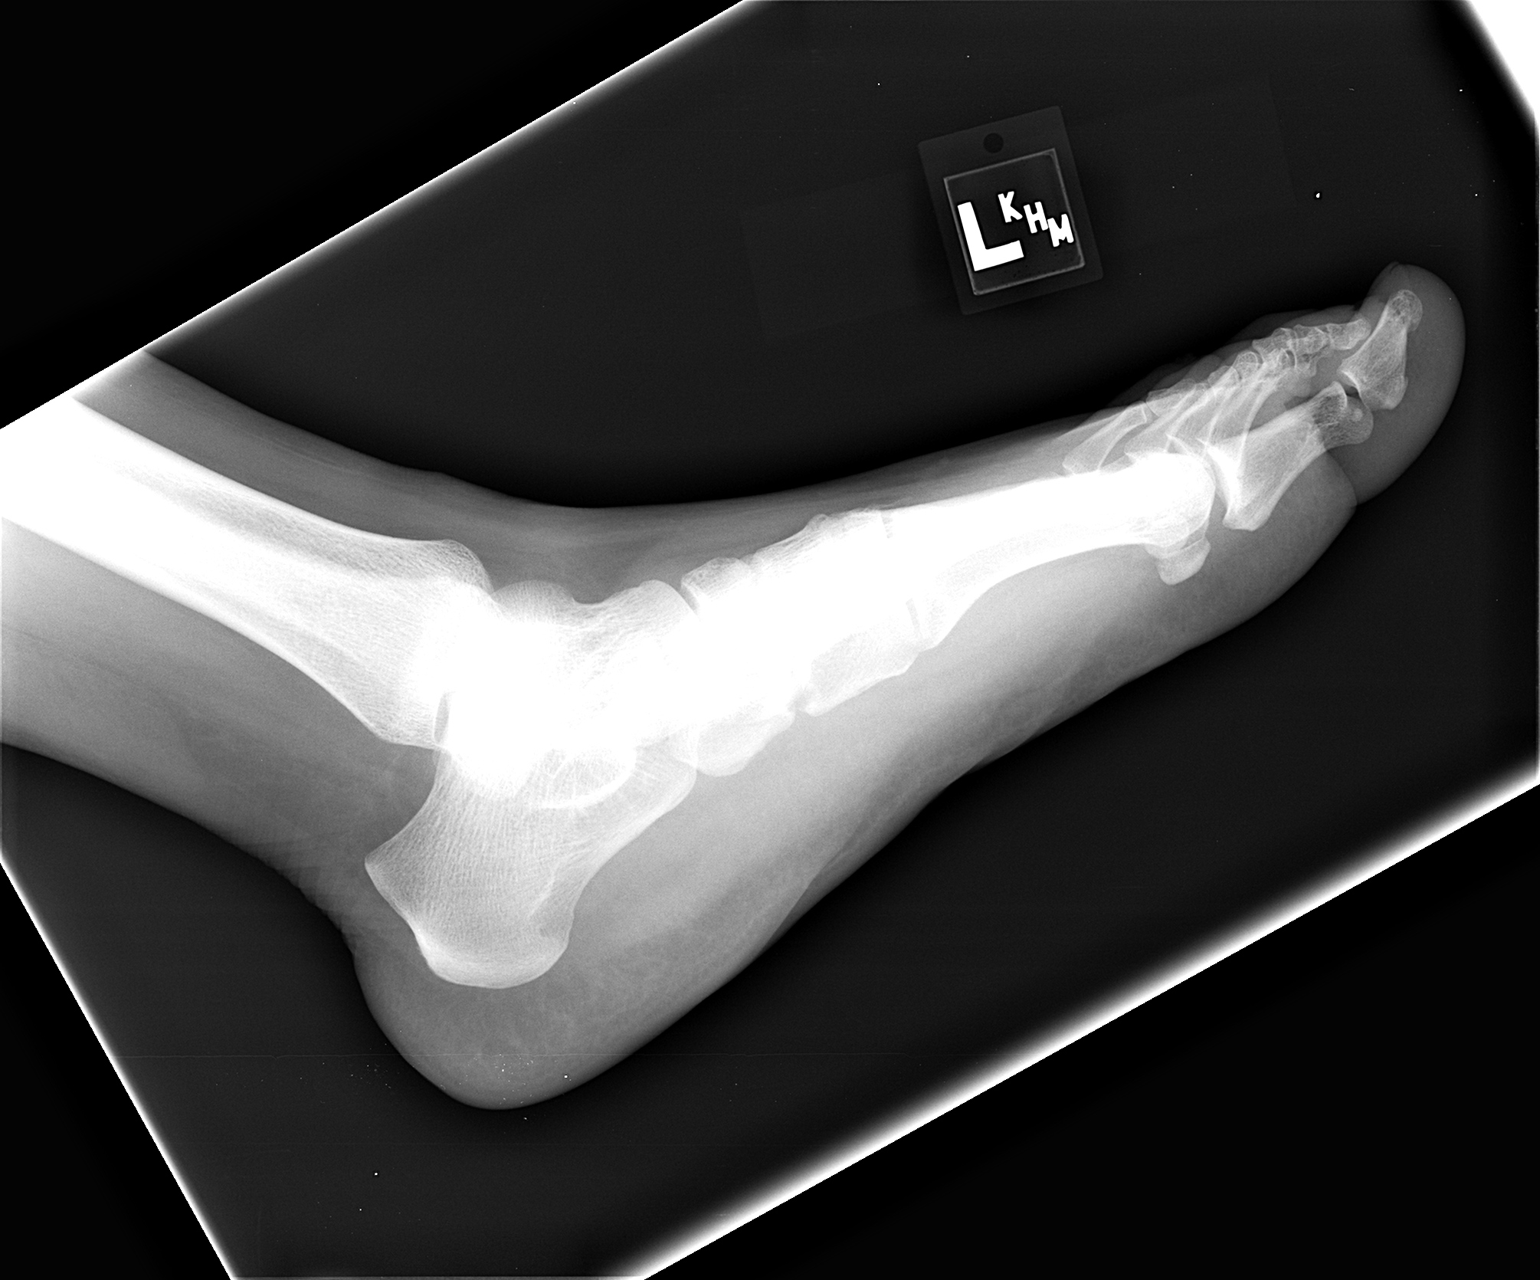

[3 of 3 positions shown; findings below may reference images not displayed]

FINDINGS: There is no evidence of fracture or dislocation. There is no
evidence of arthropathy or other focal bone abnormality. Soft
tissues are unremarkable. The plantar arch is somewhat flattened.
IMPRESSION: No acute abnormality noted.

## 2015-12-19 DIAGNOSIS — E559 Vitamin D deficiency, unspecified: Secondary | ICD-10-CM | POA: Diagnosis not present

## 2015-12-19 DIAGNOSIS — R531 Weakness: Secondary | ICD-10-CM | POA: Diagnosis not present

## 2015-12-19 DIAGNOSIS — Z23 Encounter for immunization: Secondary | ICD-10-CM | POA: Diagnosis not present

## 2015-12-19 DIAGNOSIS — R5383 Other fatigue: Secondary | ICD-10-CM | POA: Diagnosis not present

## 2015-12-19 DIAGNOSIS — I10 Essential (primary) hypertension: Secondary | ICD-10-CM | POA: Diagnosis not present

## 2015-12-19 DIAGNOSIS — E139 Other specified diabetes mellitus without complications: Secondary | ICD-10-CM | POA: Diagnosis not present

## 2015-12-19 DIAGNOSIS — E119 Type 2 diabetes mellitus without complications: Secondary | ICD-10-CM | POA: Diagnosis not present

## 2015-12-19 DIAGNOSIS — M791 Myalgia: Secondary | ICD-10-CM | POA: Diagnosis not present

## 2016-04-18 DIAGNOSIS — R531 Weakness: Secondary | ICD-10-CM | POA: Diagnosis not present

## 2016-04-18 DIAGNOSIS — R7309 Other abnormal glucose: Secondary | ICD-10-CM | POA: Diagnosis not present

## 2016-04-18 DIAGNOSIS — E039 Hypothyroidism, unspecified: Secondary | ICD-10-CM | POA: Diagnosis not present

## 2016-04-18 DIAGNOSIS — G4719 Other hypersomnia: Secondary | ICD-10-CM | POA: Diagnosis not present

## 2016-04-18 DIAGNOSIS — R7303 Prediabetes: Secondary | ICD-10-CM | POA: Diagnosis not present

## 2016-04-18 DIAGNOSIS — R0609 Other forms of dyspnea: Secondary | ICD-10-CM | POA: Diagnosis not present

## 2016-04-18 DIAGNOSIS — E559 Vitamin D deficiency, unspecified: Secondary | ICD-10-CM | POA: Diagnosis not present

## 2016-04-18 DIAGNOSIS — I10 Essential (primary) hypertension: Secondary | ICD-10-CM | POA: Diagnosis not present

## 2016-05-17 MED FILL — AMOXICILLIN 500 MG CAPSULE: 500 | 7 days supply | Qty: 28 | Fill #0

## 2016-05-23 ENCOUNTER — Ambulatory Visit (INDEPENDENT_AMBULATORY_CARE_PROVIDER_SITE_OTHER): Payer: 59 | Admitting: Orthopaedic Surgery

## 2016-05-23 ENCOUNTER — Encounter: Payer: Self-pay | Admitting: Orthopaedic Surgery

## 2016-05-23 ENCOUNTER — Ambulatory Visit (INDEPENDENT_AMBULATORY_CARE_PROVIDER_SITE_OTHER): Payer: 59

## 2016-05-23 VITALS — BP 142/93 | HR 78 | Temp 97.7°F | Ht 64.0 in | Wt 272.0 lb

## 2016-05-23 DIAGNOSIS — F17210 Nicotine dependence, cigarettes, uncomplicated: Secondary | ICD-10-CM

## 2016-05-23 DIAGNOSIS — M25561 Pain in right knee: Secondary | ICD-10-CM

## 2016-05-23 DIAGNOSIS — G8929 Other chronic pain: Secondary | ICD-10-CM

## 2016-05-23 NOTE — Progress Notes (Signed)
Subjective:    Patient ID: [name], female    DOB: [birth year], 36 y.o.   MRN: [id number]  HPI She has been having pain of the right knee over six months getting progressively worse.  She has swelling and popping and more recently giving way of the right knee.  She has no trauma, no redness, no other joint pains.  She has tried Advil, Tylenol, ice, heat, rest and rubs with no help.  She has no distal swelling.  She has been seen by [facility] and I have their notes and have reviewed them.  She is a current smoke and is willing to cut back.  Review of Systems  HENT: Negative for congestion.   Respiratory: Positive for shortness of breath. Negative for cough.   Cardiovascular: Negative for chest pain and leg swelling.  Endocrine: Positive for cold intolerance.  Musculoskeletal: Positive for arthralgias, gait problem and joint swelling.  Allergic/Immunologic: Positive for environmental allergies.   Past Medical History:  Diagnosis Date  . Arthritis   . Asthma   . Diabetes mellitus without complication (HCC)   . Hypertension   . Osteoporosis   . Thyroid disease     Past Surgical History:  Procedure Laterality Date  . extra digit removed      Current Outpatient Prescriptions on File Prior to Visit  Medication Sig Dispense Refill  . albuterol (VENTOLIN HFA) 108 (90 BASE) MCG/ACT inhaler Inhale 2 puffs into the lungs every 6 (six) hours as needed for wheezing or shortness of breath.    . beclomethasone (QVAR) 80 MCG/ACT inhaler Inhale 2 puffs into the lungs 2 (two) times daily. 1 Inhaler 0  . cyclobenzaprine (FLEXERIL) 10 MG tablet Take 1 tablet (10 mg total) by mouth 2 (two) times daily as needed for muscle spasms. (Patient not taking: Reported on 05/23/2016) 20 tablet 0  . hydrochlorothiazide (HYDRODIURIL) 25 MG tablet Take 25 mg by mouth daily.    Marland Kitchen ibuprofen (ADVIL,MOTRIN) 600 MG tablet Take 1 tablet (600 mg total) by mouth every 6 (six) hours as needed  for headache. (Patient not taking: Reported on 05/23/2016) 30 tablet 0  . levothyroxine (SYNTHROID, LEVOTHROID) 25 MCG tablet Take 25 mcg by mouth daily before breakfast.    . lisinopril (PRINIVIL,ZESTRIL) 2.5 MG tablet Take 2.5 mg by mouth daily.    . metoCLOPramide (REGLAN) 10 MG tablet Take 1 tablet (10 mg total) by mouth every 6 (six) hours as needed for nausea (headache). (Patient not taking: Reported on 05/23/2016) 30 tablet 0  . naproxen (NAPROSYN) 500 MG tablet Take 1 tablet (500 mg total) by mouth 2 (two) times daily. (Patient not taking: Reported on 05/23/2016) 30 tablet 0  . predniSONE (DELTASONE) 50 MG tablet Take 1 pill daily for 5 days. (Patient not taking: Reported on 05/23/2016) 5 tablet 0  . [DISCONTINUED] promethazine (PHENERGAN) 25 MG tablet Take 1 tablet (25 mg total) by mouth every 6 (six) hours as needed for nausea or vomiting. (Patient not taking: Reported on 12/27/2013) 12 tablet 0   No current facility-administered medications on file prior to visit.     Social History   Social History  . Marital status: Single    Spouse name: N/A  . Number of children: N/A  . Years of education: N/A   Occupational History  . Not on file.   Social History Main Topics  . Smoking status: Current Every Day Smoker    Packs/day: 0.50    Years: 10.00  Types: Cigarettes  . Smokeless tobacco: Never Used  . Alcohol use Yes     Comment: Social drinker   . Drug use: No  . Sexual activity: No   Other Topics Concern  . Not on file   Social History Narrative  . No narrative on file    Family History  Problem Relation Age of Onset  . Hypothyroidism Mother   . Hypertension Other     BP (!) 142/93   Pulse 78   Temp 97.7 F (36.5 C)   Ht 5\' 4"  (1.626 m)   Wt 272 lb (123.4 kg)   LMP 04/23/2016 (Approximate)   BMI 46.69 kg/m      Objective:   Physical Exam  Constitutional: She is oriented to person, place, and time. She appears well-developed and well-nourished.  HENT:   Head: Normocephalic and atraumatic.  Eyes: Conjunctivae and EOM are normal. Pupils are equal, round, and reactive to light.  Neck: Normal range of motion. Neck supple.  Cardiovascular: Normal rate, regular rhythm and intact distal pulses.   Pulmonary/Chest: Effort normal.  Abdominal: Soft.  Musculoskeletal: She exhibits tenderness (Right knee tender, ROM 0-105, medial joint line tenderness, positive medial McMurray, limp to the right, 1+ effusion.  Knee stable.  Left knee negative.).  Neurological: She is alert and oriented to person, place, and time. She displays normal reflexes. No cranial nerve deficit. She exhibits normal muscle tone. Coordination normal.  Skin: Skin is warm and dry.  Psychiatric: She has a normal mood and affect. Her behavior is normal. Judgment and thought content normal.  Vitals reviewed.         Assessment & Plan:   Encounter Diagnoses  Name Primary?  . Chronic pain of right knee Yes  . Cigarette nicotine dependence without complication      PROCEDURE NOTE:  The patient requests injections of the right knee , verbal consent was obtained.  The right knee was prepped appropriately after time out was performed.   Sterile technique was observed and injection of 1 cc of Depo-Medrol 40 mg with several cc's of plain xylocaine. Anesthesia was provided by ethyl chloride and a 20-gauge needle was used to inject the knee area. The injection was tolerated well.  A band aid dressing was applied.  The patient was advised to apply ice later today and tomorrow to the injection sight as needed.  I will get MRI of the right knee.  She has giving way, effusion, pain over six months despite conservative treatment, and positive medial McMurray.  I am concerned about meniscus tear.  Return after MRI.  Call if any problem.  Precautions given.  Electronically Signed Darreld McleanWayne Kimi Kroft, MD 5/9/20189:21 AM

## 2016-05-28 ENCOUNTER — Ambulatory Visit (HOSPITAL_COMMUNITY)
Admission: RE | Admit: 2016-05-28 | Discharge: 2016-05-28 | Disposition: A | Discharge status: Home / Self Care | Payer: 59 | Source: Ambulatory Visit | Attending: Orthopaedic Surgery | Admitting: Orthopaedic Surgery

## 2016-05-28 DIAGNOSIS — M25561 Pain in right knee: Secondary | ICD-10-CM | POA: Diagnosis not present

## 2016-05-28 DIAGNOSIS — S83241A Other tear of medial meniscus, current injury, right knee, initial encounter: Secondary | ICD-10-CM | POA: Insufficient documentation

## 2016-05-28 DIAGNOSIS — G8929 Other chronic pain: Secondary | ICD-10-CM | POA: Insufficient documentation

## 2016-05-28 DIAGNOSIS — X58XXXA Exposure to other specified factors, initial encounter: Secondary | ICD-10-CM | POA: Insufficient documentation

## 2016-06-01 ENCOUNTER — Encounter: Payer: Self-pay | Admitting: Orthopedic Surgery

## 2016-06-01 ENCOUNTER — Ambulatory Visit (INDEPENDENT_AMBULATORY_CARE_PROVIDER_SITE_OTHER): Payer: 59 | Admitting: Orthopedic Surgery

## 2016-06-01 VITALS — BP 135/93 | HR 68 | Wt 272.0 lb

## 2016-06-01 DIAGNOSIS — M222X2 Patellofemoral disorders, left knee: Secondary | ICD-10-CM

## 2016-06-01 DIAGNOSIS — M25561 Pain in right knee: Secondary | ICD-10-CM

## 2016-06-01 DIAGNOSIS — M222X1 Patellofemoral disorders, right knee: Secondary | ICD-10-CM

## 2016-06-01 DIAGNOSIS — M23203 Derangement of unspecified medial meniscus due to old tear or injury, right knee: Secondary | ICD-10-CM

## 2016-06-01 NOTE — Patient Instructions (Addendum)
Knee Arthroscopy Knee arthroscopy is a surgical procedure that is used to examine the inside of your knee joint and repair any damage. The surgeon puts a small, lighted instrument with a camera on the tip (arthroscope) through a small incision in your knee. The camera sends pictures to a monitor in the operating room. Your surgeon uses those pictures to guide the surgical instruments through other incisions to the area of damage. Knee arthroscopy can be used to treat many types of knee problems. It may be used:  To repair a torn ligament.  To repair or remove damaged tissue.  To remove a fluid-filled sac (cyst) from your knee. Tell a health care provider about:  Any allergies you have.  All medicines you are taking, including vitamins, herbs, eye drops, creams, and over-the-counter medicines.  Any problems you or family members have had with anesthetic medicines.  Any blood disorders you have.  Any surgeries you have had.  Any medical conditions you have. What are the risks? Generally, this is a safe procedure. However, problems may occur, including:  Infection.  Bleeding.  Damage to blood vessels, nerves, or structures of your knee.  A blood clot that forms in your leg and travels to your lung.  Failure to relieve symptoms. What happens before the procedure?  Ask your health care provider about:  Changing or stopping your regular medicines. This is especially important if you are taking diabetes medicines or blood thinners.  Taking medicines such as aspirin and ibuprofen. These medicines can thin your blood. Do not take these medicines before your procedure if your health care provider instructs you not to.  Follow your health care provider's instructions about eating or drinking restrictions.  Plan to have someone take you home after the procedure.  If you go home right after the procedure, plan to have someone with you for 24 hours.  Do not drink alcohol unless your  health care provider says that you can.  Do not use any tobacco products, including cigarettes, chewing tobacco, or electronic cigarettes unless your health care provider says that you can. If you need help quitting, ask your health care provider.  You may have a physical exam. What happens during the procedure?  An IV tube will be inserted into one of your veins.  You will be given one or more of the following:  A medicine that helps you relax (sedative).  A medicine that numbs the area (local anesthetic).  A medicine that makes you fall asleep (general anesthetic).  A medicine that is injected into your spine that numbs the area below and slightly above the injection site (spinal anesthetic).  A medicine that is injected into an area of your body that numbs everything below the injection site (regional anesthetic).  A cuff may be placed around your upper leg to slow bleeding during the procedure.  The surgeon will make a small number of incisions around your knee.  Your knee joint will be flushed and filled with a germ-free (sterile) solution.  The arthroscope will be passed through an incision into your knee joint.  More instruments will be passed through other incisions to repair your knee as needed.  The fluid will be removed from your knee.  The incisions will be closed with adhesive strips or stitches (sutures).  A bandage (dressing) will be placed over your knee. The procedure may vary among health care providers and hospitals. What happens after the procedure?  Your blood pressure, heart rate, breathing rate and   blood oxygen level will be monitored often until the medicines you were given have worn off.  You may be given medicine for pain.  You may get crutches to help you walk without using your knee to support your body weight.  You may have to wear compression stockings. These stocking help to prevent blood clots and reduce swelling in your legs. This  information is not intended to replace advice given to you by your health care provider. Make sure you discuss any questions you have with your health care provider. Document Released: 12/30/1999 Document Revised: 06/09/2015 Document Reviewed: 12/28/2013 Elsevier Interactive Patient Education  2017 Elsevier Inc.  

## 2016-06-01 NOTE — Progress Notes (Signed)
Patient ID: [name], female   DOB: [birth year], 36 y.o.   MRN: [id number]  Chief Complaint  Patient presents with  . 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 Hilda LiasKeeling with several month history of right knee pain no trauma. She has pain along the anterior part of her knee in a U-shaped fashion associated with popping clicking giving way. It is mainly a dull pain not associated with swelling or loss of motion    Review of Systems  Constitutional: Negative for chills and fever.  Musculoskeletal: Positive for joint pain.  Neurological: Negative for tingling and focal weakness.      BP (!) 135/93   Pulse 68   Wt 272 lb (123.4 kg)   BMI 46.69 kg/m  Gen. appearance is normal grooming and hygiene Orientation to person place and time normal Mood normal Gait is normal  No peripheral edema or swelling is noted in the Right or left leg Sensory exam shows normal sensation to palpation, pressure and soft touch both legs Skin exam no lacerations ulcerations or erythema both knees  Right Knee Exam   Tenderness  The patient is experiencing tenderness in the patella, lateral retinaculum, medial retinaculum, medial joint line and lateral joint line.  Range of Motion  Extension: normal  Flexion: normal Right knee flexion: 125.   Muscle Strength   The patient has normal right knee strength.  Tests  McMurray:  Medial - negative Lateral - negative Drawer:       Anterior - negative    Posterior - negative Varus: negative Valgus: negative Patellar Apprehension: positive  Other  Erythema: absent Scars: absent Sensation: normal Pulse: present Swelling: none  Comments:  The right knee is obviously more symptomatic than the left and the peripatellar structures are tender to palpation with crepitance on range of motion and positive grind test bilaterally   Left Knee Exam   Tenderness  The patient is experiencing  tenderness in the lateral joint line, medial joint line, patellar tendon, patella, medial retinaculum and lateral retinaculum.  Range of Motion  Extension: normal  Flexion: normal Left knee flexion: 125.   Muscle Strength   The patient has normal left knee strength.  Tests  McMurray:  Medial - negative Lateral - negative Drawer:       Anterior - negative     Posterior - negative Varus: negative Valgus: negative Patellar Apprehension: positive  Other  Erythema: absent Scars: absent Sensation: normal Pulse: present Swelling: none       A/P  Medical decision-making MRI was obtained with the following results IMPRESSION: 1. Tiny radial tear of the free edge of the body of the medial meniscus. 2. No focal chondral defect of the right knee.     Electronically Signed   By: Elige KoHetal  Patel   On: 05/29/2016 08:36  The patient clinically appears to have more patellofemoral symptoms right greater than left. Her free edge medial meniscal tear small is in the body does not appear to be displaced and I doubted symptomatic for all of her symptoms.  I recommend she undergo a trial of physical therapy and then if she still is not better the can try to address the knee arthroscopically but with the understanding that we have a 50-50 chance of improving the situation.  Follow-up 6 weeks  Encounter Diagnoses  Name Primary?  . Right knee pain, unspecified chronicity Yes  . Patellofemoral pain syndrome of  both knees   . Other old tear of medial meniscus of right knee     Fuller Canada, MD 06/01/2016 8:36 AM

## 2016-06-06 ENCOUNTER — Ambulatory Visit: Payer: 59 | Attending: Family Medicine | Admitting: Physical Therapy

## 2016-06-06 ENCOUNTER — Encounter: Payer: Self-pay | Admitting: Physical Therapy

## 2016-06-06 DIAGNOSIS — M6281 Muscle weakness (generalized): Secondary | ICD-10-CM | POA: Diagnosis not present

## 2016-06-06 DIAGNOSIS — G8929 Other chronic pain: Secondary | ICD-10-CM | POA: Diagnosis not present

## 2016-06-06 DIAGNOSIS — M25561 Pain in right knee: Secondary | ICD-10-CM | POA: Diagnosis not present

## 2016-06-06 DIAGNOSIS — R600 Localized edema: Secondary | ICD-10-CM | POA: Insufficient documentation

## 2016-06-06 NOTE — Therapy (Signed)
[facility] [address] [place], [place], [address] Phone: [phone]   Fax:  [phone]  Physical Therapy Evaluation  Patient Details  Name: [name] MRN: [id number] Date of Birth: June 25, 1980 Referring Provider: Vickki Hearing, MD  Encounter Date: 06/06/2016      PT End of Session - 06/06/16 1408    Visit Number 1   Number of Visits 13   Date for PT Re-Evaluation 07/18/16   PT Start Time 1330   PT Stop Time 1415   PT Time Calculation (min) 45 min   Activity Tolerance Patient tolerated treatment well   Behavior During Therapy [facility] for tasks assessed/performed      Past Medical History:  Diagnosis Date  . Arthritis   . Asthma   . Diabetes mellitus without complication (HCC)   . Hypertension   . Osteoporosis   . Thyroid disease     Past Surgical History:  Procedure Laterality Date  . extra digit removed      There were no vitals filed for this visit.       Subjective Assessment - 06/06/16 1330    Subjective pt is a 36 y.o F with R knee pain that started for about 6 months. Reported pressure and swelling with intermittent sharp pain that started 1 week ago due ot doing squats at home. pt denies any N/t. pain stays mostly in the knee with some referral down to the shine. reports popping clicking, grinding and buckling.    Limitations Standing;Walking;Other (comment);House hold activities   How long can you sit comfortably? 30 min   How long can you stand comfortably? 60 min   How long can you walk comfortably? 60 min   Diagnostic tests MRI and X-ray    Patient Stated Goals to strengthen the knee, decrease the pain, walk/ stand longer.    Currently in Pain? Yes   Pain Score 5    Pain Location Knee   Pain Orientation Right;Mid;Lower   Pain Descriptors / Indicators Throbbing;Sharp;Sore;Aching  warm   Pain Type Chronic pain   Pain Onset More than a month ago   Pain Frequency Intermittent    Aggravating Factors  walking/ standing, stairs, bending the knee   Pain Relieving Factors ice,             [facility] PT Assessment - 06/06/16 1317      Assessment   Medical Diagnosis R knee pain   Referring Provider Vickki Hearing, MD   Onset Date/Surgical Date --  6 months   Hand Dominance Right   Next MD Visit --  07/13/2016   Prior Therapy no     Precautions   Precautions None     Restrictions   Weight Bearing Restrictions No     Balance Screen   Has the patient fallen in the past 6 months Yes   How many times? 1   Has the patient had a decrease in activity level because of a fear of falling?  No   Is the patient reluctant to leave their home because of a fear of falling?  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, Conservation officer, naturecashier at [employer]   Vocation Requirements standing, walking/ kneeling,    Leisure traveling, music, exercise     Cognition   Overall Cognitive Status Within Functional Limits for tasks assessed     Observation/Other Assessments   Focus on Therapeutic Outcomes (FOTO)  56% limited  predicted 39% limited     Posture/Postural Control   Posture/Postural Control Postural limitations   Postural Limitations Rounded Shoulders;Forward head     ROM / Strength   AROM / PROM / Strength AROM;PROM;Strength     AROM   AROM Assessment Site Knee   Right/Left Knee Right;Left   Right Knee Extension 0   Right Knee Flexion 110   Left Knee Extension 0   Left Knee Flexion 108     Strength   Strength Assessment  Site Knee;Hip   Right/Left Hip Right;Left   Right Hip Flexion 4/5   Right Hip Extension 4-/5   Right Hip ABduction 4-/5   Right Hip ADduction 4/5   Left Hip Flexion 4/5   Left Hip Extension 4-/5   Left Hip ABduction 4-/5   Left Hip ADduction 4/5   Right/Left Knee Right;Left   Right Knee Flexion 4-/5   Right Knee Extension 4-/5   Left Knee Flexion 4/5   Left Knee Extension 4/5     Palpation   Patella mobility lateral patellar shift onthe R compared bil with quad activation   Palpation comment TTP at the R medial joint line, tenderness at the lateral pole of the patella, and tightess at the bicep tendionous     Special Tests    Special Tests Knee Special Tests;Meniscus Tests   Knee Special tests  --   Meniscus Tests McMurray Test;Apley's Compression;Apley's Distraction     McConnell Test   Findings --   Side --     McMurray Test   Findings Positive   Side Right     Apley's Compression   Findings Positive   Side  Right     Apley's Distraction   Findings Positive   Side Right     Ambulation/Gait   Gait Pattern Step-through pattern;Decreased stride length;Trendelenburg;Antalgic;Lateral hip instability   Gait Comments bil toe out gait                   [facility]Ex[facility] - 06/06/16 1317      Knee/Hip Exercises: Standing   Heel Raises 2 sets;10 reps;Both  with  B HHA on chair     Knee/Hip Exercises: Supine   Short Arc Quad Sets AROM;Strengthening;2 sets;10 reps  with ball squeeze for VMO contraction     Knee/Hip Exercises: Sidelying   Hip ABduction 2 sets;10 reps                PT Education - 06/06/16 1414    Education provided Yes   Education Details evaluation findings, POC, goals, HEP with form/ rationale, anatomy of the mensicus and PFPS.   Person(s) Educated Patient   Methods Explanation;Verbal cues;Handout   Comprehension Verbalized understanding;Verbal cues required          PT Short Term Goals - 06/06/16 1429       PT SHORT TERM GOAL #1   Title pt will be I with inital HEP (06/27/2016)   Time 3   Period Weeks   Status New     PT  SHORT TERM GOAL #2   Title she will be able to verbalize / demo proper lifting and walking as well as proper mechanics with navigating steps to prevent and reduce R knee pain (06/27/2016)   Time 3   Period Weeks   Status New     PT SHORT TERM GOAL #3   Title she will increase R hip abductor/ extensor strength to >/= 4/5 to promote stability at the knee with walking/ standing activities (06/27/2016)   Time 3   Period Weeks   Status New           PT Long Term Goals - 06/06/16 1433      PT LONG TERM GOAL #1   Title pt will be I with all HEP given as of last visit (07/18/2016)   Time 6   Period Weeks   Status New     PT LONG TERM GOAL #2   Title increase R hip/ knee strength to >/= 4+/5 with </= 1/10 pain during testing to support the joint with CKC activities (07/18/2016)   Time 6   Period Weeks   Status New     PT LONG TERM GOAL #3   Title she will be able to walk/ stand >/= 45 min and navigate >/= 20 steps with </= 1/10 pain for functional endurance required for work and ADLs (07/18/2016)   Time 6   Period Weeks   Status New     PT LONG TERM GOAL #4   Title increase FOTO score to </= 39% limited to demo improvement in function (07/18/2016)   Time 6   Period Weeks   Status New     PT LONG TERM GOAL #5   Title pt will be able to return to personal exercise routine with </= 1/10 pain per pt's personal goal and promote maintenance of R knee (07/18/2016)   Time 6   Period Weeks   Status New               Plan - 06/06/16 1415    Clinical Impression Statement Jaliyah presents to OPPT as a moderate complexity evaluation due to involved PMHx, worsening symptoms in the last week and evaluation findings regarding R knee pain. weakness noted in the R LLE compared bil. TTP at the R medial joint line, and lateral pole of the patella. lateral shifting patella  on the R with quad contraction. special testing consistent for meniscus pathology. she would benefit from physical therapy to decrease R knee pain, improve strength, promote stability at the patellofemoral joint and maximize her function by addressing the deficits listed.    Rehab Potential Good   PT Frequency 2x / week   PT Duration 6 weeks   PT Treatment/Interventions ADLs/Self Care Home Management;Cryotherapy;Electrical Stimulation;Moist Heat;Ultrasound;Iontophoresis 4mg /ml Dexamethasone;Stair training;Gait training;Therapeutic exercise;Therapeutic activities;Neuromuscular re-education;Dry needling;Taping;Vasopneumatic Device;Passive range of motion;Patient/family education   PT Next Visit Plan assess/ review HEP, hip/ knee strengtheing, core strength, vaso for pain and swelling PRN,    PT Home Exercise Plan SAQ with ball squeeze, sidelying hip abduction. heel raise, bridges   Consulted and Agree with Plan of Care Patient;Family member/caregiver   Family Member Consulted pt's wife      Patient will benefit from skilled therapeutic intervention in order to improve the following deficits and impairments:  Abnormal gait, Pain, Improper body mechanics, Postural dysfunction, Difficulty walking, Decreased endurance, Decreased activity tolerance, Decreased strength, Increased edema  Visit Diagnosis: Chronic pain of right knee - Plan: PT plan of care cert/re-cert  Muscle weakness (generalized) - Plan: PT plan of care cert/re-cert  Localized edema - Plan: PT plan of care cert/re-cert     Problem List Patient Active Problem List   Diagnosis Date Noted  . Cervical high risk HPV (human papillomavirus) test positive 05/03/2013   Lulu Riding PT, DPT, LAT, ATC  06/06/16  2:41 PM      [facility] [address] [place], [place], [address] Phone: [phone]   Fax:  [phone]  Name: [name] MRN: [id number] Date of Birth:  09/27/1980

## 2016-06-18 ENCOUNTER — Encounter: Payer: Self-pay | Admitting: Physical Therapy

## 2016-06-18 ENCOUNTER — Ambulatory Visit: Payer: 59 | Attending: Family Medicine | Admitting: Physical Therapy

## 2016-06-18 DIAGNOSIS — M25561 Pain in right knee: Secondary | ICD-10-CM | POA: Diagnosis not present

## 2016-06-18 DIAGNOSIS — G8929 Other chronic pain: Secondary | ICD-10-CM | POA: Insufficient documentation

## 2016-06-18 DIAGNOSIS — R600 Localized edema: Secondary | ICD-10-CM | POA: Insufficient documentation

## 2016-06-18 DIAGNOSIS — M6281 Muscle weakness (generalized): Secondary | ICD-10-CM | POA: Diagnosis not present

## 2016-06-18 MED FILL — CLINDAMYCIN HCL 150 MG CAPS: 150 | 7 days supply | Qty: 56 | Fill #0

## 2016-06-18 MED FILL — FLUCONAZOLE 150 MG TABLET: 150 | 2 days supply | Qty: 2 | Fill #0

## 2016-06-18 NOTE — Therapy (Signed)
[facility] [address] [place], [place], [address] Phone: [phone]   Fax:  [phone]  Physical Therapy Treatment  Patient Details  Name: [name] MRN: [id number] Date of Birth: 20-Sep-1980 Referring Provider: Vickki HearingHarrison, Stanley E, MD  Encounter Date: 06/18/2016      PT End of Session - 06/18/16 0923    Visit Number 2   Number of Visits 13   Date for PT Re-Evaluation 07/18/16   PT Start Time 0842   PT Stop Time 0923   PT Time Calculation (min) 41 min   Activity Tolerance Patient tolerated treatment well   Behavior During Therapy [facility] for tasks assessed/performed      Past Medical History:  Diagnosis Date  . Arthritis   . Asthma   . Diabetes mellitus without complication (HCC)   . Hypertension   . Osteoporosis   . Thyroid disease     Past Surgical History:  Procedure Laterality Date  . extra digit removed      There were no vitals filed for this visit.      Subjective Assessment - 06/18/16 0841    Subjective "still getting the sharp pain in the knee, still getting some swelling"    Currently in Pain? Yes   Pain Score 6    Pain Orientation Right   Pain Onset More than a month ago   Pain Frequency Intermittent   Aggravating Factors  walking/ standing, stairs, bending the knee   Pain Relieving Factors ice, resting                         [facility] Adult PT Treatment/Exercise - 06/18/16 0847      Knee/Hip Exercises: Standing   Gait Training stairing training 4 x 3inch, 6 x 6 inch step   training mechanics with descending steps     Knee/Hip Exercises: Supine   Short Arc Quad Sets Strengthening;Right;2 sets;15 reps  holding for 2 sec at end range   Short Arc [employer]Quad Sets Limitations with ball squeeze for VMO increased activation   Bridges with Harley-DavidsonBall Squeeze 2 sets;10 reps  with glute squeeze     Knee/Hip Exercises: Sidelying   Hip ABduction Strengthening;Right;2 sets;10 reps     Manual Therapy   Manual Therapy Soft tissue mobilization;Taping   Manual therapy comments manual trigger point release R Vastus lateralis x 3   Soft tissue mobilization DTM rolling over the R vastus lateralis   McConnell R lateral > Medial taping   trial                PT Education - 06/18/16 0910    Education provided Yes   Education Details patellar mobility and position and benefits of treatment, mechanics with climbing/ descending stairs. beneifts of taping and length of wear.  performed concurrent to treatment   Person(s) Educated Patient   Methods Explanation;Verbal cues   Comprehension Verbalized understanding;Verbal cues required          PT Short Term Goals - 06/06/16 1429      PT SHORT TERM GOAL #1   Title pt will be I with inital HEP (06/27/2016)   Time 3   Period Weeks   Status New     PT SHORT TERM GOAL #2   Title she will be able to verbalize / demo proper lifting and walking as well as proper mechanics with navigating steps to prevent and reduce R knee pain (06/27/2016)   Time 3  Period Weeks   Status New     PT SHORT TERM GOAL #3   Title she will increase R hip abductor/ extensor strength to >/= 4/5 to promote stability at the knee with walking/ standing activities (06/27/2016)   Time 3   Period Weeks   Status New           PT Long Term Goals - 06/06/16 1433      PT LONG TERM GOAL #1   Title pt will be I with all HEP given as of last visit (07/18/2016)   Time 6   Period Weeks   Status New     PT LONG TERM GOAL #2   Title increase R hip/ knee strength to >/= 4+/5 with </= 1/10 pain during testing to support the joint with CKC activities (07/18/2016)   Time 6   Period Weeks   Status New     PT LONG TERM GOAL #3   Title she will be able to walk/ stand >/= 45 min and navigate >/= 20 steps with </= 1/10 pain for functional endurance required for work and ADLs (07/18/2016)   Time 6   Period Weeks   Status New     PT LONG TERM GOAL #4    Title increase FOTO score to </= 39% limited to demo improvement in function (07/18/2016)   Time 6   Period Weeks   Status New     PT LONG TERM GOAL #5   Title pt will be able to return to personal exercise routine with </= 1/10 pain per pt's personal goal and promote maintenance of R knee (07/18/2016)   Time 6   Period Weeks   Status New               Plan - 06/18/16 1610    Clinical Impression Statement pt reports consistency with her HEP but still has soreness in the knee. Reviewed previously provided HEP. Manual techniques to reduce vastus lateralis tightness, and VMO facilitation techniques. taping to help patellar positioning which she reported relief of pain.  post session she reported no pain and declined modalities.    PT Next Visit Plan how was McConnel taping, hip/ knee strengtheing, core strength, vaso for pain and swelling PRN, manual for vastus lateralis tightness   PT Home Exercise Plan SAQ with ball squeeze, sidelying hip abduction. heel raise, bridges   Consulted and Agree with Plan of Care Patient      Patient will benefit from skilled therapeutic intervention in order to improve the following deficits and impairments:     Visit Diagnosis: Chronic pain of right knee  Muscle weakness (generalized)  Localized edema     Problem List Patient Active Problem List   Diagnosis Date Noted  . Cervical high risk HPV (human papillomavirus) test positive 05/03/2013   [name] PT, DPT, LAT, ATC  06/18/16  9:28 AM      [facility] [address] [place], [place], [address] Phone: [phone]   Fax:  [phone]  Name: [name] MRN: [id number] Date of Birth: [birth year]

## 2016-06-20 ENCOUNTER — Ambulatory Visit: Payer: 59 | Admitting: Physical Therapy

## 2016-06-20 DIAGNOSIS — G8929 Other chronic pain: Secondary | ICD-10-CM | POA: Diagnosis not present

## 2016-06-20 DIAGNOSIS — R600 Localized edema: Secondary | ICD-10-CM | POA: Diagnosis not present

## 2016-06-20 DIAGNOSIS — M6281 Muscle weakness (generalized): Secondary | ICD-10-CM | POA: Diagnosis not present

## 2016-06-20 DIAGNOSIS — M25561 Pain in right knee: Secondary | ICD-10-CM | POA: Diagnosis not present

## 2016-06-20 NOTE — Therapy (Signed)
[facility] [address] [place], [place], [address] Phone: [phone]   Fax:  [phone]  Physical Therapy Treatment  Patient Details  Name: [name] MRN: [id number] Date of Birth: 14-May-1980 Referring Provider: Vickki Hearing, MD  Encounter Date: 06/20/2016      PT End of Session - 06/20/16 0804    Visit Number 3   Number of Visits 13   Date for PT Re-Evaluation 07/18/16   PT Start Time 0800   PT Stop Time 0840   PT Time Calculation (min) 40 min      Past Medical History:  Diagnosis Date  . Arthritis   . Asthma   . Diabetes mellitus without complication (HCC)   . Hypertension   . Osteoporosis   . Thyroid disease     Past Surgical History:  Procedure Laterality Date  . extra digit removed      There were no vitals filed for this visit.      Subjective Assessment - 06/20/16 0802    Subjective Tape was helpful while at work. It lasted 5 hours.    Currently in Pain? 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 instead of kneeling/squatting      Knee/Hip Exercises: Doctor, hospital Limitations 1 rep 90 sec on slant board      Knee/Hip Exercises: Aerobic   Recumbent Bike L1 x 5 minuts      Knee/Hip Exercises: Standing   Heel Raises 2 sets;10 reps;Both  with  B HHA on counter     Knee/Hip Exercises: Seated   Long Arc Quad 10 reps   Long Arc Quad Limitations with ball squeeze    Hamstring Curl 15 reps   Hamstring Limitations green band     Knee/Hip Exercises: Supine   Short Arc Quad Sets Strengthening;Right;2 sets;15 reps  holding for 2 sec at end range   Short Arc [employer] Limitations with ball squeeze    Bridges Limitations x10   Bridges with [employer] 1  set;10 reps   Other Supine Knee/Hip Exercises ball squeeze x 10 , clams with green band x 20      Knee/Hip Exercises: Sidelying   Hip ABduction Strengthening;Right;2 sets;10 reps   Clams 10 x 2 then reverse 10 x 2      Manual Therapy   McConnell R lateral > Medial taping                   PT Short Term Goals - 06/06/16 1429      PT SHORT TERM GOAL #1   Title pt will be I with inital HEP (06/27/2016)   Time 3   Period Weeks   Status New     PT SHORT TERM GOAL #2   Title she will be able to verbalize / demo proper lifting and walking as well as proper mechanics with navigating steps to prevent and reduce R knee pain (06/27/2016)   Time 3   Period Weeks   Status New     PT SHORT TERM GOAL #3   Title she will increase R hip abductor/ extensor strength  to >/= 4/5 to promote stability at the knee with walking/ standing activities (06/27/2016)   Time 3   Period Weeks   Status New           PT Long Term Goals - 06/06/16 1433      PT LONG TERM GOAL #1   Title pt will be I with all HEP given as of last visit (07/18/2016)   Time 6   Period Weeks   Status New     PT LONG TERM GOAL #2   Title increase R hip/ knee strength to >/= 4+/5 with </= 1/10 pain during testing to support the joint with CKC activities (07/18/2016)   Time 6   Period Weeks   Status New     PT LONG TERM GOAL #3   Title she will be able to walk/ stand >/= 45 min and navigate >/= 20 steps with </= 1/10 pain for functional endurance required for work and ADLs (07/18/2016)   Time 6   Period Weeks   Status New     PT LONG TERM GOAL #4   Title increase FOTO score to </= 39% limited to demo improvement in function (07/18/2016)   Time 6   Period Weeks   Status New     PT LONG TERM GOAL #5   Title pt will be able to return to personal exercise routine with </= 1/10 pain per pt's personal goal and promote maintenance of R knee (07/18/2016)   Time 6   Period Weeks   Status New               Plan  - 06/20/16 [id number]    Clinical Impression Statement Pt reports no pain today and tape on patella last visit decreased her pain for 5 hours while at work. We continued exercises to improve quad strength and VMO activation. Also worked lateral hip strength with pt reporting muscles burning but no increased pain. Soreness reported in thighs and hips at end of treatment. McConnel tape reapplied per last visit.    PT Next Visit Plan how was McConnel taping, hip/ knee strengtheing, core strength, vaso for pain and swelling PRN, manual for vastus lateralis tightness   PT Home Exercise Plan SAQ with ball squeeze, sidelying hip abduction. heel raise, bridges   Consulted and Agree with Plan of Care Patient      Patient will benefit from skilled therapeutic intervention in order to improve the following deficits and impairments:  Abnormal gait, Pain, Improper body mechanics, Postural dysfunction, Difficulty walking, Decreased endurance, Decreased activity tolerance, Decreased strength, Increased edema  Visit Diagnosis: Chronic pain of right knee  Muscle weakness (generalized)  Localized edema     Problem List Patient Active Problem List   Diagnosis Date Noted  . 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 [address] [place], [place], [id number] Phone: [phone]   Fax:  [phone]  Name: [name] MRN: [id number] Date of Birth: [birth year]

## 2016-06-25 ENCOUNTER — Ambulatory Visit: Payer: 59 | Admitting: Physical Therapy

## 2016-06-25 DIAGNOSIS — R600 Localized edema: Secondary | ICD-10-CM

## 2016-06-25 DIAGNOSIS — M25561 Pain in right knee: Secondary | ICD-10-CM | POA: Diagnosis not present

## 2016-06-25 DIAGNOSIS — G8929 Other chronic pain: Secondary | ICD-10-CM

## 2016-06-25 DIAGNOSIS — M6281 Muscle weakness (generalized): Secondary | ICD-10-CM | POA: Diagnosis not present

## 2016-06-25 NOTE — Therapy (Signed)
[place], [place], [address] Phone: [phone]   Fax:  [phone]  Physical Therapy Treatment  Patient Details  Name: [name] MRN: [id number] Date of Birth: 1980-02-13 Referring Provider: Carole Civil, MD  Encounter Date: 06/25/2016      PT End of Session - 06/25/16 0912    Visit Number 4   Number of Visits 13   Date for PT Re-Evaluation 07/18/16   PT Start Time 0845   PT Stop Time 0940   PT Time Calculation (min) 55 min      Past Medical History:  Diagnosis Date  . Arthritis   . Asthma   . Diabetes mellitus without complication ([place])   . Hypertension   . Osteoporosis   . Thyroid disease     Past Surgical History:  Procedure Laterality Date  . extra digit removed      There were no vitals filed for this visit.      Subjective Assessment - 06/25/16 0931    Subjective I twisted my ankle in a parking lot and it hurt my knee.My knee went to the side.    Currently in Pain? Yes   Pain Score 7    Pain Location Knee   Pain Orientation Right;Lateral   Aggravating Factors  wlaking standing   Pain Relieving Factors ice, rest                          [facility] Adult PT Treatment/Exercise - 06/25/16 0001      Knee/Hip Exercises: Aerobic   Nustep L5 x 5 minutes , c/o lateral knee pain     Knee/Hip Exercises: Seated   Long Arc Quad 10 reps   Long Arc Quad Limitations with ball squeeze      Knee/Hip Exercises: Supine   Short Arc Quad Sets Strengthening;Right;2 sets;15 reps  holding for 2 sec at end range   [employer] Limitations with ball squeeze    Bridges Limitations x10   Bridges with [employer] 1 set;10 reps   Straight Leg Raises 10 reps     Knee/Hip Exercises: Sidelying   Hip ABduction Strengthening;Right;2 sets;10 reps   Clams 10 x 2 then reverse 10 x 2      Manual Therapy   Manual therapy comments manual trigger point release R  Vastus lateralis x 3   Soft tissue mobilization DTM rolling over the R vastus lateralis   McConnell R lateral > Medial taping                   PT Short Term Goals - 06/06/16 1429      PT SHORT TERM GOAL #1   Title pt will be I with inital HEP (06/27/2016)   Time 3   Period Weeks   Status New     PT SHORT TERM GOAL #2   Title she will be able to verbalize / demo proper lifting and walking as well as proper mechanics with navigating steps to prevent and reduce R knee pain (06/27/2016)   Time 3   Period Weeks   Status New     PT SHORT TERM GOAL #3   Title she will increase R hip abductor/ extensor strength to >/= 4/5 to promote stability at the knee with walking/ standing activities (06/27/2016)   Time 3   Period Weeks   Status New  PT Long Term Goals - 06/06/16 1433      PT LONG TERM GOAL #1   Title pt will be I with all HEP given as of last visit (07/18/2016)   Time 6   Period Weeks   Status New     PT LONG TERM GOAL #2   Title increase R hip/ knee strength to >/= 4+/5 with </= 1/10 pain during testing to support the joint with CKC activities (07/18/2016)   Time 6   Period Weeks   Status New     PT LONG TERM GOAL #3   Title she will be able to walk/ stand >/= 45 min and navigate >/= 20 steps with </= 1/10 pain for functional endurance required for work and ADLs (07/18/2016)   Time 6   Period Weeks   Status New     PT LONG TERM GOAL #4   Title increase FOTO score to </= 39% limited to demo improvement in function (07/18/2016)   Time 6   Period Weeks   Status New     PT LONG TERM GOAL #5   Title pt will be able to return to personal exercise routine with </= 1/10 pain per pt's personal goal and promote maintenance of R knee (07/18/2016)   Time 6   Period Weeks   Status New               Plan - 06/25/16 0935    Clinical Impression Statement Pt arrives c/o 7/10 knee pain after twisting ankle and straining knee. SHe demonstrates tenderness and  spasms along vastus lateralis. Performed manual soft tisse work to decrease lateral pull on patella. Also reapplied patella tracking tape which pt reports decreases her pain for the entire day. No additional gaols met due to pain exacerbation. Pt reports a little decrease in pain at end of session. Ice applied to knee for soreness. Advised pt to massage her lateral quadriceps.    PT Home Exercise Plan SAQ with ball squeeze, sidelying hip abduction. heel raise, bridges   Consulted and Agree with Plan of Care Patient      Patient will benefit from skilled therapeutic intervention in order to improve the following deficits and impairments:  Abnormal gait, Pain, Improper body mechanics, Postural dysfunction, Difficulty walking, Decreased endurance, Decreased activity tolerance, Decreased strength, Increased edema  Visit Diagnosis: Chronic pain of right knee  Muscle weakness (generalized)  Localized edema     Problem List Patient Active Problem List   Diagnosis Date Noted  . Cervical high risk HPV (human papillomavirus) test positive 05/03/2013    Dorene Ar, PTA 06/25/2016, 9:42 AM  [facility] [address] [place], [place], [address] Phone: [phone]   Fax:  [phone]  Name: [name] MRN: [id number] Date of Birth: Jun 15, 1980

## 2016-06-27 ENCOUNTER — Ambulatory Visit: Payer: 59 | Admitting: Physical Therapy

## 2016-06-27 DIAGNOSIS — G8929 Other chronic pain: Secondary | ICD-10-CM

## 2016-06-27 DIAGNOSIS — M25561 Pain in right knee: Secondary | ICD-10-CM | POA: Diagnosis not present

## 2016-06-27 DIAGNOSIS — R600 Localized edema: Secondary | ICD-10-CM | POA: Diagnosis not present

## 2016-06-27 DIAGNOSIS — M6281 Muscle weakness (generalized): Secondary | ICD-10-CM | POA: Diagnosis not present

## 2016-06-27 NOTE — Therapy (Signed)
[place], [place], [address] Phone: [phone]   Fax:  [phone]  Physical Therapy Treatment  Patient Details  Name: [name] MRN: [id number] Date of Birth: 07-11-1980 Referring Provider: Carole Civil, MD  Encounter Date: 06/27/2016      PT End of Session - 06/27/16 0802    Visit Number 5   Number of Visits 13   Date for PT Re-Evaluation 07/18/16   PT Start Time 0752   PT Stop Time 0840   PT Time Calculation (min) 48 min      Past Medical History:  Diagnosis Date  . Arthritis   . Asthma   . Diabetes mellitus without complication ([place])   . Hypertension   . Osteoporosis   . Thyroid disease     Past Surgical History:  Procedure Laterality Date  . extra digit removed      There were no vitals filed for this visit.      Subjective Assessment - 06/27/16 0801    Subjective My knee is still hurting.    Currently in Pain? Yes   Pain Score 9    Pain Location Knee   Pain Orientation Right;Medial   Pain Descriptors / Indicators Sharp;Throbbing            [facility] PT Assessment - 06/27/16 0001      Strength   Right Hip ABduction 4-/5                     [facility] Adult PT Treatment/Exercise - 06/27/16 0001      Knee/Hip Exercises: Aerobic   Nustep L4 x 6 minutes      Knee/Hip Exercises: Seated   Long Arc Quad 20 reps   Marching Limitations x 15     Knee/Hip Exercises: Supine   Short Arc Quad Sets Strengthening;Right;2 sets;15 reps  holding for 2 sec at end range   Short Arc [employer] Limitations with ball squeeze   Bridges with [employer] 1 set;10 reps   Straight Leg Raises 10 reps   Straight Leg Raise with External Rotation 10 reps     Knee/Hip Exercises: Sidelying   Hip ABduction Strengthening;Right;2 sets;10 reps   Clams 10 x 2 then reverse 10 x 2      Modalities   Modalities Iontophoresis     Iontophoresis   Type of Iontophoresis  Dexamethasone   Location Right lateral knee   Dose 1 ml    Time 6 hour patch      Manual Therapy   Manual therapy comments manual trigger point release R Vastus lateralis x 3   Soft tissue mobilization DTM rolling over the R vastus lateralis   McConnell R lateral > Medial taping   medial tilt                   PT Short Term Goals - 06/27/16 0844      PT SHORT TERM GOAL #1   Title pt will be I with inital HEP (06/27/2016)   Time 3   Period Weeks   Status Partially Met     PT SHORT TERM GOAL #2   Title she will be able to verbalize / demo proper lifting and walking as well as proper mechanics with navigating steps to prevent and reduce R knee pain (06/27/2016)   Time 3   Period Weeks   Status On-going     PT SHORT TERM GOAL #3  Title she will increase R hip abductor/ extensor strength to >/= 4/5 to promote stability at the knee with walking/ standing activities (06/27/2016)   Time 3   Period Weeks   Status On-going           PT Long Term Goals - 06/06/16 1433      PT LONG TERM GOAL #1   Title pt will be I with all HEP given as of last visit (07/18/2016)   Time 6   Period Weeks   Status New     PT LONG TERM GOAL #2   Title increase R hip/ knee strength to >/= 4+/5 with </= 1/10 pain during testing to support the joint with CKC activities (07/18/2016)   Time 6   Period Weeks   Status New     PT LONG TERM GOAL #3   Title she will be able to walk/ stand >/= 45 min and navigate >/= 20 steps with </= 1/10 pain for functional endurance required for work and ADLs (07/18/2016)   Time 6   Period Weeks   Status New     PT LONG TERM GOAL #4   Title increase FOTO score to </= 39% limited to demo improvement in function (07/18/2016)   Time 6   Period Weeks   Status New     PT LONG TERM GOAL #5   Title pt will be able to return to personal exercise routine with </= 1/10 pain per pt's personal goal and promote maintenance of R knee (07/18/2016)   Time 6   Period Weeks    Status New               Plan - 06/27/16 6720    Clinical Impression Statement Pt continues with c/ o pain after rolling ankle and straining knee 3 days ago. Today her pain is more medial with tenderness along medial and lateral quad. Gentle therex followed by IASTM to medial and lateral quad performed to decreased tenderness. Mcconnel tape applied for medial pull and medial tilt to patella. Pt reports 6/10 pain after treatment. Trial of Ionto patch to lateral knee as this is the area of most tenderness/soreness.    PT Next Visit Plan how was McConnel taping, hip/ knee strengtheing, core strength, vaso for pain and swelling PRN, manual for vastus lateralis tightness; how was ionto?    PT Home Exercise Plan SAQ with ball squeeze, sidelying hip abduction. heel raise, bridges   Consulted and Agree with Plan of Care Patient      Patient will benefit from skilled therapeutic intervention in order to improve the following deficits and impairments:  Abnormal gait, Pain, Improper body mechanics, Postural dysfunction, Difficulty walking, Decreased endurance, Decreased activity tolerance, Decreased strength, Increased edema  Visit Diagnosis: Chronic pain of right knee  Muscle weakness (generalized)  Localized edema     Problem List Patient Active Problem List   Diagnosis Date Noted  . Cervical high risk HPV (human papillomavirus) test positive 05/03/2013    Dorene Ar, PTA 06/27/2016, 8:46 AM  [facility] [address] [place], [place], [address] Phone: [phone]   Fax:  [phone]  Name: [name] MRN: [id number] Date of Birth: Dec 21, 1980

## 2016-07-02 ENCOUNTER — Encounter: Payer: Self-pay | Admitting: Physical Therapy

## 2016-07-02 ENCOUNTER — Ambulatory Visit: Payer: 59 | Admitting: Physical Therapy

## 2016-07-02 DIAGNOSIS — R600 Localized edema: Secondary | ICD-10-CM

## 2016-07-02 DIAGNOSIS — G8929 Other chronic pain: Secondary | ICD-10-CM | POA: Diagnosis not present

## 2016-07-02 DIAGNOSIS — M25561 Pain in right knee: Principal | ICD-10-CM

## 2016-07-02 DIAGNOSIS — M6281 Muscle weakness (generalized): Secondary | ICD-10-CM | POA: Diagnosis not present

## 2016-07-02 NOTE — Therapy (Addendum)
[employer], [place], [address] Phone: [phone]   Fax:  [phone]  Physical Therapy Treatment  Patient Details  Name: [name] MRN: [id number] Date of Birth: Feb 05, 1980 Referring Provider: Carole Civil, MD  Encounter Date: 07/02/2016      PT End of Session - 07/02/16 0844    Visit Number 6   Number of Visits 13   Date for PT Re-Evaluation 07/18/16   PT Start Time 0800   PT Stop Time 0844   PT Time Calculation (min) 44 min   Activity Tolerance Patient tolerated treatment well   Behavior During Therapy [facility] for tasks assessed/performed      Past Medical History:  Diagnosis Date  . Arthritis   . Asthma   . Diabetes mellitus without complication ([place])   . Hypertension   . Osteoporosis   . Thyroid disease     Past Surgical History:  Procedure Laterality Date  . extra digit removed      There were no vitals filed for this visit.      Subjective Assessment - 07/02/16 0759    Subjective "I am feeling more pain and swelling in my knee over the weekend"    Currently in Pain? Yes   Pain Score 9    Pain Location Knee   Pain Orientation Right   Pain Descriptors / Indicators Throbbing   Pain Type Chronic pain   Pain Onset More than a month ago   Pain Frequency Intermittent   Aggravating Factors  walking, standing, bending the knee                         [facility] Adult PT Treatment/Exercise - 07/02/16 0841      Knee/Hip Exercises: Supine   Quad Sets Strengthening;2 sets;20 reps  with feet elevated on bolster with adductor squeeze   Quad Sets Limitations to reduce swelling     Iontophoresis   Type of Iontophoresis Dexamethasone   Location Right lateral knee   Dose 1 ml    Time 6 hour patch      Manual Therapy   Manual Therapy Other (comment)   Manual therapy comments manual trigger point release R Vastus lateralis x 3 / distal rectus femoris x 3   Soft  tissue mobilization DTM rolling over the R vastus lateralis/ distal rectus femoris, IASTM over the distal rectus femoris and quad tendon   Other Manual Therapy antegrade/ retrograde massage  educated how to do at home.    McConnell R lateral > Medial taping                 PT Education - 07/02/16 0843    Education provided Yes   Education Details benefits of elvation for swelling and muscle activation for pump action, antegrade/ retrograde massage techniqus for edema, pain relief techniques via rubbing with a towel   concurrent to treatment   Person(s) Educated Patient   Methods Explanation;Verbal cues;Demonstration   Comprehension Verbalized understanding;Verbal cues required          PT Short Term Goals - 06/27/16 0844      PT SHORT TERM GOAL #1   Title pt will be I with inital HEP (06/27/2016)   Time 3   Period Weeks   Status Partially Met     PT SHORT TERM GOAL #2   Title she will be able to verbalize / demo proper lifting and walking as well as  proper mechanics with navigating steps to prevent and reduce R knee pain (06/27/2016)   Time 3   Period Weeks   Status On-going     PT SHORT TERM GOAL #3   Title she will increase R hip abductor/ extensor strength to >/= 4/5 to promote stability at the knee with walking/ standing activities (06/27/2016)   Time 3   Period Weeks   Status On-going           PT Long Term Goals - 06/06/16 1433      PT LONG TERM GOAL #1   Title pt will be I with all HEP given as of last visit (07/18/2016)   Time 6   Period Weeks   Status New     PT LONG TERM GOAL #2   Title increase R hip/ knee strength to >/= 4+/5 with </= 1/10 pain during testing to support the joint with CKC activities (07/18/2016)   Time 6   Period Weeks   Status New     PT LONG TERM GOAL #3   Title she will be able to walk/ stand >/= 45 min and navigate >/= 20 steps with </= 1/10 pain for functional endurance required for work and ADLs (07/18/2016)   Time 6    Period Weeks   Status New     PT LONG TERM GOAL #4   Title increase FOTO score to </= 39% limited to demo improvement in function (07/18/2016)   Time 6   Period Weeks   Status New     PT LONG TERM GOAL #5   Title pt will be able to return to personal exercise routine with </= 1/10 pain per pt's personal goal and promote maintenance of R knee (07/18/2016)   Time 6   Period Weeks   Status New               Plan - 07/02/16 0844    Clinical Impression Statement pt arrived reporting 9/10 pain. due to high pain opted to focus on on pain and edema management today with manual retro/antegrade massage technques, quad sets with elevation. continued ionto with McConnel taping to promote pain relief and patellar postion. post session she reported pain at 3/10.    PT Next Visit Plan how was McConnel taping, hip/ knee strengtheing, core strength, vaso for pain and swelling PRN, manual for vastus lateralis tightness; how was ionto?    PT Home Exercise Plan SAQ with ball squeeze, sidelying hip abduction. heel raise, bridges   Consulted and Agree with Plan of Care Patient      Patient will benefit from skilled therapeutic intervention in order to improve the following deficits and impairments:  Abnormal gait, Pain, Improper body mechanics, Postural dysfunction, Difficulty walking, Decreased endurance, Decreased activity tolerance, Decreased strength, Increased edema  Visit Diagnosis: Chronic pain of right knee  Muscle weakness (generalized)  Localized edema     Problem List Patient Active Problem List   Diagnosis Date Noted  . Cervical high risk HPV (human papillomavirus) test positive 05/03/2013   Starr [place] PT, DPT, LAT, ATC  07/02/16  8:51 AM      [facility] [address] [place], [place], [address] Phone: [phone]   Fax:  [phone]  Name: [name] MRN: [id number] Date of Birth: 13-Jan-1981

## 2016-07-04 ENCOUNTER — Ambulatory Visit: Payer: 59 | Admitting: Physical Therapy

## 2016-07-04 DIAGNOSIS — R600 Localized edema: Secondary | ICD-10-CM | POA: Diagnosis not present

## 2016-07-04 DIAGNOSIS — G8929 Other chronic pain: Secondary | ICD-10-CM

## 2016-07-04 DIAGNOSIS — M6281 Muscle weakness (generalized): Secondary | ICD-10-CM | POA: Diagnosis not present

## 2016-07-04 DIAGNOSIS — M25561 Pain in right knee: Principal | ICD-10-CM

## 2016-07-04 NOTE — Therapy (Signed)
[place], [place], [address] Phone: [phone]   Fax:  [phone]  Physical Therapy Treatment  Patient Details  Name: [name] MRN: [id number] Date of Birth: Aug 10, 1980 Referring Provider: Carole Civil, MD  Encounter Date: 07/04/2016      PT End of Session - 07/04/16 5631    Visit Number 7   Number of Visits 13   Date for PT Re-Evaluation 07/18/16   PT Start Time 0800   PT Stop Time 0840   PT Time Calculation (min) 40 min      Past Medical History:  Diagnosis Date  . Arthritis   . Asthma   . Diabetes mellitus without complication ([place])   . Hypertension   . Osteoporosis   . Thyroid disease     Past Surgical History:  Procedure Laterality Date  . extra digit removed      There were no vitals filed for this visit.      Subjective Assessment - 07/04/16 0821    Subjective Knee feels better. I am still getting sharp pains in my knee.    Currently in Pain? Yes   Pain Score 5    Pain Location Knee   Pain Orientation Right                         [facility] Adult PT Treatment/Exercise - 07/04/16 0001      Knee/Hip Exercises: Aerobic   Nustep L5 x 6 minutes      Knee/Hip Exercises: Seated   Long Arc Quad 20 reps   Long Arc Quad Weight 5 lbs.   Hamstring Curl 15 reps   Hamstring Limitations green     Knee/Hip Exercises: Supine   Short Arc Quad Sets Strengthening;Right;2 sets;15 reps  holding for 2 sec at end range   Short Arc [employer] Limitations 5#   Bridges with [employer] 1 set;10 reps   Straight Leg Raises 15 reps     Knee/Hip Exercises: Sidelying   Hip ABduction Strengthening;Right;2 sets;10 reps   Clams 10 x 2 then reverse 10 x 2      Iontophoresis   Type of Iontophoresis Dexamethasone   Location Right lateral knee   Dose 1 ml    Time 6 hour patch      Manual Therapy   Soft tissue mobilization DTM rolling over the R vastus lateralis/  distal rectus femoris, IASTM over the distal rectus femoris and quad tendon                  PT Short Term Goals - 06/27/16 0844      PT SHORT TERM GOAL #1   Title pt will be I with inital HEP (06/27/2016)   Time 3   Period Weeks   Status Partially Met     PT SHORT TERM GOAL #2   Title she will be able to verbalize / demo proper lifting and walking as well as proper mechanics with navigating steps to prevent and reduce R knee pain (06/27/2016)   Time 3   Period Weeks   Status On-going     PT SHORT TERM GOAL #3   Title she will increase R hip abductor/ extensor strength to >/= 4/5 to promote stability at the knee with walking/ standing activities (06/27/2016)   Time 3   Period Weeks   Status On-going           PT Long Term  Goals - 06/06/16 1433      PT LONG TERM GOAL #1   Title pt will be I with all HEP given as of last visit (07/18/2016)   Time 6   Period Weeks   Status New     PT LONG TERM GOAL #2   Title increase R hip/ knee strength to >/= 4+/5 with </= 1/10 pain during testing to support the joint with CKC activities (07/18/2016)   Time 6   Period Weeks   Status New     PT LONG TERM GOAL #3   Title she will be able to walk/ stand >/= 45 min and navigate >/= 20 steps with </= 1/10 pain for functional endurance required for work and ADLs (07/18/2016)   Time 6   Period Weeks   Status New     PT LONG TERM GOAL #4   Title increase FOTO score to </= 39% limited to demo improvement in function (07/18/2016)   Time 6   Period Weeks   Status New     PT LONG TERM GOAL #5   Title pt will be able to return to personal exercise routine with </= 1/10 pain per pt's personal goal and promote maintenance of R knee (07/18/2016)   Time 6   Period Weeks   Status New               Plan - 07/04/16 9518    Clinical Impression Statement Pt reports pain reduced to 5/10. Focused strengthening and manual for trigger point release to distal quads. Trial of ionto on medial  knee. Working toward [employer].    PT Next Visit Plan how was McConnel taping, hip/ knee strengtheing, core strength, vaso for pain and swelling PRN, manual for vastus lateralis tightness; how was ionto?    PT Home Exercise Plan SAQ with ball squeeze, sidelying hip abduction. heel raise, bridges   Consulted and Agree with Plan of Care Patient      Patient will benefit from skilled therapeutic intervention in order to improve the following deficits and impairments:  Abnormal gait, Pain, Improper body mechanics, Postural dysfunction, Difficulty walking, Decreased endurance, Decreased activity tolerance, Decreased strength, Increased edema  Visit Diagnosis: Chronic pain of right knee  Muscle weakness (generalized)  Localized edema     Problem List Patient Active Problem List   Diagnosis Date Noted  . Cervical high risk HPV (human papillomavirus) test positive 05/03/2013    Dorene Ar, PTA 07/04/2016, 8:52 AM  [facility] [address] [place], [place], [address] Phone: [phone]   Fax:  [phone]  Name: [name] MRN: [id number] Date of Birth: April 03, 1980

## 2016-07-09 ENCOUNTER — Ambulatory Visit: Payer: 59 | Admitting: Physical Therapy

## 2016-07-09 DIAGNOSIS — R600 Localized edema: Secondary | ICD-10-CM | POA: Diagnosis not present

## 2016-07-09 DIAGNOSIS — M6281 Muscle weakness (generalized): Secondary | ICD-10-CM

## 2016-07-09 DIAGNOSIS — G8929 Other chronic pain: Secondary | ICD-10-CM

## 2016-07-09 DIAGNOSIS — M25561 Pain in right knee: Secondary | ICD-10-CM | POA: Diagnosis not present

## 2016-07-09 NOTE — Therapy (Signed)
[place], [place], [address] Phone: [phone]   Fax:  [phone]  Physical Therapy Treatment  Patient Details  Name: [name] MRN: [id number] Date of Birth: 30-Aug-1980 Referring Provider: Carole Civil, MD  Encounter Date: 07/09/2016      PT End of Session - 07/09/16 0803    Visit Number 8   Number of Visits 13   Date for PT Re-Evaluation 07/18/16   PT Start Time 0800   PT Stop Time 0840   PT Time Calculation (min) 40 min      Past Medical History:  Diagnosis Date  . Arthritis   . Asthma   . Diabetes mellitus without complication ([place])   . Hypertension   . Osteoporosis   . Thyroid disease     Past Surgical History:  Procedure Laterality Date  . extra digit removed      There were no vitals filed for this visit.      Subjective Assessment - 07/09/16 0800    Subjective The patch was zapping and pinching so I took it off. My knee is still painful. I was denied disability, I am going to appeal.    Currently in Pain? Yes   Pain Score 8    Pain Location Knee   Pain Orientation Right   Pain Descriptors / Indicators Sharp   Aggravating Factors  walking, standing   Pain Relieving Factors ice             [facility] PT Assessment - 07/09/16 0001      Observation/Other Assessments   Focus on Therapeutic Outcomes (FOTO)  77% limitation      Strength   Right Hip ABduction 4-/5   Left Hip ABduction 4/5   Right Knee Flexion 4/5   Right Knee Extension 4/5                     [facility] Adult PT Treatment/Exercise - 07/09/16 0001      Knee/Hip Exercises: Aerobic   Nustep L3 x 7 minutes  reduced from level 6 due to pain     Knee/Hip Exercises: Seated   Long Arc Quad 20 reps   Long Arc Quad Weight 5 lbs.   Other Seated Knee/Hip Exercises Toe Yoga, heel raises, toe raises, arch lifts    Hamstring Curl 15 reps   Hamstring Limitations green     Knee/Hip  Exercises: Supine   Straight Leg Raises 15 reps     Manual Therapy   McConnell Right arch support tape 3 4 strips                   PT Short Term Goals - 07/09/16 0929      PT SHORT TERM GOAL #1   Title pt will be I with inital HEP (06/27/2016)   Time 3   Period Weeks   Status Partially Met     PT SHORT TERM GOAL #2   Title she will be able to verbalize / demo proper lifting and walking as well as proper mechanics with navigating steps to prevent and reduce R knee pain (06/27/2016)   Time 3   Period Weeks   Status Unable to assess     PT SHORT TERM GOAL #3   Title she will increase R hip abductor/ extensor strength to >/= 4/5 to promote stability at the knee with walking/ standing activities (06/27/2016)   Time 3   Period Weeks  Status Partially Met           PT Long Term Goals - 06/06/16 1433      PT LONG TERM GOAL #1   Title pt will be I with all HEP given as of last visit (07/18/2016)   Time 6   Period Weeks   Status New     PT LONG TERM GOAL #2   Title increase R hip/ knee strength to >/= 4+/5 with </= 1/10 pain during testing to support the joint with CKC activities (07/18/2016)   Time 6   Period Weeks   Status New     PT LONG TERM GOAL #3   Title she will be able to walk/ stand >/= 45 min and navigate >/= 20 steps with </= 1/10 pain for functional endurance required for work and ADLs (07/18/2016)   Time 6   Period Weeks   Status New     PT LONG TERM GOAL #4   Title increase FOTO score to </= 39% limited to demo improvement in function (07/18/2016)   Time 6   Period Weeks   Status New     PT LONG TERM GOAL #5   Title pt will be able to return to personal exercise routine with </= 1/10 pain per pt's personal goal and promote maintenance of R knee (07/18/2016)   Time 6   Period Weeks   Status New               Plan - 07/09/16 1103    Clinical Impression Statement Pt reports continued sharp pains in right knee and increasing pain in left  knee. She is also dealing with bilateral foot pain. Performed a trial of arch support tape to right foot with pt reporting pain decreased from 8/10 to 5/10. Began foot intrinsic exercises. Pt's FOTO score decreased to 77% limited, she was only 56% limited on evaluation. She reports no change with walking and standing tolerance. Her right knee strength has improved. STG#3 partially met.    PT Next Visit Plan ERO vs discharge? pt sees MD at end of week . how was McConnel taping to arch??, hip/ knee strengtheing, core strength, vaso for pain and swelling PRN, manual for vastus lateralis tightness; dicontinued ionto due to reaction (zapping and pinching with bruising)    PT Home Exercise Plan SAQ with ball squeeze, sidelying hip abduction. heel raise, bridges   Consulted and Agree with Plan of Care Patient      Patient will benefit from skilled therapeutic intervention in order to improve the following deficits and impairments:  Abnormal gait, Pain, Improper body mechanics, Postural dysfunction, Difficulty walking, Decreased endurance, Decreased activity tolerance, Decreased strength, Increased edema  Visit Diagnosis: Chronic pain of right knee  Localized edema  Muscle weakness (generalized)     Problem List Patient Active Problem List   Diagnosis Date Noted  . 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

## 2016-07-11 ENCOUNTER — Ambulatory Visit: Payer: 59 | Admitting: Physical Therapy

## 2016-07-11 ENCOUNTER — Encounter: Payer: Self-pay | Admitting: Physical Therapy

## 2016-07-11 DIAGNOSIS — R600 Localized edema: Secondary | ICD-10-CM

## 2016-07-11 DIAGNOSIS — M25561 Pain in right knee: Principal | ICD-10-CM

## 2016-07-11 DIAGNOSIS — M6281 Muscle weakness (generalized): Secondary | ICD-10-CM

## 2016-07-11 DIAGNOSIS — G8929 Other chronic pain: Secondary | ICD-10-CM | POA: Diagnosis not present

## 2016-07-11 NOTE — Therapy (Signed)
[place], [place], [address] Phone: [phone]   Fax:  [phone]  Physical Therapy Treatment / Discharge summary  Patient Details  Name: [name] MRN: [id number] Date of Birth: [birth year] Referring Provider: Carole Civil, MD  Encounter Date: 07/11/2016      PT End of Session - 07/11/16 0820    Visit Number 9   Number of Visits 13   Date for PT Re-Evaluation 07/18/16   PT Start Time 0801   PT Stop Time 0830  shortened visit due to discharged   PT Time Calculation (min) 29 min   Activity Tolerance Patient tolerated treatment well   Behavior During Therapy  for tasks assessed/performed      Past Medical History:  Diagnosis Date  . Arthritis   . Asthma   . Diabetes mellitus without complication ([place])   . Hypertension   . Osteoporosis   . Thyroid disease     Past Surgical History:  Procedure Laterality Date  . extra digit removed      There were no vitals filed for this visit.      Subjective Assessment - 07/11/16 0801    Subjective "I am feeling more pain in the knee and soreness in the feet and ankle" pt reported no changes since last session   Currently in Pain? Yes   Pain Score 10-Worst pain ever   Pain Location Knee   Pain Orientation Right   Pain Type Chronic pain   Aggravating Factors  walking, standing   Pain Relieving Factors ice            [facility] PT Assessment - 07/11/16 0812      AROM   Right Knee Extension 0   Right Knee Flexion 96     Strength   Right Knee Flexion 4-/5   Right Knee Extension 4-/5                     [facility] Adult PT Treatment/Exercise - 07/11/16 0001      Modalities   Modalities Cryotherapy     Cryotherapy   Number Minutes Cryotherapy 10 Minutes   Cryotherapy Location Knee   Type of Cryotherapy Ice pack                PT Education - 07/11/16 0818    Education Details benefits of continued  strengthening for the knee to maitain current level, reviewed HEP and how to progress as able with increased reps/ sets. beneftis of conintued RICE and using muscle activation for pumping effect.    Person(s) Educated Patient   Methods Explanation;Verbal cues   Comprehension Verbalized understanding          PT Short Term Goals - 07/11/16 0813      PT SHORT TERM GOAL #1   Title pt will be I with inital HEP (06/27/2016)   Time 3   Period Weeks   Status Partially Met     PT SHORT TERM GOAL #2   Title she will be able to verbalize / demo proper lifting and walking as well as proper mechanics with navigating steps to prevent and reduce R knee pain (06/27/2016)   Baseline unable to assess due to pain   Time 3   Period Weeks   Status Unable to assess     PT SHORT TERM GOAL #3   Title she will increase R hip abductor/ extensor strength to >/= 4/5 to promote stability at  the knee with walking/ standing activities (06/27/2016)   Time 3   Period Weeks   Status Partially Met           PT Long Term Goals - 07/11/16 0813      PT LONG TERM GOAL #1   Title pt will be I with all HEP given as of last visit (07/18/2016)   Time 6   Period Weeks   Status Partially Met     PT LONG TERM GOAL #2   Title increase R hip/ knee strength to >/= 4+/5 with </= 1/10 pain during testing to support the joint with CKC activities (07/18/2016)   Time 6   Period Weeks   Status Not Met     PT LONG TERM GOAL #3   Title she will be able to walk/ stand >/= 45 min and navigate >/= 20 steps with </= 1/10 pain for functional endurance required for work and ADLs (07/18/2016)   Period Weeks   Status Not Met     PT LONG TERM GOAL #4   Title increase FOTO score to </= 39% limited to demo improvement in function (07/18/2016)   Time 6   Period Weeks   Status Not Met     PT LONG TERM GOAL #5   Title pt will be able to return to personal exercise routine with </= 1/10 pain per pt's personal goal and promote  maintenance of R knee (07/18/2016)   Time 6   Period Weeks   Status Not Met               Plan - 07/11/16 0539    Clinical Impression Statement pt continues to demonstrate significant pain in the R knee rated at 10/10 today but declined going to he ED. she has demonstrated regression in ROM and strength compared to previous measures with pain during assessment. limited functional progression and lack of carryover from session to session over the course of 9 visits. she would benefit going back to Md for further assessment, PT will be discharged from PT today.    PT Next Visit Plan D/C   PT Home Exercise Plan SAQ with ball squeeze, sidelying hip abduction. heel raise, bridges   Consulted and Agree with Plan of Care Patient      Patient will benefit from skilled therapeutic intervention in order to improve the following deficits and impairments:  Abnormal gait, Pain, Improper body mechanics, Postural dysfunction, Difficulty walking, Decreased endurance, Decreased activity tolerance, Decreased strength, Increased edema  Visit Diagnosis: Chronic pain of right knee  Localized edema  Muscle weakness (generalized)     Problem List Patient Active Problem List   Diagnosis Date Noted  . Cervical high risk HPV (human papillomavirus) test positive 05/03/2013   Starr Lake PT, DPT, LAT, ATC  07/11/16  8:34 AM      [facility] [address] [place], [place], [address] Phone: [phone]   Fax:  [phone]  Name: [name] MRN: [id number] Date of Birth: 03-Dec-1980      PHYSICAL THERAPY DISCHARGE SUMMARY  Visits from Start of Care: 9  Current functional level related to goals / functional outcomes: Goals FOTO 77% limited   Remaining deficits: R knee pain, limited ROM, weakness in knee/ hip. R foot/ arch pain with pes planus. Antalgic gait pattern with trendelenberg pattern.    Education /  Equipment: HEP, anatomy of area involved, RICE,   Plan: Patient agrees to discharge.  Patient goals were  partially met. Patient is being discharged due to lack of progress.  ?????    Taila Basinski PT, DPT, LAT, ATC  07/11/16  8:36 AM

## 2016-07-13 ENCOUNTER — Encounter: Payer: Self-pay | Admitting: Orthopedic Surgery

## 2016-07-13 ENCOUNTER — Ambulatory Visit (INDEPENDENT_AMBULATORY_CARE_PROVIDER_SITE_OTHER): Payer: 59 | Admitting: Orthopedic Surgery

## 2016-07-13 VITALS — Temp 97.8°F | Ht 64.0 in | Wt 280.0 lb

## 2016-07-13 DIAGNOSIS — M25561 Pain in right knee: Secondary | ICD-10-CM

## 2016-07-13 DIAGNOSIS — M222X2 Patellofemoral disorders, left knee: Secondary | ICD-10-CM

## 2016-07-13 DIAGNOSIS — M23203 Derangement of unspecified medial meniscus due to old tear or injury, right knee: Secondary | ICD-10-CM

## 2016-07-13 DIAGNOSIS — M222X1 Patellofemoral disorders, right knee: Secondary | ICD-10-CM | POA: Diagnosis not present

## 2016-07-13 NOTE — Progress Notes (Signed)
Patient ID: [name], female   DOB: 12/31/1980, 36 y.o.   MRN: [id number]  Chief Complaint  Patient presents with  . 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 Hilda LiasKeeling with several month history of right knee pain no trauma. She has pain along the anterior part of her knee in a U-shaped fashion associated with popping clicking giving way. It is mainly a dull pain not associated with swelling or loss of motion  She comes back saying her knee is not any better after physical therapy  Review of Systems  Constitutional: Negative for chills and fever.  Musculoskeletal: Positive for joint pain.  Neurological: Negative for tingling and focal weakness.   Past Medical History:  Diagnosis Date  . Arthritis   . Asthma   . Diabetes mellitus without complication ([facility])   . Hypertension   . Osteoporosis   . Thyroid disease    Past Surgical History:  Procedure Laterality Date  . extra digit removed     Family History  Problem Relation Age of Onset  . Hypothyroidism Mother   . Hypertension Other       BP (!) 135/93   Pulse 68   Wt 272 lb (123.4 kg)   BMI 46.69 kg/m  Gen. appearance is normal grooming and hygiene Orientation to person place and time normal Mood normal Gait is normal  No peripheral edema or swelling is noted in the Right or left leg Sensory exam shows normal sensation to palpation, pressure and soft touch both legs Skin exam no lacerations ulcerations or erythema both knees  Right Knee Exam   Tenderness  The patient is experiencing tenderness in the patella, lateral retinaculum, medial retinaculum, medial joint line and lateral joint line.  Range of Motion  Extension: normal  Flexion: normal Right knee flexion: 125.   Muscle Strength   The patient has normal right knee strength.  Tests  McMurray:  Medial - negative Lateral - negative Drawer:        Anterior - negative    Posterior - negative Varus: negative Valgus: negative Patellar Apprehension: positive  Other  Erythema: absent Scars: absent Sensation: normal Pulse: present Swelling: none  Comments:  The right knee is obviously more symptomatic than the left and the peripatellar structures are tender to palpation with crepitance on range of motion and positive grind test bilaterally   Left Knee Exam   Tenderness  The patient is experiencing tenderness in the lateral joint line, medial joint line, patellar tendon, patella, medial retinaculum and lateral retinaculum.  Range of Motion  Extension: normal  Flexion: normal Left knee flexion: 125.   Muscle Strength   The patient has normal left knee strength.  Tests  McMurray:  Medial - negative Lateral - negative Drawer:       Anterior - negative     Posterior - negative Varus: negative Valgus: negative Patellar Apprehension: positive  Other  Erythema: absent Scars: absent Sensation: normal Pulse: present Swelling: none     A/P  Medical decision-making MRI was obtained with the following results IMPRESSION: 1. Tiny radial tear of the free edge of the body of the medial meniscus. 2. No focal chondral defect of the right knee.     Electronically Signed   By: Elige KoHetal  Patel   On: 05/29/2016 08:36    Encounter Diagnoses  Name Primary?  . Right knee pain, unspecified chronicity  Yes  . Patellofemoral pain syndrome of both knees   . Other old tear of medial meniscus of right knee    She is agreeable to arthroscopy of the right knee partial medial meniscectomy and meniscal tears found. She has a lot of patellofemoral pain and understands that this will continue to give her some discomfort. Fuller Canada, MD 06/01/2016 8:36 AMFollow up visit     See remaining dictation         Review of Systems  Constitutional: Negative for chills, fever and weight loss.  Respiratory: Negative for shortness  of breath.   Cardiovascular: Negative for chest pain.  Neurological: Negative for tingling.

## 2016-07-13 NOTE — Patient Instructions (Signed)
You have decided to proceed with operative arthroscopy of the knee. You have decided not to continue with nonoperative measures such as but not limited to oral medication, weight loss, activity modification, physical therapy, bracing, or injection.  We will perform operative arthroscopy of the knee. Some of the risks associated with arthroscopic surgery of the knee include but are not limited to Bleeding Infection Swelling Stiffness Blood clot Pain  If you're not comfortable with these risks and would like to continue with nonoperative treatment please let Dr. Nimra Puccinelli know prior to your surgery. 

## 2016-07-17 NOTE — Patient Instructions (Signed)
[name]  07/17/2016     @PREFPERIOPPHARMACY @   Your procedure is scheduled on  08/02/2016   Report to [facility] at  720  A.M.  Call this number if you have problems the morning of surgery:  [phone]   Remember:  Do not eat food or drink liquids after midnight.  Take these medicines the morning of surgery with A SIP OF WATER  Coreg, levothyroxine, lisinopril. Use your inhalers before you come.   Do not wear jewelry, make-up or nail polish.  Do not wear lotions, powders, or perfumes, or deoderant.  Do not shave 48 hours prior to surgery.  Men may shave face and neck.  Do not bring valuables to the hospital.  [facility] is not responsible for any belongings or valuables.  Contacts, dentures or bridgework may not be worn into surgery.  Leave your suitcase in the car.  After surgery it may be brought to your room.  For patients admitted to the hospital, discharge time will be determined by your treatment team.  Patients discharged the day of surgery will not be allowed to drive home.   Name and phone number of your driver:  family Special instructions:  None  Please read over the following fact sheets that you were given. Anesthesia Post-op Instructions and Care and Recovery After Surgery      Knee Ligament Injury, Arthroscopy Arthroscopy is a surgical technique in which your health care provider examines your knee through a small, pencil-sized telescope (arthroscope). Often, repairs to injured ligaments can be done with instruments in the arthroscope. Arthroscopy is less invasive than open-knee surgery. Tell a health care provider about:  Any allergies you have.  All medicines you are taking, including vitamins, herbs, eye drops, creams, and over-the-counter medicines.  Any problems you or family members have had with anesthetic medicines.  Any blood disorders you have.  Any surgeries you have had.  Any medical conditions you have. What  are the risks? Generally, this is a safe procedure. However, as with any procedure, problems can occur. Possible problems include:  Infection.  Bleeding.  Stiffness.  What happens before the procedure?  Ask your health care provider about changing or stopping any regular medicines. Avoid taking aspirin or blood thinners as directed by your health care provider.  Do not eat or drink anything after midnight the night before surgery.  If you smoke, do not smoke for at least 2 weeks before your surgery.  Do not drink alcohol starting the day before your surgery.  Let your health care provider know if you develop a cold or any infection before your surgery.  Arrange for someone to drive you home after the surgery or after your hospital stay. Also arrange for someone to help you with activities during recovery. What happens during the procedure?  Small monitors will be put on your body. They are used to check your heart, blood pressure, and oxygen levels.  An IV access tube will be put into one of your veins. Medicine will be able to flow directly into your body through this IV tube.  You might be given a medicine to help you relax (sedative).  You will be given a medicine that makes you go to sleep (general anesthetic), and a breathing tube will be placed into your lungs during the procedure.  Several small incisions are made in your knee. Saline fluid is placed into one of the incisions to  expand the knee and clear away any blood in the knee.  Your health care provider will insert the arthroscope to examine the injured knee.  During arthroscopy, your health care provider may find a partial or complete tear in a ligament.  Tools can be inserted through the other incisions to repair the injured ligaments.  The incisions are then closed with absorbable stitches and covered with dressings. What happens after the procedure?  You will be taken to the recovery area where you will be  monitored.  When you are awake, stable, and taking fluids without problems, you will be allowed to go home. This information is not intended to replace advice given to you by your health care provider. Make sure you discuss any questions you have with your health care provider. Document Released: 12/30/1999 Document Revised: 06/09/2015 Document Reviewed: 08/13/2012 Elsevier Interactive Patient Education  2017 [employer]  Arthroscopic Knee Ligament Repair, Care After This sheet gives you information about how to care for yourself after your procedure. Your health care provider may also give you more specific instructions. If you have problems or questions, contact your health care provider. What can I expect after the procedure? After the procedure, it is common to have:  Pain in your knee.  Bruising and swelling on your knee, calf, and ankle for 3-4 days.  Fatigue.  Follow these instructions at home: If you have a brace or immobilizer:  Wear the brace or immobilizer as told by your health care provider. Remove it only as told by your health care provider.  Loosen the splint or immobilizer if your toes tingle, become numb, or turn cold and blue.  Keep the brace or immobilizer clean. Bathing  Do not take baths, swim, or use a hot tub until your health care provider approves. Ask your health care provider if you can take showers.  Keep your bandage (dressing) dry until your health care provider says that it can be removed. Cover it and your brace or immobilizer with a watertight covering when you take a shower. Incision care  Follow instructions from your health care provider about how to take care of your incision. Make sure you: ? Wash your hands with soap and water before you change your bandage (dressing). If soap and water are not available, use hand sanitizer. ? Change your dressing as told by your health care provider. ? Leave stitches (sutures), skin glue, or adhesive  strips in place. These skin closures may need to stay in place for 2 weeks or longer. If adhesive strip edges start to loosen and curl up, you may trim the loose edges. Do not remove adhesive strips completely unless your health care provider tells you to do that.  Check your incision area every day for signs of infection. Check for: ? More redness, swelling, or pain. ? More fluid or blood. ? Warmth. ? Pus or a bad smell. Managing pain, stiffness, and swelling  If directed, put ice on the affected area. ? If you have a removable brace or immobilizer, remove it as told by your health care provider. ? Put ice in a plastic bag. ? Place a towel between your skin and the bag or between your brace or immobilizer and the bag. ? Leave the ice on for 20 minutes, 2-3 times a day.  Move your toes often to avoid stiffness and to lessen swelling.  Raise (elevate) the injured area above the level of your heart while you are sitting or lying down. Driving  Do not drive until your health care provider approves. If you have a brace or immobilizer on your leg, ask your health care provider when it is safe for you to drive.  Do not drive or use heavy machinery while taking prescription pain medicine. Activity  Rest as directed. Ask your health care provider what activities are safe for you.  Do physical therapy exercises as told by your health care provider. Physical therapy will help you regain strength and motion in your knee.  Follow instructions from your health care provider about: ? When you may start motion exercises. ? When you may start riding a stationary bike and doing other low-impact activities. ? When you may start to jog and do other high-impact activities. Safety  Do not use the injured limb to support your body weight until your health care provider says that you can. Use crutches as told by your health care provider. General instructions  Do not use any products that contain  nicotine or tobacco, such as cigarettes and e-cigarettes. These can delay bone healing. If you need help quitting, ask your health care provider.  To prevent or treat constipation while you are taking prescription pain medicine, your health care provider may recommend that you: ? Drink enough fluid to keep your urine clear or pale yellow. ? Take over-the-counter or prescription medicines. ? Eat foods that are high in fiber, such as fresh fruits and vegetables, whole grains, and beans. ? Limit foods that are high in fat and processed sugars, such as fried and sweet foods.  Take over-the-counter and prescription medicines only as told by your health care provider.  Keep all follow-up visits as told by your health care provider. This is important. Contact a health care provider if:  You have more redness, swelling, or pain around an incision.  You have more fluid or blood coming from an incision.  Your incision feels warm to the touch.  You have a fever.  You have pain or swelling in your knee, and it gets worse.  You have pain that does not get better with medicine. Get help right away if:  You have trouble breathing.  You have pus or a bad smell coming from an incision.  You have numbness and tingling near the knee joint. Summary  After the procedure, it is common to have knee pain with bruising and swelling on your knee, calf, and ankle.  Icing your knee and raising your leg above the level of your heart will help control the pain and the swelling.  Do physical therapy exercises as told by your health care provider. Physical therapy will help you regain strength and motion in your knee. This information is not intended to replace advice given to you by your health care provider. Make sure you discuss any questions you have with your health care provider. Document Released: 10/22/2012 Document Revised: 12/27/2015 Document Reviewed: 12/27/2015 Elsevier Interactive Patient  Education  2017 [place] Anesthesia, Adult General anesthesia is the use of medicines to make a person "go to sleep" (be unconscious) for a medical procedure. General anesthesia is often recommended when a procedure:  Is long.  Requires you to be still or in an unusual position.  Is major and can cause you to lose blood.  Is impossible to do without general anesthesia.  The medicines used for general anesthesia are called general anesthetics. In addition to making you sleep, the medicines:  Prevent pain.  Control your blood pressure.  Relax  your muscles.  Tell a health care provider about:  Any allergies you have.  All medicines you are taking, including vitamins, herbs, eye drops, creams, and over-the-counter medicines.  Any problems you or family members have had with anesthetic medicines.  Types of anesthetics you have had in the past.  Any bleeding disorders you have.  Any surgeries you have had.  Any medical conditions you have.  Any history of heart or lung conditions, such as heart failure, sleep apnea, or chronic obstructive pulmonary disease (COPD).  Whether you are pregnant or may be pregnant.  Whether you use tobacco, alcohol, marijuana, or street drugs.  Any history of Armed forces logistics/support/administrative officer.  Any history of depression or anxiety. What are the risks? Generally, this is a safe procedure. However, problems may occur, including:  Allergic reaction to anesthetics.  Lung and heart problems.  Inhaling food or liquids from your stomach into your lungs (aspiration).  Injury to nerves.  Waking up during your procedure and being unable to move (rare).  Extreme agitation or a state of mental confusion (delirium) when you wake up from the anesthetic.  Air in the bloodstream, which can lead to stroke.  These problems are more likely to develop if you are having a major surgery or if you have an advanced medical condition. You can prevent some of  these complications by answering all of your health care provider's questions thoroughly and by following all pre-procedure instructions. General anesthesia can cause side effects, including:  Nausea or vomiting  A sore throat from the breathing tube.  Feeling cold or shivery.  Feeling tired, washed out, or achy.  Sleepiness or drowsiness.  Confusion or agitation.  What happens before the procedure? Staying hydrated Follow instructions from your health care provider about hydration, which may include:  Up to 2 hours before the procedure - you may continue to drink clear liquids, such as water, clear fruit juice, black coffee, and plain tea.  Eating and drinking restrictions Follow instructions from your health care provider about eating and drinking, which may include:  8 hours before the procedure - stop eating heavy meals or foods such as meat, fried foods, or fatty foods.  6 hours before the procedure - stop eating light meals or foods, such as toast or cereal.  6 hours before the procedure - stop drinking milk or drinks that contain milk.  2 hours before the procedure - stop drinking clear liquids.  Medicines  Ask your health care provider about: ? Changing or stopping your regular medicines. This is especially important if you are taking diabetes medicines or blood thinners. ? Taking medicines such as aspirin and ibuprofen. These medicines can thin your blood. Do not take these medicines before your procedure if your health care provider instructs you not to. ? Taking new dietary supplements or medicines. Do not take these during the week before your procedure unless your health care provider approves them.  If you are told to take a medicine or to continue taking a medicine on the day of the procedure, take the medicine with sips of water. General instructions   Ask if you will be going home the same day, the following day, or after a longer hospital stay. ? Plan to  have someone take you home. ? Plan to have someone stay with you for the first 24 hours after you leave the hospital or clinic.  For 3-6 weeks before the procedure, try not to use any tobacco products, such as cigarettes, chewing  tobacco, and e-cigarettes.  You may brush your teeth on the morning of the procedure, but make sure to spit out the toothpaste. What happens during the procedure?  You will be given anesthetics through a mask and through an IV tube in one of your veins.  You may receive medicine to help you relax (sedative).  As soon as you are asleep, a breathing tube may be used to help you breathe.  An anesthesia specialist will stay with you throughout the procedure. He or she will help keep you comfortable and safe by continuing to give you medicines and adjusting the amount of medicine that you get. He or she will also watch your blood pressure, pulse, and oxygen levels to make sure that the anesthetics do not cause any problems.  If a breathing tube was used to help you breathe, it will be removed before you wake up. The procedure may vary among health care providers and hospitals. What happens after the procedure?  You will wake up, often slowly, after the procedure is complete, usually in a recovery area.  Your blood pressure, heart rate, breathing rate, and blood oxygen level will be monitored until the medicines you were given have worn off.  You may be given medicine to help you calm down if you feel anxious or agitated.  If you will be going home the same day, your health care provider may check to make sure you can stand, drink, and urinate.  Your health care providers will treat your pain and side effects before you go home.  Do not drive for 24 hours if you received a sedative.  You may: ? Feel nauseous and vomit. ? Have a sore throat. ? Have mental slowness. ? Feel cold or shivery. ? Feel sleepy. ? Feel tired. ? Feel sore or achy, even in parts of your  body where you did not have surgery. This information is not intended to replace advice given to you by your health care provider. Make sure you discuss any questions you have with your health care provider. Document Released: 04/10/2007 Document Revised: 06/14/2015 Document Reviewed: 12/16/2014 Elsevier Interactive Patient Education  2018 [place] Anesthesia, Adult, Care After These instructions provide you with information about caring for yourself after your procedure. Your health care provider may also give you more specific instructions. Your treatment has been planned according to current medical practices, but problems sometimes occur. Call your health care provider if you have any problems or questions after your procedure. What can I expect after the procedure? After the procedure, it is common to have:  Vomiting.  A sore throat.  Mental slowness.  It is common to feel:  Nauseous.  Cold or shivery.  Sleepy.  Tired.  Sore or achy, even in parts of your body where you did not have surgery.  Follow these instructions at home: For at least 24 hours after the procedure:  Do not: ? Participate in activities where you could fall or become injured. ? Drive. ? Use heavy machinery. ? Drink alcohol. ? Take sleeping pills or medicines that cause drowsiness. ? Make important decisions or sign legal documents. ? Take care of children on your own.  Rest. Eating and drinking  If you vomit, drink water, juice, or soup when you can drink without vomiting.  Drink enough fluid to keep your urine clear or pale yellow.  Make sure you have little or no nausea before eating solid foods.  Follow the diet recommended by your health  care provider. General instructions  Have a responsible adult stay with you until you are awake and alert.  Return to your normal activities as told by your health care provider. Ask your health care provider what activities are safe for  you.  Take over-the-counter and prescription medicines only as told by your health care provider.  If you smoke, do not smoke without supervision.  Keep all follow-up visits as told by your health care provider. This is important. Contact a health care provider if:  You continue to have nausea or vomiting at home, and medicines are not helpful.  You cannot drink fluids or start eating again.  You cannot urinate after 8-12 hours.  You develop a skin rash.  You have fever.  You have increasing redness at the site of your procedure. Get help right away if:  You have difficulty breathing.  You have chest pain.  You have unexpected bleeding.  You feel that you are having a life-threatening or urgent problem. This information is not intended to replace advice given to you by your health care provider. Make sure you discuss any questions you have with your health care provider. Document Released: 04/09/2000 Document Revised: 06/06/2015 Document Reviewed: 12/16/2014 Elsevier Interactive Patient Education  [employer].

## 2016-07-30 ENCOUNTER — Encounter (HOSPITAL_COMMUNITY): Payer: Self-pay

## 2016-07-30 ENCOUNTER — Encounter (HOSPITAL_COMMUNITY)
Admission: RE | Admit: 2016-07-30 | Discharge: 2016-07-30 | Disposition: A | Discharge status: Home / Self Care | Payer: 59 | Source: Ambulatory Visit | Attending: Orthopedic Surgery | Admitting: Orthopedic Surgery

## 2016-07-30 DIAGNOSIS — Z8249 Family history of ischemic heart disease and other diseases of the circulatory system: Secondary | ICD-10-CM | POA: Diagnosis not present

## 2016-07-30 DIAGNOSIS — J45909 Unspecified asthma, uncomplicated: Secondary | ICD-10-CM | POA: Diagnosis not present

## 2016-07-30 DIAGNOSIS — M25862 Other specified joint disorders, left knee: Secondary | ICD-10-CM | POA: Diagnosis not present

## 2016-07-30 DIAGNOSIS — M1711 Unilateral primary osteoarthritis, right knee: Secondary | ICD-10-CM | POA: Diagnosis not present

## 2016-07-30 DIAGNOSIS — E079 Disorder of thyroid, unspecified: Secondary | ICD-10-CM | POA: Diagnosis not present

## 2016-07-30 DIAGNOSIS — M23303 Other meniscus derangements, unspecified medial meniscus, right knee: Secondary | ICD-10-CM | POA: Diagnosis present

## 2016-07-30 DIAGNOSIS — M810 Age-related osteoporosis without current pathological fracture: Secondary | ICD-10-CM | POA: Diagnosis not present

## 2016-07-30 DIAGNOSIS — Z7951 Long term (current) use of inhaled steroids: Secondary | ICD-10-CM | POA: Diagnosis not present

## 2016-07-30 DIAGNOSIS — I10 Essential (primary) hypertension: Secondary | ICD-10-CM | POA: Diagnosis not present

## 2016-07-30 DIAGNOSIS — E119 Type 2 diabetes mellitus without complications: Secondary | ICD-10-CM | POA: Diagnosis not present

## 2016-07-30 DIAGNOSIS — M2241 Chondromalacia patellae, right knee: Secondary | ICD-10-CM | POA: Diagnosis not present

## 2016-07-30 DIAGNOSIS — Z8349 Family history of other endocrine, nutritional and metabolic diseases: Secondary | ICD-10-CM | POA: Diagnosis not present

## 2016-07-30 DIAGNOSIS — Z79899 Other long term (current) drug therapy: Secondary | ICD-10-CM | POA: Diagnosis not present

## 2016-07-30 HISTORY — DX: Headache, unspecified: R51.9

## 2016-07-30 HISTORY — DX: Hypothyroidism, unspecified: E03.9

## 2016-07-30 HISTORY — DX: Headache: R51

## 2016-07-30 LAB — BASIC METABOLIC PANEL
Anion gap: 10 (ref 5–15)
BUN: 10 mg/dL (ref 6–20)
CO2: 26 mmol/L (ref 22–32)
Calcium: 9.5 mg/dL (ref 8.9–10.3)
Chloride: 103 mmol/L (ref 101–111)
Creatinine, Ser: 0.75 mg/dL (ref 0.44–1.00)
GFR calc Af Amer: >60 mL/min (ref >60)
GFR calc non Af Amer: >60 mL/min (ref >60)
Glucose, Bld: 119 mg/dL — ABNORMAL HIGH (ref 65–99)
Potassium: 3.2 mmol/L — ABNORMAL LOW (ref 3.5–5.1)
Sodium: 139 mmol/L (ref 135–145)

## 2016-07-30 LAB — CBC
HCT: 37.7 % (ref 36.0–46.0)
Hemoglobin: 11.9 g/dL — ABNORMAL LOW (ref 12.0–15.0)
MCH: 23 pg — ABNORMAL LOW (ref 26.0–34.0)
MCHC: 31.6 g/dL (ref 30.0–36.0)
MCV: 72.8 fL — ABNORMAL LOW (ref 78.0–100.0)
Platelets: 255 10*3/uL (ref 150–400)
RBC: 5.18 MIL/uL — ABNORMAL HIGH (ref 3.87–5.11)
RDW: 16.7 % — ABNORMAL HIGH (ref 11.5–15.5)
WBC: 10.3 10*3/uL (ref 4.0–10.5)

## 2016-07-30 LAB — SURGICAL PCR SCREEN
MRSA, PCR: NEGATIVE
Staphylococcus aureus: NEGATIVE

## 2016-07-30 LAB — HCG, SERUM, QUALITATIVE: Preg, Serum: NEGATIVE

## 2016-07-30 NOTE — Progress Notes (Signed)
   07/30/16 1250  OBSTRUCTIVE SLEEP APNEA  Have you ever been diagnosed with sleep apnea through a sleep study? No  Do you snore loudly (loud enough to be heard through closed doors)?  1  Do you often feel tired, fatigued, or sleepy during the daytime (such as falling asleep during driving or talking to someone)? 1  Has anyone observed you stop breathing during your sleep? 1  Do you have, or are you being treated for high blood pressure? 1  BMI more than 35 kg/m2? 1  Age > 50 (1-yes) 0  Neck circumference greater than:Female 16 inches or larger, Female 17inches or larger? 0  Female Gender (Yes=1) 0  Obstructive Sleep Apnea Score 5  Score 5 or greater  Results sent to PCP

## 2016-07-31 ENCOUNTER — Other Ambulatory Visit: Payer: Self-pay | Admitting: *Deleted

## 2016-07-31 ENCOUNTER — Telehealth: Payer: Self-pay | Admitting: *Deleted

## 2016-07-31 MED ORDER — POTASSIUM CHLORIDE CRYS ER 20 MEQ PO TBCR: 20.0000 meq | EXTENDED_RELEASE_TABLET | Freq: Three times a day (TID) | ORAL | 0 refills | Status: DC

## 2016-07-31 NOTE — Progress Notes (Signed)
Medication sent, called patient, no answer, left vm

## 2016-07-31 NOTE — Pre-Procedure Instructions (Signed)
Dr Jayme CloudGonzalez aware of K+ of 3.2. No orders given.

## 2016-07-31 NOTE — Telephone Encounter (Signed)
Patient advised to pick up potassium at pharmacy [facility] [address]  and start taking it today.

## 2016-08-01 NOTE — H&P (Signed)
Chief Complaint  Patient presents with  . 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 Hilda LiasKeeling with several month history of right knee pain no trauma. She has pain along the anterior part of her knee in a U-shaped fashion associated with popping clicking giving way. It is mainly a dull pain not associated with swelling or loss of motion She comes back saying her knee is not any better after physical therapy   Review of Systems  Constitutional: Negative for chills and fever.  Musculoskeletal: Positive for joint pain.  Neurological: Negative for tingling and focal weakness.        Past Medical History:  Diagnosis Date  . Arthritis    . Asthma    . Diabetes mellitus without complication (HCC)    . Hypertension    . Osteoporosis    . Thyroid disease           Past Surgical History:  Procedure Laterality Date  . extra digit removed             Family History  Problem Relation Age of Onset  . Hypothyroidism Mother    . Hypertension Other            BP (!) 135/93   Pulse 68   Wt 272 lb (123.4 kg)   BMI 46.69 kg/m  Gen. appearance is normal grooming and hygiene Orientation to person place and time normal Mood normal Gait is normal  No peripheral edema or swelling is noted in the Right or left leg Sensory exam shows normal sensation to palpation, pressure and soft touch both legs Skin exam no lacerations ulcerations or erythema both knees   Right Knee Exam    Tenderness  The patient is experiencing tenderness in the patella, lateral retinaculum, medial retinaculum, medial joint line and lateral joint line.   Range of Motion  Extension: normal  Flexion: normal Right knee flexion: 125.    Muscle Strength    The patient has normal right knee strength.   Tests  McMurray:  Medial - negative Lateral - negative Drawer:       Anterior - negative    Posterior -  negative Varus: negative Valgus: negative Patellar Apprehension: positive   Other  Erythema: absent Scars: absent Sensation: normal Pulse: present Swelling: none   Comments:  The right knee is obviously more symptomatic than the left and the peripatellar structures are tender to palpation with crepitance on range of motion and positive grind test bilaterally     Left Knee Exam    Tenderness  The patient is experiencing tenderness in the lateral joint line, medial joint line, patellar tendon, patella, medial retinaculum and lateral retinaculum.   Range of Motion  Extension: normal  Flexion: normal Left knee flexion: 125.    Muscle Strength    The patient has normal left knee strength.   Tests  McMurray:  Medial - negative Lateral - negative Drawer:       Anterior - negative     Posterior - negative Varus: negative Valgus: negative Patellar Apprehension: positive   Other  Erythema: absent Scars: absent Sensation: normal Pulse: present Swelling: none       A/P  Medical decision-making MRI was obtained with the following results IMPRESSION: 1. Tiny radial tear of the free edge of the body of the medial meniscus. 2. No focal chondral defect of  the right knee.     Electronically Signed   By: Elige Ko   On: 05/29/2016 08:36           Encounter Diagnoses  Name Primary?  . Right knee pain, unspecified chronicity Yes  . Patellofemoral pain syndrome of both knees    . Other old tear of medial meniscus of right knee      She is agreeable to arthroscopy of the right knee partial medial meniscectomy. She has a lot of patellofemoral pain and understands that this will continue to give her some discomfort. Fuller Canada, MD

## 2016-08-02 ENCOUNTER — Ambulatory Visit (HOSPITAL_COMMUNITY)
Admission: RE | Admit: 2016-08-02 | Discharge: 2016-08-02 | Disposition: A | Discharge status: Home / Self Care | Payer: 59 | Source: Ambulatory Visit | Attending: Orthopedic Surgery | Admitting: Orthopedic Surgery

## 2016-08-02 ENCOUNTER — Ambulatory Visit (HOSPITAL_COMMUNITY): Payer: 59 | Admitting: Anesthesiology

## 2016-08-02 ENCOUNTER — Encounter (HOSPITAL_COMMUNITY)
Admission: RE | Disposition: A | Discharge status: Home / Self Care | Payer: Self-pay | Source: Ambulatory Visit | Attending: Orthopedic Surgery

## 2016-08-02 DIAGNOSIS — M2241 Chondromalacia patellae, right knee: Secondary | ICD-10-CM | POA: Diagnosis not present

## 2016-08-02 DIAGNOSIS — Z79899 Other long term (current) drug therapy: Secondary | ICD-10-CM | POA: Insufficient documentation

## 2016-08-02 DIAGNOSIS — M810 Age-related osteoporosis without current pathological fracture: Secondary | ICD-10-CM | POA: Diagnosis not present

## 2016-08-02 DIAGNOSIS — Z8249 Family history of ischemic heart disease and other diseases of the circulatory system: Secondary | ICD-10-CM | POA: Insufficient documentation

## 2016-08-02 DIAGNOSIS — J45909 Unspecified asthma, uncomplicated: Secondary | ICD-10-CM | POA: Insufficient documentation

## 2016-08-02 DIAGNOSIS — M25862 Other specified joint disorders, left knee: Secondary | ICD-10-CM | POA: Insufficient documentation

## 2016-08-02 DIAGNOSIS — E079 Disorder of thyroid, unspecified: Secondary | ICD-10-CM | POA: Insufficient documentation

## 2016-08-02 DIAGNOSIS — M1711 Unilateral primary osteoarthritis, right knee: Secondary | ICD-10-CM | POA: Insufficient documentation

## 2016-08-02 DIAGNOSIS — Z7951 Long term (current) use of inhaled steroids: Secondary | ICD-10-CM | POA: Diagnosis not present

## 2016-08-02 DIAGNOSIS — Z8349 Family history of other endocrine, nutritional and metabolic diseases: Secondary | ICD-10-CM | POA: Insufficient documentation

## 2016-08-02 DIAGNOSIS — S83241A Other tear of medial meniscus, current injury, right knee, initial encounter: Secondary | ICD-10-CM | POA: Diagnosis not present

## 2016-08-02 DIAGNOSIS — E119 Type 2 diabetes mellitus without complications: Secondary | ICD-10-CM | POA: Diagnosis not present

## 2016-08-02 DIAGNOSIS — I10 Essential (primary) hypertension: Secondary | ICD-10-CM | POA: Insufficient documentation

## 2016-08-02 DIAGNOSIS — Z9889 Other specified postprocedural states: Secondary | ICD-10-CM | POA: Diagnosis not present

## 2016-08-02 HISTORY — PX: KNEE ARTHROSCOPY WITH MEDIAL MENISECTOMY: SHX5651

## 2016-08-02 LAB — GLUCOSE, CAPILLARY
Glucose-Capillary: 90 mg/dL (ref 65–99)
Glucose-Capillary: 114 mg/dL — ABNORMAL HIGH (ref 65–99)

## 2016-08-02 SURGERY — ARTHROSCOPY, KNEE, WITH MEDIAL MENISCECTOMY
Anesthesia: General | Site: Knee | Laterality: Right

## 2016-08-02 MED ORDER — LIDOCAINE HCL (PF) 1 % IJ SOLN
INTRAMUSCULAR | Status: AC
  Filled 2016-08-02: qty 5

## 2016-08-02 MED ORDER — ROCURONIUM BROMIDE 100 MG/10ML IV SOLN
INTRAVENOUS | Status: DC | PRN
  Administered 2016-08-02 (×2): 5 mg via INTRAVENOUS
  Administered 2016-08-02: 20 mg via INTRAVENOUS

## 2016-08-02 MED ORDER — BUPIVACAINE-EPINEPHRINE (PF) 0.5% -1:200000 IJ SOLN
INTRAMUSCULAR | Status: AC
  Filled 2016-08-02: qty 60

## 2016-08-02 MED ORDER — PHENYLEPHRINE HCL 10 MG/ML IJ SOLN
INTRAMUSCULAR | Status: DC | PRN
  Administered 2016-08-02: 40 ug via INTRAVENOUS

## 2016-08-02 MED ORDER — NEOSTIGMINE METHYLSULFATE 10 MG/10ML IV SOLN
INTRAVENOUS | Status: AC
  Filled 2016-08-02: qty 1

## 2016-08-02 MED ORDER — NALOXONE HCL 0.4 MG/ML IJ SOLN
INTRAMUSCULAR | Status: AC
  Filled 2016-08-02: qty 1

## 2016-08-02 MED ORDER — NALOXONE HCL 0.4 MG/ML IJ SOLN
INTRAMUSCULAR | Status: DC | PRN
  Administered 2016-08-02: 0.1 mg via INTRAVENOUS

## 2016-08-02 MED ORDER — FENTANYL CITRATE (PF) 100 MCG/2ML IJ SOLN
INTRAMUSCULAR | Status: DC | PRN
  Administered 2016-08-02: 50 ug via INTRAVENOUS

## 2016-08-02 MED ORDER — GLYCOPYRROLATE 0.2 MG/ML IJ SOLN
INTRAMUSCULAR | Status: AC
  Filled 2016-08-02: qty 1

## 2016-08-02 MED ORDER — GLYCOPYRROLATE 0.2 MG/ML IJ SOLN
INTRAMUSCULAR | Status: DC | PRN
  Administered 2016-08-02 (×2): 0.2 mg via INTRAVENOUS
  Administered 2016-08-02: .8 mg via INTRAVENOUS

## 2016-08-02 MED ORDER — MIDAZOLAM HCL 2 MG/2ML IJ SOLN
1.0000 mg | INTRAMUSCULAR | Status: AC
  Administered 2016-08-02: 2 mg via INTRAVENOUS

## 2016-08-02 MED ORDER — FENTANYL CITRATE (PF) 100 MCG/2ML IJ SOLN: 25.0000 ug | INTRAMUSCULAR | Status: DC | PRN

## 2016-08-02 MED ORDER — PROPOFOL 10 MG/ML IV BOLUS
INTRAVENOUS | Status: DC | PRN
  Administered 2016-08-02: 20 mg via INTRAVENOUS
  Administered 2016-08-02: 30 mg via INTRAVENOUS
  Administered 2016-08-02: 150 mg via INTRAVENOUS

## 2016-08-02 MED ORDER — PHENYLEPHRINE 40 MCG/ML (10ML) SYRINGE FOR IV PUSH (FOR BLOOD PRESSURE SUPPORT)
PREFILLED_SYRINGE | INTRAVENOUS | Status: AC
  Filled 2016-08-02: qty 10

## 2016-08-02 MED ORDER — FENTANYL CITRATE (PF) 100 MCG/2ML IJ SOLN
INTRAMUSCULAR | Status: AC
  Filled 2016-08-02: qty 2

## 2016-08-02 MED ORDER — ONDANSETRON HCL 4 MG/2ML IJ SOLN
4.0000 mg | Freq: Once | INTRAMUSCULAR | Status: AC
  Administered 2016-08-02: 4 mg via INTRAVENOUS

## 2016-08-02 MED ORDER — GLYCOPYRROLATE 0.2 MG/ML IJ SOLN
INTRAMUSCULAR | Status: AC
  Filled 2016-08-02: qty 3

## 2016-08-02 MED ORDER — ONDANSETRON HCL 4 MG/2ML IJ SOLN
INTRAMUSCULAR | Status: AC
  Filled 2016-08-02: qty 2

## 2016-08-02 MED ORDER — HYDROCODONE-ACETAMINOPHEN 7.5-325 MG PO TABS: 1.0000 | ORAL_TABLET | ORAL | 0 refills | Status: DC | PRN

## 2016-08-02 MED ORDER — SUCCINYLCHOLINE CHLORIDE 20 MG/ML IJ SOLN
INTRAMUSCULAR | Status: AC
  Filled 2016-08-02: qty 1

## 2016-08-02 MED ORDER — MIDAZOLAM HCL 2 MG/2ML IJ SOLN
INTRAMUSCULAR | Status: AC
  Filled 2016-08-02: qty 2

## 2016-08-02 MED ORDER — CEFAZOLIN SODIUM-DEXTROSE 1-4 GM/50ML-% IV SOLN
1.0000 g | INTRAVENOUS | Status: AC
  Administered 2016-08-02: 1 g via INTRAVENOUS
  Filled 2016-08-02: qty 50

## 2016-08-02 MED ORDER — LACTATED RINGERS IV SOLN
INTRAVENOUS | Status: DC
  Administered 2016-08-02: 1000 mL via INTRAVENOUS

## 2016-08-02 MED ORDER — SODIUM CHLORIDE 0.9 % IR SOLN
Status: DC | PRN
  Administered 2016-08-02: 1000 mL

## 2016-08-02 MED ORDER — ALBUTEROL SULFATE HFA 108 (90 BASE) MCG/ACT IN AERS
INHALATION_SPRAY | RESPIRATORY_TRACT | Status: AC
  Filled 2016-08-02: qty 6.7

## 2016-08-02 MED ORDER — GLYCOPYRROLATE 0.2 MG/ML IJ SOLN
0.2000 mg | Freq: Once | INTRAMUSCULAR | Status: AC
  Administered 2016-08-02: 0.2 mg via INTRAVENOUS

## 2016-08-02 MED ORDER — CEFAZOLIN SODIUM-DEXTROSE 2-4 GM/100ML-% IV SOLN
2.0000 g | INTRAVENOUS | Status: AC
  Administered 2016-08-02: 2 g via INTRAVENOUS
  Filled 2016-08-02: qty 100

## 2016-08-02 MED ORDER — ROCURONIUM BROMIDE 50 MG/5ML IV SOLN
INTRAVENOUS | Status: AC
  Filled 2016-08-02: qty 1

## 2016-08-02 MED ORDER — EPINEPHRINE PF 1 MG/ML IJ SOLN
INTRAMUSCULAR | Status: DC | PRN
  Administered 2016-08-02 (×2): 3000 mL

## 2016-08-02 MED ORDER — GLYCOPYRROLATE 0.2 MG/ML IJ SOLN
INTRAMUSCULAR | Status: AC
  Filled 2016-08-02: qty 2

## 2016-08-02 MED ORDER — NEOSTIGMINE METHYLSULFATE 10 MG/10ML IV SOLN
INTRAVENOUS | Status: DC | PRN
  Administered 2016-08-02: 4 mg via INTRAVENOUS
  Administered 2016-08-02 (×2): 1 mg via INTRAVENOUS

## 2016-08-02 MED ORDER — LIDOCAINE HCL (CARDIAC) 10 MG/ML IV SOLN
INTRAVENOUS | Status: DC | PRN
  Administered 2016-08-02: 50 mg via INTRAVENOUS

## 2016-08-02 MED ORDER — DEXTROSE 5 % IV SOLN: 3.0000 g | INTRAVENOUS | Status: DC

## 2016-08-02 MED ORDER — SUCCINYLCHOLINE CHLORIDE 20 MG/ML IJ SOLN
INTRAMUSCULAR | Status: DC | PRN
  Administered 2016-08-02: 120 mg via INTRAVENOUS

## 2016-08-02 MED ORDER — ALBUTEROL SULFATE HFA 108 (90 BASE) MCG/ACT IN AERS
INHALATION_SPRAY | RESPIRATORY_TRACT | Status: DC | PRN
  Administered 2016-08-02: 3 via RESPIRATORY_TRACT

## 2016-08-02 SURGICAL SUPPLY — 50 items
ARTHROWAND PARAGON T2 (SURGICAL WAND)
BAG HAMPER (MISCELLANEOUS) ×2 IMPLANT
BANDAGE ELASTIC 6 LF NS (GAUZE/BANDAGES/DRESSINGS) ×2 IMPLANT
BANDAGE ELASTIC 6 VELCRO ST LF (GAUZE/BANDAGES/DRESSINGS) ×2 IMPLANT
BLADE AGGRESSIVE PLUS 4.0 (BLADE) ×2 IMPLANT
BLADE SURG SZ11 CARB STEEL (BLADE) ×2 IMPLANT
CHLORAPREP W/TINT 26ML (MISCELLANEOUS) ×2 IMPLANT
CLOTH BEACON ORANGE TIMEOUT ST (SAFETY) ×2 IMPLANT
COOLER CRYO IC GRAV AND TUBE (ORTHOPEDIC SUPPLIES) ×2 IMPLANT
CUFF CRYO KNEE LG 20X31 COOLER (ORTHOPEDIC SUPPLIES) ×2 IMPLANT
CUFF TOURNIQUET SINGLE 44IN (TOURNIQUET CUFF) ×2 IMPLANT
CUTTER ANGLED DBL BITE 4.5 (BURR) IMPLANT
DECANTER SPIKE VIAL GLASS SM (MISCELLANEOUS) ×4 IMPLANT
GAUZE SPONGE 4X4 12PLY STRL (GAUZE/BANDAGES/DRESSINGS) ×2 IMPLANT
GAUZE SPONGE 4X4 16PLY XRAY LF (GAUZE/BANDAGES/DRESSINGS) ×2 IMPLANT
GAUZE XEROFORM 5X9 LF (GAUZE/BANDAGES/DRESSINGS) ×2 IMPLANT
GLOVE BIOGEL PI IND STRL 7.0 (GLOVE) ×1 IMPLANT
GLOVE BIOGEL PI INDICATOR 7.0 (GLOVE) ×1
GLOVE SKINSENSE NS SZ8.0 LF (GLOVE) ×1
GLOVE SKINSENSE STRL SZ8.0 LF (GLOVE) ×1 IMPLANT
GLOVE SS N UNI LF 8.5 STRL (GLOVE) ×2 IMPLANT
GOWN STRL REUS W/ TWL LRG LVL3 (GOWN DISPOSABLE) ×1 IMPLANT
GOWN STRL REUS W/TWL LRG LVL3 (GOWN DISPOSABLE) ×1
GOWN STRL REUS W/TWL XL LVL3 (GOWN DISPOSABLE) ×2 IMPLANT
HLDR LEG FOAM (MISCELLANEOUS) ×1 IMPLANT
IV NS IRRIG 3000ML ARTHROMATIC (IV SOLUTION) ×4 IMPLANT
KIT BLADEGUARD II DBL (SET/KITS/TRAYS/PACK) ×2 IMPLANT
KIT ROOM TURNOVER AP CYSTO (KITS) ×2 IMPLANT
LEG HOLDER FOAM (MISCELLANEOUS) ×1
MANIFOLD NEPTUNE II (INSTRUMENTS) ×2 IMPLANT
MARKER SKIN DUAL TIP RULER LAB (MISCELLANEOUS) ×2 IMPLANT
NEEDLE HYPO 18GX1.5 BLUNT FILL (NEEDLE) ×2 IMPLANT
NEEDLE HYPO 21X1.5 SAFETY (NEEDLE) ×2 IMPLANT
NEEDLE SPNL 18GX3.5 QUINCKE PK (NEEDLE) ×2 IMPLANT
NS IRRIG 1000ML POUR BTL (IV SOLUTION) ×2 IMPLANT
PACK ARTHRO LIMB DRAPE STRL (MISCELLANEOUS) ×2 IMPLANT
PAD ABD 5X9 TENDERSORB (GAUZE/BANDAGES/DRESSINGS) ×2 IMPLANT
PAD ARMBOARD 7.5X6 YLW CONV (MISCELLANEOUS) ×2 IMPLANT
PADDING CAST COTTON 6X4 STRL (CAST SUPPLIES) ×2 IMPLANT
PADDING WEBRIL 6 STERILE (GAUZE/BANDAGES/DRESSINGS) ×2 IMPLANT
PROBE BIPOLAR 50 DEGREE SUCT (MISCELLANEOUS) IMPLANT
PROBE BIPOLAR ATHRO 135MM 90D (MISCELLANEOUS) ×2 IMPLANT
SET ARTHROSCOPY INST (INSTRUMENTS) ×2 IMPLANT
SET ARTHROSCOPY PUMP TUBE (IRRIGATION / IRRIGATOR) ×2 IMPLANT
SET BASIN LINEN APH (SET/KITS/TRAYS/PACK) ×2 IMPLANT
SUT ETHILON 3 0 FSL (SUTURE) ×2 IMPLANT
SYR 30ML LL (SYRINGE) ×2 IMPLANT
SYRINGE 10CC LL (SYRINGE) ×2 IMPLANT
TUBE CONNECTING 12X1/4 (SUCTIONS) ×6 IMPLANT
WAND ARTHRO PARAGON T2 (SURGICAL WAND) IMPLANT

## 2016-08-02 NOTE — Progress Notes (Signed)
Right knee clipped.

## 2016-08-02 NOTE — Op Note (Addendum)
08/02/2016  10:00 AM  Preop diagnosis torn medial meniscus  Postoperative diagnosis arthritis trochlea  Surgeon Romeo AppleHarrison  Gen. anesthesia  Operative findings listed below   Complications none  Patient went to recovery room in stable condition  Sponge and needle counts were correct  Knee arthroscopy dictation  [id number]  The patient was identified in the preoperative holding area using 2 approved identification mechanisms. The chart was reviewed and updated. The surgical site was confirmed as right knee and marked with an indelible marker.  The patient was taken to the operating room for anesthesia. After successful  General  anesthesia, 2gm ancef was used as IV antibiotics.  The patient was placed in the supine position with the (right ) the operative extremity in an arthroscopic leg holder and the opposite extremity in a padded leg holder.  The timeout was executed.  A lateral portal was established with an 11 blade and the scope was introduced into the joint. A diagnostic arthroscopy was performed in circumferential manner examining the entire knee joint. A medial portal was established and the diagnostic arthroscopy was repeated using a probe to palpate intra-articular structures as they were encountered.   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  60 cc of Marcaine with epinephrine was injected through the arthroscope.  The portals were closed with 3-0 nylon suture.  A sterile bandage, Ace wrap and Cryo/Cuff was placed and the Cryo/Cuff was activated. The patient was taken to the recovery room in  stable condition.

## 2016-08-02 NOTE — Anesthesia Procedure Notes (Signed)
Procedure Name: Intubation Date/Time: 08/02/2016 9:20 AM Performed by: Franco NonesYATES, Marshawn Ninneman S Pre-anesthesia Checklist: Patient identified, Emergency Drugs available, Suction available, Patient being monitored and Timeout performed Patient Re-evaluated:Patient Re-evaluated prior to induction Oxygen Delivery Method: Circle system utilized Preoxygenation: Pre-oxygenation with 100% oxygen Induction Type: IV induction, Rapid sequence and Cricoid Pressure applied Laryngoscope Size: Glidescope and 3 (Lo Pro) Grade View: Grade I Tube type: Oral Tube size: 7.0 mm Number of attempts: 1 Airway Equipment and Method: Video-laryngoscopy,  Stylet and Oral airway Placement Confirmation: ETT inserted through vocal cords under direct vision,  positive ETCO2 and breath sounds checked- equal and bilateral Secured at: 22 cm Tube secured with: Tape Dental Injury: Teeth and Oropharynx as per pre-operative assessment

## 2016-08-02 NOTE — Transfer of Care (Signed)
Immediate Anesthesia Transfer of Care Note  Patient: [name]  Procedure(s) Performed: Procedure(s): chondroplasty (Right)  Patient Location: PACU  Anesthesia Type:General  Level of Consciousness: awake and patient cooperative  Airway & Oxygen Therapy: Patient Spontanous Breathing and non-rebreather face mask  Post-op Assessment: Report given to RN and Post -op Vital signs reviewed and stable  Post vital signs: Reviewed and stable  Last Vitals:  Vitals:   08/02/16 0840 08/02/16 0845  BP: (!) 150/77 122/70  Resp: (!) 34 (!) 29  Temp:      Last Pain: There were no vitals filed for this visit.       Complications: No apparent anesthesia complications

## 2016-08-02 NOTE — Progress Notes (Signed)
Instructed on [employer]ive Spirometer. 1000 ml obtained. Tolerated well.

## 2016-08-02 NOTE — Brief Op Note (Signed)
08/02/2016  9:54 AM  PATIENT:  Carolynn Serveadisha L Pickelsimer  36 y.o. female  PRE-OPERATIVE DIAGNOSIS:  medial meniscus tear chondromalacia patella  POST-OPERATIVE DIAGNOSIS:  arthritis of trochlea  PROCEDURE:  Procedure(s): chondroplasty (Right)knee  SURGEON:  Surgeon(s) and Role:    * Vickki HearingHarrison, Stanley E, MD - Primary  PHYSICIAN ASSISTANT:   ASSISTANTS: none   ANESTHESIA:   general  EBL:  Total I/O In: 800 [I.V.:800] Out: 0   BLOOD ADMINISTERED:none  DRAINS: none   LOCAL MEDICATIONS USED:  MARCAINE     SPECIMEN:  No Specimen  DISPOSITION OF SPECIMEN:  N/A  COUNTS:  YES  TOURNIQUET:    DICTATION: .Dragon Dictation  PLAN OF CARE: Discharge to home after PACU  PATIENT DISPOSITION:  PACU - hemodynamically stable.   Delay start of Pharmacological VTE agent (>24hrs) due to surgical blood loss or risk of bleeding: not applicable  [id number]

## 2016-08-02 NOTE — Anesthesia Preprocedure Evaluation (Signed)
Anesthesia Evaluation  Patient identified by MRN, date of birth, ID band Patient awake    Reviewed: Allergy & Precautions, NPO status , Patient's Chart, lab work & pertinent test results  Airway Mallampati: III  TM Distance: >3 FB Neck ROM: Full    Dental  (+) Teeth Intact   Pulmonary asthma , Current Smoker,   Inhaler this am  breath sounds clear to auscultation       Cardiovascular hypertension, Pt. on medications and Pt. on home beta blockers  Rhythm:Regular Rate:Normal     Neuro/Psych  Headaches,    GI/Hepatic   Endo/Other  diabetes, Type 2Hypothyroidism Morbid obesity  Renal/GU      Musculoskeletal   Abdominal   Peds  Hematology   Anesthesia Other Findings   Reproductive/Obstetrics                             Anesthesia Physical Anesthesia Plan  ASA: III  Anesthesia Plan: General   Post-op Pain Management:    Induction: Intravenous, Rapid sequence and Cricoid pressure planned  PONV Risk Score and Plan:   Airway Management Planned: Oral ETT and Video Laryngoscope Planned  Additional Equipment:   Intra-op Plan:   Post-operative Plan: Extubation in OR  Informed Consent: I have reviewed the patients History and Physical, chart, labs and discussed the procedure including the risks, benefits and alternatives for the proposed anesthesia with the patient or authorized representative who has indicated his/her understanding and acceptance.     Plan Discussed with:   Anesthesia Plan Comments:         Anesthesia Quick Evaluation

## 2016-08-02 NOTE — Interval H&P Note (Signed)
History and Physical Interval Note:  08/02/2016 8:23 AM  [name]  has presented today for surgery, with the diagnosis of medial meniscus tear chondromalacia patella  The various methods of treatment have been discussed with the patient and family. After consideration of risks, benefits and other options for treatment, the patient has consented to  Procedure(s): KNEE ARTHROSCOPY WITH MEDIAL MENISECTOMY (Right) as a surgical intervention .  The patient's history has been reviewed, patient examined, no change in status, stable for surgery.  I have reviewed the patient's chart and labs.  Questions were answered to the patient's satisfaction.     [name]

## 2016-08-02 NOTE — Anesthesia Postprocedure Evaluation (Signed)
Anesthesia Post Note  Patient: [name]  Procedure(s) Performed: Procedure(s) (LRB): chondroplasty (Right)  Patient location during evaluation: Short Stay Anesthesia Type: General Level of consciousness: awake and alert Pain management: satisfactory to patient Vital Signs Assessment: post-procedure vital signs reviewed and stable Respiratory status: spontaneous breathing Cardiovascular status: stable Postop Assessment: no signs of nausea or vomiting Anesthetic complications: no     Last Vitals:  Vitals:   08/02/16 1115 08/02/16 1124  BP: 132/84 (!) 126/50  Pulse: 61 61  Resp: (!) 23 18  Temp:  36.6 C    Last Pain:  Vitals:   08/02/16 1124  TempSrc: Oral  PainSc: 3                  Tavius Turgeon

## 2016-08-03 ENCOUNTER — Encounter (HOSPITAL_COMMUNITY): Payer: Self-pay | Admitting: Orthopedic Surgery

## 2016-08-03 MED ORDER — BUPIVACAINE-EPINEPHRINE (PF) 0.5% -1:200000 IJ SOLN
INTRAMUSCULAR | Status: DC | PRN
  Administered 2016-08-02: 60 mL via PERINEURAL

## 2016-08-09 ENCOUNTER — Other Ambulatory Visit (HOSPITAL_BASED_OUTPATIENT_CLINIC_OR_DEPARTMENT_OTHER): Payer: Self-pay

## 2016-08-09 DIAGNOSIS — R5383 Other fatigue: Secondary | ICD-10-CM

## 2016-08-09 DIAGNOSIS — R0683 Snoring: Secondary | ICD-10-CM

## 2016-08-10 ENCOUNTER — Ambulatory Visit (INDEPENDENT_AMBULATORY_CARE_PROVIDER_SITE_OTHER): Payer: 59 | Admitting: Orthopedic Surgery

## 2016-08-10 DIAGNOSIS — Z9889 Other specified postprocedural states: Secondary | ICD-10-CM

## 2016-08-10 DIAGNOSIS — Z4889 Encounter for other specified surgical aftercare: Secondary | ICD-10-CM

## 2016-08-10 MED ORDER — HYDROCODONE-ACETAMINOPHEN 5-325 MG PO TABS: 1.0000 | ORAL_TABLET | Freq: Four times a day (QID) | ORAL | 0 refills | Status: DC | PRN

## 2016-08-10 NOTE — Progress Notes (Signed)
Postop visit #1 status post knee arthroscopy   Chief complaint follow-up arthroscopy right knee 08/02/2016  HPI   Operative report PRE-OPERATIVE DIAGNOSIS:  medial meniscus tear chondromalacia patella   POST-OPERATIVE DIAGNOSIS:  arthritis of trochlea   PROCEDURE:  Procedure(s): chondroplasty (Right)knee   SURGEON:  Surgeon(s) and Role:    Vickki Hearing* Sativa Gelles E, MD - Primary    Problems issues concerned: Knee feels good but feels tight   Current Meds  Medication Sig  . albuterol (VENTOLIN HFA) 108 (90 BASE) MCG/ACT inhaler Inhale 2 puffs into the lungs every 6 (six) hours as needed for wheezing or shortness of breath.  . beclomethasone (QVAR) 80 MCG/ACT inhaler Inhale 2 puffs into the lungs 2 (two) times daily.  . carvedilol (COREG) 12.5 MG tablet Take 12.5 mg by mouth 2 (two) times daily with a meal.  . Cholecalciferol (VITAMIN D-3) 5000 units TABS Take 5,000 Units by mouth daily.   Marland Kitchen. levothyroxine (SYNTHROID, LEVOTHROID) 25 MCG tablet Take 25 mcg by mouth daily before breakfast.  . potassium chloride SA (KLOR-CON M20) 20 MEQ tablet Take 1 tablet (20 mEq total) by mouth 3 (three) times daily.  . [DISCONTINUED] HYDROcodone-acetaminophen (NORCO) 7.5-325 MG tablet Take 1 tablet by mouth every 4 (four) hours as needed for moderate pain.    Meds ordered this encounter  Medications  . HYDROcodone-acetaminophen (NORCO/VICODIN) 5-325 MG tablet    Sig: Take 1 tablet by mouth every 6 (six) hours as needed for moderate pain.    Dispense:  42 tablet    Refill:  0    The portal sites are clean dry and intact, the sutures were removed.  The calf was supple soft and the Homans sign was negative.  The patient is regaining knee flexion  Physical therapy will be started At home  Status post knee arthroscopy  Follow-up in  2 WEEKS

## 2016-08-24 ENCOUNTER — Ambulatory Visit (INDEPENDENT_AMBULATORY_CARE_PROVIDER_SITE_OTHER): Payer: 59 | Admitting: Orthopedic Surgery

## 2016-08-24 ENCOUNTER — Encounter: Payer: Self-pay | Admitting: Orthopedic Surgery

## 2016-08-24 DIAGNOSIS — Z9889 Other specified postprocedural states: Secondary | ICD-10-CM

## 2016-08-24 DIAGNOSIS — Z4889 Encounter for other specified surgical aftercare: Secondary | ICD-10-CM

## 2016-08-24 MED ORDER — HYDROCODONE-ACETAMINOPHEN 5-325 MG PO TABS: 1.0000 | ORAL_TABLET | Freq: Three times a day (TID) | ORAL | 0 refills | Status: DC | PRN

## 2016-08-24 MED ORDER — IBUPROFEN 800 MG PO TABS: 800.0000 mg | ORAL_TABLET | Freq: Three times a day (TID) | ORAL | 1 refills | Status: DC | PRN

## 2016-08-24 MED FILL — HYDROCODON-APAP 5-325: 5-325 | 14 days supply | Qty: 42 | Fill #0

## 2016-08-24 MED FILL — IBUPROFEN 800 MG TAB: 800 | 30 days supply | Qty: 90 | Fill #0

## 2016-08-24 NOTE — Patient Instructions (Signed)
Recommend aspercream 3 x a day   Recommend ibuprofen 800 mg for pain 3 x a day as needed   Ok to take hydrocoodne for severe pain for 2 weeks

## 2016-08-24 NOTE — Progress Notes (Signed)
Postoperative note  The patient is here for routine postop visit status post arthroscopy of the right knee  Date of surgery 08/02/2016  She had a chondroplasty of the trochlear 4 chondromalacia of the trochlea  She is doing well complains of soreness over the medial tibia for which she is currently taking hydrocodone  History referring her with normal gait no support required full range of motion of the knee tenderness over the medial tibial joint line  We recommended that she use ibuprofen, Aspercreme. She can take hydrocodone for another 2 weeks as needed and then use ibuprofen in the Aspercreme as a primary medications  No follow-up is required unless there is a problem.  Encounter Diagnoses  Name Primary?  Marland Kitchen. Aftercare following surgery Yes  . S/P right knee arthroscopy

## 2016-09-07 ENCOUNTER — Ambulatory Visit (HOSPITAL_BASED_OUTPATIENT_CLINIC_OR_DEPARTMENT_OTHER): Payer: 59 | Attending: Family Medicine | Admitting: Internal Medicine

## 2016-09-07 DIAGNOSIS — R0683 Snoring: Secondary | ICD-10-CM | POA: Diagnosis not present

## 2016-09-07 DIAGNOSIS — G4733 Obstructive sleep apnea (adult) (pediatric): Secondary | ICD-10-CM | POA: Diagnosis not present

## 2016-09-07 DIAGNOSIS — R0902 Hypoxemia: Secondary | ICD-10-CM | POA: Insufficient documentation

## 2016-09-07 DIAGNOSIS — R5383 Other fatigue: Secondary | ICD-10-CM | POA: Insufficient documentation

## 2016-09-15 DIAGNOSIS — R0683 Snoring: Secondary | ICD-10-CM | POA: Diagnosis not present

## 2016-09-15 NOTE — Procedures (Signed)
  Patient Name: [name], [name] Study Date: 09/07/2016 Gender: Female D.O.B: 05-Mar-1980 Age (years): 35 Referring Provider: Fanny DanceKelly Lynn Cobb FNP Height (inches): 63 Interpreting Physician: Jetty Duhamellinton Keahi Mccarney MD, ABSM Weight (lbs): 279 RPSGT: Rolene ArbourMcConnico, Yvonne BMI: 49 MRN: [id number] Neck Size: 16.00 CLINICAL INFORMATION Sleep Study Type: NPSG  Indication for sleep study: Fatigue, Obesity, Snoring  Epworth Sleepiness Score: 15  SLEEP STUDY TECHNIQUE As per the AASM Manual for the Scoring of Sleep and Associated Events v2.3 (April 2016) with a hypopnea requiring 4% desaturations.  The channels recorded and monitored were frontal, central and occipital EEG, electrooculogram (EOG), submentalis EMG (chin), nasal and oral airflow, thoracic and abdominal wall motion, anterior tibialis EMG, snore microphone, electrocardiogram, and pulse oximetry.  MEDICATIONS Medications self-administered by patient taken the night of the study : none reported  SLEEP ARCHITECTURE The study was initiated at 9:22:31 PM and ended at 4:32:02 AM.  Sleep onset time was 14.8 minutes and the sleep efficiency was 87.6%. The total sleep time was 376.2 minutes.  Stage REM latency was 312.5 minutes.  The patient spent 3.46% of the night in stage N1 sleep, 95.61% in stage N2 sleep, 0.00% in stage N3 and 0.93% in REM.  Alpha intrusion was absent.  Supine sleep was 16.51%.  RESPIRATORY PARAMETERS The overall apnea/hypopnea index (AHI) was 105.1 per hour. There were 291 total apneas, including 291 obstructive, 0 central and 0 mixed apneas. There were 368 hypopneas and 8 RERAs.  The AHI during Stage REM sleep was 85.7 per hour.  AHI while supine was 59.9 per hour.  The mean oxygen saturation was 91.94%. The minimum SpO2 during sleep was 71.00%.  Loud snoring was noted during this study.  CARDIAC DATA The 2 lead EKG demonstrated sinus rhythm. The mean heart rate was 64.34 beats per minute. Other EKG findings  include: PVCs.  LEG MOVEMENT DATA The total PLMS were 0 with a resulting PLMS index of 0.00. Associated arousal with leg movement index was 0.0 .  IMPRESSIONS - Severe obstructive sleep apnea occurred during this study (AHI = 105.1/h). - No significant central sleep apnea occurred during this study (CAI = 0.0/h). - Moderate oxygen desaturation was noted during this study (Min O2 = 71.00%). - The patient snored with Loud snoring volume. - EKG findings include PVCs. - Clinically significant periodic limb movements did not occur during sleep. No significant associated arousals.  DIAGNOSIS - Obstructive Sleep Apnea (327.23 [G47.33 ICD-10]) - Nocturnal Hypoxemia (327.26 [G47.36 ICD-10])  RECOMMENDATIONS - Therapeutic CPAP titration to determine optimal pressure required to alleviate sleep disordered breathing. - Most oxygen desaturation was associated with apnea events and is likely to respond to successful control of apnea, unless there is additional cardiopulmonary abnormality. - Be careful with alcohol, sedatives and other CNS depressants that may worsen sleep apnea and disrupt normal sleep architecture. - Sleep hygiene should be reviewed to assess factors that may improve sleep quality. - Weight management and regular exercise should be initiated or continued if appropriate.  [Electronically signed] 09/15/2016 09:20 AM  Jetty Duhamellinton Seneca Gadbois MD, ABSM Diplomate, American Board of Sleep Medicine   NPI: [id number]  Waymon BudgeYOUNG,Sarrah Fiorenza D Diplomate, American Board of Sleep Medicine  ELECTRONICALLY SIGNED ON:  09/15/2016, 9:16 AM [facility] PH: [phone]   FX: [phone] ACCREDITED BY THE AMERICAN ACADEMY OF SLEEP MEDICINE

## 2016-09-19 DIAGNOSIS — E559 Vitamin D deficiency, unspecified: Secondary | ICD-10-CM | POA: Diagnosis not present

## 2016-09-19 DIAGNOSIS — R7309 Other abnormal glucose: Secondary | ICD-10-CM | POA: Diagnosis not present

## 2016-09-19 DIAGNOSIS — R87612 Low grade squamous intraepithelial lesion on cytologic smear of cervix (LGSIL): Secondary | ICD-10-CM | POA: Diagnosis not present

## 2016-09-19 DIAGNOSIS — E039 Hypothyroidism, unspecified: Secondary | ICD-10-CM | POA: Diagnosis not present

## 2016-09-19 DIAGNOSIS — Z3009 Encounter for other general counseling and advice on contraception: Secondary | ICD-10-CM | POA: Diagnosis not present

## 2016-09-19 DIAGNOSIS — R8789 Other abnormal findings in specimens from female genital organs: Secondary | ICD-10-CM | POA: Diagnosis not present

## 2016-09-19 DIAGNOSIS — I10 Essential (primary) hypertension: Secondary | ICD-10-CM | POA: Diagnosis not present

## 2016-09-19 DIAGNOSIS — Z131 Encounter for screening for diabetes mellitus: Secondary | ICD-10-CM | POA: Diagnosis not present

## 2016-09-19 DIAGNOSIS — G132 Systemic atrophy primarily affecting the central nervous system in myxedema: Secondary | ICD-10-CM | POA: Diagnosis not present

## 2016-09-19 DIAGNOSIS — R5383 Other fatigue: Secondary | ICD-10-CM | POA: Diagnosis not present

## 2016-10-19 ENCOUNTER — Encounter: Payer: Self-pay | Admitting: Pulmonary Disease

## 2016-10-19 ENCOUNTER — Ambulatory Visit (INDEPENDENT_AMBULATORY_CARE_PROVIDER_SITE_OTHER): Payer: 59 | Admitting: Pulmonary Disease

## 2016-10-19 VITALS — BP 118/80 | HR 82 | Ht 63.0 in | Wt 278.0 lb

## 2016-10-19 DIAGNOSIS — Z6841 Body Mass Index (BMI) 40.0 and over, adult: Secondary | ICD-10-CM

## 2016-10-19 DIAGNOSIS — G4733 Obstructive sleep apnea (adult) (pediatric): Secondary | ICD-10-CM

## 2016-10-19 NOTE — Progress Notes (Signed)
   Subjective:    Patient ID: [name], female    DOB: 22-Jan-1980, 36 y.o.   MRN: [id number]  HPI    Review of Systems  Constitutional: Positive for unexpected weight change. Negative for fever.  HENT: Negative for congestion, dental problem, ear pain, nosebleeds, postnasal drip, rhinorrhea, sinus pressure, sneezing, sore throat and trouble swallowing.   Eyes: Positive for redness. Negative for itching.  Respiratory: Positive for cough, shortness of breath and wheezing. Negative for chest tightness.   Cardiovascular: Positive for leg swelling. Negative for palpitations.  Gastrointestinal: Positive for nausea. Negative for vomiting.  Genitourinary: Negative for dysuria.  Musculoskeletal: Positive for joint swelling.  Skin: Negative for rash.  Allergic/Immunologic: Negative.  Negative for environmental allergies, food allergies and immunocompromised state.  Neurological: Positive for headaches.  Hematological: Bruises/bleeds easily.  Psychiatric/Behavioral: Negative for dysphoric mood. The patient is nervous/anxious.        Objective:   Physical Exam        Assessment & Plan:

## 2016-10-19 NOTE — Patient Instructions (Signed)
Will arrange for CPAP set up Follow up in 2 months 

## 2016-10-19 NOTE — Progress Notes (Signed)
Past Surgical History She  has a past surgical history that includes extra digit removed (Left) and Knee arthroscopy with medial menisectomy (Right, 08/02/2016).  No Known Allergies  Family History Her family history includes Hypertension in her other; Hypothyroidism in her mother.  Social History She  reports that she has been smoking Cigarettes.  She has a 19.50 pack-year smoking history. She has never used smokeless tobacco. She reports that she drinks alcohol. She reports that she does not use drugs.  Review of systems Constitutional: Positive for unexpected weight change. Negative for fever.  HENT: Negative for congestion, dental problem, ear pain, nosebleeds, postnasal drip, rhinorrhea, sinus pressure, sneezing, sore throat and trouble swallowing.   Eyes: Positive for redness. Negative for itching.  Respiratory: Positive for cough, shortness of breath and wheezing. Negative for chest tightness.   Cardiovascular: Positive for leg swelling. Negative for palpitations.  Gastrointestinal: Positive for nausea. Negative for vomiting.  Genitourinary: Negative for dysuria.  Musculoskeletal: Positive for joint swelling.  Skin: Negative for rash.  Allergic/Immunologic: Negative.  Negative for environmental allergies, food allergies and immunocompromised state.  Neurological: Positive for headaches.  Hematological: Bruises/bleeds easily.  Psychiatric/Behavioral: Negative for dysphoric mood. The patient is nervous/anxious.     Current Outpatient Prescriptions on File Prior to Visit  Medication Sig  . albuterol (VENTOLIN HFA) 108 (90 BASE) MCG/ACT inhaler Inhale 2 puffs into the lungs every 6 (six) hours as needed for wheezing or shortness of breath.  . beclomethasone (QVAR) 80 MCG/ACT inhaler Inhale 2 puffs into the lungs 2 (two) times daily.  . carvedilol (COREG) 12.5 MG tablet Take 12.5 mg by mouth 2 (two) times daily with a meal.  . Cholecalciferol (VITAMIN D-3) 5000 units TABS Take 5,000  Units by mouth daily.   Marland Kitchen ibuprofen (ADVIL,MOTRIN) 800 MG tablet Take 1 tablet (800 mg total) by mouth every 8 (eight) hours as needed.  Marland Kitchen levothyroxine (SYNTHROID, LEVOTHROID) 25 MCG tablet Take 25 mcg by mouth daily before breakfast.  . potassium chloride SA (KLOR-CON M20) 20 MEQ tablet Take 1 tablet (20 mEq total) by mouth 3 (three) times daily.  . [DISCONTINUED] promethazine (PHENERGAN) 25 MG tablet Take 1 tablet (25 mg total) by mouth every 6 (six) hours as needed for nausea or vomiting. (Patient not taking: Reported on 12/27/2013)   No current facility-administered medications on file prior to visit.     Chief Complaint  Patient presents with  . Sleep Consult    Pt snores loudly, gets up about 2-3 times each night and on avg sleeps around 6 hrs. Pt sleepy during the day more than usual. Pt has SOB sitting and with exertion, worsen in last month. Pt wheezing and cough constant daily.    Sleep tests PSG 09/07/16 >> AHI 105.1, SpO2 low 71%  Past medical history She  has a past medical history of Arthritis; Asthma; Diabetes mellitus without complication (HCC); Headache; Hypertension; Hypothyroidism; Osteoporosis; and Thyroid disease.  Vital signs BP 118/80 (BP Location: Left Arm, Cuff Size: Normal)   Pulse 82   Ht  (1.6 m)   Wt 278 lb (126.1 kg)   SpO2 99%   BMI 49.25 kg/m   History of Present Illness [name] is a 36 y.o. female for evaluation of sleep problems.  She has noticed trouble feeling sleepy for years.  She can fall asleep anywhere.  She snores and stops breathing.  She wakes up feeling choked.  She can't sleep on her back.  She doesn't dream much.  She  goes to sleep at 8 pm.  She falls asleep almost immediately.  She wakes up some times to use the bathroom.  She gets out of bed at 7 am.  She feels tired in the morning.  She gets headache throughout the day.  She does not use anything to help her fall sleep or stay awake.  She denies sleep walking, sleep  talking, bruxism, or nightmares.  There is no history of restless legs.  She denies sleep hallucinations, sleep paralysis, or cataplexy.  The Epworth score is 19 out of 24.   Physical Exam:  General - No distress Eyes - pupils reactive ENT - No sinus tenderness, no oral exudate, no LAN, no thyromegaly, TM clear, pupils equal/reactive Cardiac - s1s2 regular, no murmur, pulses symmetric Chest - No wheeze/rales/dullness, good air entry, normal respiratory excursion Back - No focal tenderness Abd - Soft, non-tender, no organomegaly, + bowel sounds Ext - No edema Neuro - Normal strength, cranial nerves intact Skin - No rashes Psych - Normal mood, and behavior  Discussion: 36 yo female with hx of DM, HTN found to have severe obstructive sleep apnea on recent sleep study.  We discussed how sleep apnea can affect various health problems, including risks for hypertension, cardiovascular disease, and diabetes.  We also discussed how sleep disruption can increase risks for accidents, such as while driving.  Weight loss as a means of improving sleep apnea was also reviewed.  Additional treatment options discussed were CPAP therapy, oral appliance, and surgical intervention.   Assessment/plan:  Obstructive sleep apnea. - will arrange for auto CPAP set up  Obesity. - discussed importance of weight loss   Patient Instructions  Will arrange for CPAP set up  Follow up in 2 months   Coralyn Helling, M.D. Pager [phone] 10/19/2016, 3:52 PM

## 2016-10-30 DIAGNOSIS — G4733 Obstructive sleep apnea (adult) (pediatric): Secondary | ICD-10-CM | POA: Diagnosis not present

## 2016-11-30 DIAGNOSIS — G4733 Obstructive sleep apnea (adult) (pediatric): Secondary | ICD-10-CM | POA: Diagnosis not present

## 2016-12-11 ENCOUNTER — Encounter: Payer: Self-pay | Admitting: Pulmonary Disease

## 2016-12-11 DIAGNOSIS — G4733 Obstructive sleep apnea (adult) (pediatric): Secondary | ICD-10-CM | POA: Insufficient documentation

## 2016-12-20 ENCOUNTER — Encounter: Payer: Self-pay | Admitting: Pulmonary Disease

## 2016-12-20 ENCOUNTER — Ambulatory Visit (INDEPENDENT_AMBULATORY_CARE_PROVIDER_SITE_OTHER): Payer: 59 | Admitting: Pulmonary Disease

## 2016-12-20 VITALS — BP 118/76 | HR 94 | Ht 63.0 in | Wt 294.0 lb

## 2016-12-20 DIAGNOSIS — Z6841 Body Mass Index (BMI) 40.0 and over, adult: Secondary | ICD-10-CM | POA: Diagnosis not present

## 2016-12-20 DIAGNOSIS — Z9989 Dependence on other enabling machines and devices: Secondary | ICD-10-CM

## 2016-12-20 DIAGNOSIS — G4733 Obstructive sleep apnea (adult) (pediatric): Secondary | ICD-10-CM

## 2016-12-20 NOTE — Progress Notes (Signed)
Current Outpatient Medications on File Prior to Visit  Medication Sig  . albuterol (VENTOLIN HFA) 108 (90 BASE) MCG/ACT inhaler Inhale 2 puffs into the lungs every 6 (six) hours as needed for wheezing or shortness of breath.  . beclomethasone (QVAR) 80 MCG/ACT inhaler Inhale 2 puffs into the lungs 2 (two) times daily.  . carvedilol (COREG) 12.5 MG tablet Take 12.5 mg by mouth 2 (two) times daily with a meal.  . Cholecalciferol (VITAMIN D-3) 5000 units TABS Take 5,000 Units by mouth daily.   Marland Kitchen. ibuprofen (ADVIL,MOTRIN) 800 MG tablet Take 1 tablet (800 mg total) by mouth every 8 (eight) hours as needed.  Marland Kitchen. levothyroxine (SYNTHROID, LEVOTHROID) 25 MCG tablet Take 25 mcg by mouth daily before breakfast.  . [DISCONTINUED] promethazine (PHENERGAN) 25 MG tablet Take 1 tablet (25 mg total) by mouth every 6 (six) hours as needed for nausea or vomiting. (Patient not taking: Reported on 12/27/2013)   No current facility-administered medications on file prior to visit.      Chief Complaint  Patient presents with  . Follow-up    new cpap, feels she is still getting choked at night wearing the mask     Sleep tests PSG 09/07/16 >> AHI 105.1, SpO2 low 71% Auto CPAP 11/20/16 to 12/19/16 >> used on 22 of 30 nights with average 5 hrs 50 min.  Average AHI 0.3 with median CPAP 12 and 95 th percentile CPAP 16 cm H2O  Past medical history DM, HA, HTN, Hypothyroidism  Past surgical history, Family history, Social history, Allergies all reviewed.  Vital Signs BP 118/76 (BP Location: Left Arm, Cuff Size: Normal)   Pulse 94   Ht 5\' 3"  (1.6 m)   Wt 294 lb (133.4 kg)   SpO2 97%   BMI 52.08 kg/m   History of Present Illness [name] is a 36 y.o. female with obstructive sleep apnea.  She is sleeping much better since starting CPAP.  More alert.  No issues with mask fit.  Doesn't sleep whole night with CPAP.  Physical Exam  General - pleasant Eyes - pupils reactive ENT - no sinus tenderness, no  oral exudate, no LAN Cardiac - regular, no murmur Chest - no wheeze, rales Abd - soft, non tender Ext - no edema Skin - no rashes Neuro - normal strength Psych - normal mood  Assessment/Plan  Obstructive sleep apnea. - she is compliant with CPAP and reports benefit from therapy - advised her to use CPAP for entire time she is asleep to get maximal benefit - continue auto CPAP  Obesity. - discussed options to assist with weight loss   Patient Instructions  Follow up in 1 year    Coralyn HellingVineet Hutch Rhett, MD [facility] Pager:  [phone] 12/20/2016, 3:22 PM

## 2016-12-20 NOTE — Patient Instructions (Signed)
Follow up in 1 year.

## 2016-12-30 DIAGNOSIS — G4733 Obstructive sleep apnea (adult) (pediatric): Secondary | ICD-10-CM | POA: Diagnosis not present

## 2017-01-23 ENCOUNTER — Ambulatory Visit (INDEPENDENT_AMBULATORY_CARE_PROVIDER_SITE_OTHER): Payer: 59

## 2017-01-23 ENCOUNTER — Encounter: Payer: Self-pay | Admitting: Podiatry

## 2017-01-23 ENCOUNTER — Other Ambulatory Visit: Payer: Self-pay | Admitting: Podiatry

## 2017-01-23 ENCOUNTER — Ambulatory Visit (INDEPENDENT_AMBULATORY_CARE_PROVIDER_SITE_OTHER): Payer: 59 | Admitting: Podiatry

## 2017-01-23 DIAGNOSIS — M2142 Flat foot [pes planus] (acquired), left foot: Secondary | ICD-10-CM

## 2017-01-23 DIAGNOSIS — M722 Plantar fascial fibromatosis: Secondary | ICD-10-CM

## 2017-01-23 DIAGNOSIS — R238 Other skin changes: Secondary | ICD-10-CM | POA: Diagnosis not present

## 2017-01-23 DIAGNOSIS — Q6650 Congenital pes planus, unspecified foot: Secondary | ICD-10-CM

## 2017-01-23 DIAGNOSIS — M659 Synovitis and tenosynovitis, unspecified: Secondary | ICD-10-CM | POA: Diagnosis not present

## 2017-01-23 DIAGNOSIS — M2141 Flat foot [pes planus] (acquired), right foot: Secondary | ICD-10-CM

## 2017-01-23 DIAGNOSIS — R52 Pain, unspecified: Secondary | ICD-10-CM

## 2017-01-27 NOTE — Progress Notes (Signed)
   Subjective:  37 year old female presents today as a new patient for pain and tenderness to the medial sides of bilateral feet that has been ongoing for several years. She reports associated swelling of the feet. Patient also reports being flat footed. Patient relates significant pain and tenderness when walking. Resting the feet help alleviate the pain. Patient presents for further treatment and evaluation.   Past Medical History:  Diagnosis Date  . Arthritis   . Asthma   . Diabetes mellitus without complication (HCC)   . Headache   . Hypertension   . Hypothyroidism   . Osteoporosis   . Thyroid disease       Objective / Physical Exam:  General:  The patient is alert and oriented x3 in no acute distress. Dermatology:  Skin is warm, dry and supple bilateral lower extremities. Negative for open lesions or macerations. Vascular:  Palpable pedal pulses bilaterally. No edema or erythema noted. Capillary refill within normal limits. Neurological:  Epicritic and protective threshold grossly intact bilaterally.  Musculoskeletal Exam:  Pain on palpation to the anterior lateral medial aspects of the patient's bilateral ankles. Mild edema noted. Upon weightbearing there is a medial longitudinal arch collapse bilaterally. Remove foot valgus noted to the bilateral lower extremities with excessive pronation upon mid stance. Fat pad herniations noted to bilateral heels. Tenderness to palpation at the medial calcaneal tubercale and through the insertion of the plantar fascia of the bilateral feet. Range of motion within normal limits to all pedal and ankle joints bilateral. Muscle strength 5/5 in all groups bilateral.   Radiographic Exam:  Normal osseous mineralization. Joint spaces preserved. No fracture/dislocation/boney destruction.    Assessment: #1 bilateral ankle synovitis  #2 pes planus bilaterally #3 piezogenic pedal papules bilateral #4 plantar fasciitis bilateral   Plan of  Care:  #1 Patient was evaluated. #2 injection of 0.5 mL Celestone Soluspan injected in the patient's bilateral ankle joints. #3 Appointment with Raiford Nobleick for custom molded orthotics #4 Recommended good shoe gear. #5 patient is to return to clinic as needed.  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   Felecia ShellingBrent M. Jerson Furukawa, DPM [facility]  Dr. Felecia ShellingBrent M. Eddie Koc, DPM    [address]                                        [place], [place] [address]                Office [phone]  Fax [phone]

## 2017-01-29 ENCOUNTER — Ambulatory Visit (INDEPENDENT_AMBULATORY_CARE_PROVIDER_SITE_OTHER): Payer: 59 | Admitting: Orthotics

## 2017-01-29 DIAGNOSIS — M722 Plantar fascial fibromatosis: Secondary | ICD-10-CM | POA: Diagnosis not present

## 2017-01-29 DIAGNOSIS — M659 Synovitis and tenosynovitis, unspecified: Secondary | ICD-10-CM

## 2017-01-29 DIAGNOSIS — Q6650 Congenital pes planus, unspecified foot: Secondary | ICD-10-CM

## 2017-01-30 DIAGNOSIS — G4733 Obstructive sleep apnea (adult) (pediatric): Secondary | ICD-10-CM | POA: Diagnosis not present

## 2017-02-26 ENCOUNTER — Ambulatory Visit: Payer: 59 | Admitting: Orthotics

## 2017-02-26 DIAGNOSIS — Q6650 Congenital pes planus, unspecified foot: Secondary | ICD-10-CM

## 2017-02-26 DIAGNOSIS — M659 Synovitis and tenosynovitis, unspecified: Secondary | ICD-10-CM

## 2017-02-26 DIAGNOSIS — M722 Plantar fascial fibromatosis: Secondary | ICD-10-CM

## 2017-02-26 NOTE — Progress Notes (Signed)
Patient came in today to pick up custom made foot orthotics.  The goals were accomplished and the patient reported no dissatisfaction with said orthotics.  Patient was advised of breakin period and how to report any issues. 

## 2017-02-26 NOTE — Progress Notes (Signed)
Patient came into today to be cast for Custom Foot Orthotics. Upon recommendation of Dr. Logan BoresEvans Patient presents with synotitis rt ankle Goals are arch support to take pressure off ankle Plan vendor Rich[employer] to produce device based upon wt.

## 2017-03-02 DIAGNOSIS — G4733 Obstructive sleep apnea (adult) (pediatric): Secondary | ICD-10-CM | POA: Diagnosis not present

## 2017-03-10 ENCOUNTER — Encounter (HOSPITAL_COMMUNITY): Payer: Self-pay | Admitting: *Deleted

## 2017-03-10 ENCOUNTER — Other Ambulatory Visit: Payer: Self-pay

## 2017-03-10 ENCOUNTER — Ambulatory Visit (HOSPITAL_COMMUNITY)
Admission: EM | Admit: 2017-03-10 | Discharge: 2017-03-10 | Disposition: A | Discharge status: Home / Self Care | Payer: 59 | Attending: Family Medicine | Admitting: Family Medicine

## 2017-03-10 DIAGNOSIS — J22 Unspecified acute lower respiratory infection: Secondary | ICD-10-CM

## 2017-03-10 MED ORDER — AZITHROMYCIN 250 MG PO TABS: ORAL_TABLET | ORAL | 0 refills | Status: AC

## 2017-03-10 MED ORDER — BENZONATATE 100 MG PO CAPS: 100.0000 mg | ORAL_CAPSULE | Freq: Three times a day (TID) | ORAL | 0 refills | Status: DC

## 2017-03-10 NOTE — Discharge Instructions (Signed)
Push fluids to ensure adequate hydration and keep secretions thin.  Tylenol and/or ibuprofen as needed for pain or fevers.  Complete course of antibiotics.  Tessalon as needed for cough. May continue with mucinex as an expectorant.  If symptoms worsen or do not improve in the next week to return to be seen or to follow up with PCP.

## 2017-03-10 NOTE — ED Provider Notes (Signed)
[facility]    CSN: [id number] Arrival date & time: 03/10/17  1209     History   Chief Complaint Chief Complaint  Patient presents with  . Cough    chest pressure   . Nasal [name]  . [name]  . Michelle    HPI [name] is a 37 y.o. female.   Michelle SprangRadisha presents with complaints of worsening cough which is causing rib and chest pain as well as sore [name], [name], [name], Michelle and mild nausea. This started a week ago. Without vomiting or diarrhea. No known ill contacts. Does work at the hospital in [employer]. History of asthma, has had some wheezing, inhaler has helped some with this. Cough is productive. Urinating. Has had her flu vaccine this season. mucinex has minimally helped with cough. Has not taken tylenol or ibuprofen for her symptoms. Without rash.    ROS per HPI.       Past Medical History:  Diagnosis Date  . Arthritis   . Asthma   . Diabetes mellitus without complication (HCC)   . [name]   . Hypertension   . Hypothyroidism   . Osteoporosis   . Thyroid disease     Patient Active Problem List   Diagnosis Date Noted  . OSA (obstructive sleep apnea) 12/11/2016  . S/P right knee arthroscopy   . Chondromalacia of patellofemoral joint, right   . Cervical high risk HPV (human papillomavirus) test positive 05/03/2013    Past Surgical History:  Procedure Laterality Date  . extra digit removed Left   . KNEE ARTHROSCOPY WITH MEDIAL MENISECTOMY Right 08/02/2016   Procedure: chondroplasty;  Surgeon: Vickki HearingHarrison, Stanley E, MD;  Location: [facility];  Service: Orthopedics;  Laterality: Right;    OB History    Gravida Para Term Preterm AB Living   0 0 0 0 0 0   SAB TAB Ectopic Multiple Live Births   0 0 0 0         Home Medications    Prior to Admission medications   Medication Sig Start Date End Date Taking? Authorizing Provider  albuterol (VENTOLIN HFA) 108 (90 BASE) MCG/ACT inhaler Inhale 2 puffs into the lungs  every 6 (six) hours as needed for wheezing or shortness of breath.    [provider]  azithromycin (ZITHROMAX) 250 MG tablet Take 2 tablets (500 mg total) by mouth daily for 1 day, THEN 1 tablet (250 mg total) daily for 4 days. 03/10/17 03/15/17  Georgetta HaberBurky, Natalie B, NP  beclomethasone (QVAR) 80 MCG/ACT inhaler Inhale 2 puffs into the lungs 2 (two) times daily. 07/27/14   Hayden RasmussenMabe, David, NP  benzonatate (TESSALON) 100 MG capsule Take 1 capsule (100 mg total) by mouth every 8 (eight) hours. 03/10/17   Georgetta HaberBurky, Natalie B, NP  carvedilol (COREG) 12.5 MG tablet Take 12.5 mg by mouth 2 (two) times daily with a meal.    [provider]  Cholecalciferol (VITAMIN D-3) 5000 units TABS Take 5,000 Units by mouth daily.     [provider]  ibuprofen (ADVIL,MOTRIN) 800 MG tablet Take 1 tablet (800 mg total) by mouth every 8 (eight) hours as needed. 08/24/16   Vickki HearingHarrison, Stanley E, MD  levothyroxine (SYNTHROID, LEVOTHROID) 25 MCG tablet Take 25 mcg by mouth daily before breakfast.    [provider]  promethazine (PHENERGAN) 25 MG tablet Take 1 tablet (25 mg total) by mouth every 6 (six) hours as needed for nausea or vomiting. Patient not taking: Reported on 12/27/2013 08/20/13 07/27/14  Junious Silk, PA-C    Family History Family History  Problem Relation Age of Onset  . Hypothyroidism Mother   . Hypertension Other     Social History Social History   Tobacco Use  . Smoking status: Current Every Day Smoker    Packs/day: 1.50    Years: 13.00    Pack years: 19.50    Types: Cigarettes  . Smokeless tobacco: Never Used  Substance Use Topics  . Alcohol use: Yes    Comment: Social drinker   . Drug use: No     Allergies   Patient has no known allergies.   Review of Systems Review of Systems   Physical Exam Triage Vital Signs ED Triage Vitals  Enc Vitals Group     BP 03/10/17 1251 (!) 150/70     Pulse Rate 03/10/17 1251 89     Resp --      Temp 03/10/17 1251 99.1 F  (37.3 C)     Temp Source 03/10/17 1251 Oral     SpO2 03/10/17 1251 99 %     Weight --      Height --      Head Circumference --      Peak Flow --      Pain Score 03/10/17 1252 6     Pain Loc --      Pain Edu? --      Excl. in GC? --    No data found.  Updated Vital Signs BP (!) 150/70 (BP Location: Left Arm)   Pulse 89   Temp 99.1 F (37.3 C) (Oral)   SpO2 99%   Visual Acuity Right Eye Distance:   Left Eye Distance:   Bilateral Distance:    Right Eye Near:   Left Eye Near:    Bilateral Near:     Physical Exam  Constitutional: She is oriented to person, place, and time. She appears well-developed and well-nourished. No distress.  HENT:  Head: Normocephalic and atraumatic.  Right Ear: Tympanic membrane, external ear and ear canal normal.  Left Ear: Tympanic membrane, external ear and ear canal normal.  Nose: Mucosal edema and rhinorrhea present.  Mouth/[name]: Uvula is midline, oropharynx is clear and moist and mucous membranes are normal. No tonsillar exudate.  Eyes: Conjunctivae and EOM are normal. Pupils are equal, round, and reactive to light.  Cardiovascular: Normal rate, regular rhythm and normal heart sounds.  Pulmonary/Chest: Effort normal and breath sounds normal.  Strong congested cough noted; lungs clear at this time  Lymphadenopathy:    She has no cervical adenopathy.  Neurological: She is alert and oriented to person, place, and time.  Skin: Skin is warm and dry.     UC Treatments / Results  Labs (all labs ordered are listed, but only abnormal results are displayed) Labs Reviewed - No data to display  EKG  EKG Interpretation None       Radiology No results found.  Procedures Procedures (including critical care time)  Medications Ordered in UC Medications - No data to display   Initial Impression / Assessment and Plan / UC Course  I have reviewed the triage vital signs and the nursing notes.  Pertinent labs & imaging results that  were available during my care of the patient were reviewed by me and considered in my medical decision making (see chart for details).     Complete course of antibiotics.  Tessalon as needed for cough, may continue with mucinex as an expectorant. Push fluids. Tylenol and/or  ibuprofen as needed for pain or fevers. Albuterol as needed for wheezing. Return precautions provided. Patient verbalized understanding and agreeable to plan.    Final Clinical Impressions(s) / UC Diagnoses   Final diagnoses:  Lower respiratory tract infection    ED Discharge Orders        Ordered    azithromycin (ZITHROMAX) 250 MG tablet     03/10/17 1307    benzonatate (TESSALON) 100 MG capsule  Every 8 hours     03/10/17 1307       Controlled Substance Prescriptions [place] Controlled Substance Registry consulted? Not Applicable   Georgetta Haber, NP 03/10/17 1314

## 2017-03-10 NOTE — ED Triage Notes (Signed)
Cough causing chest pressure, nasal congestion, headaches, chills for about 1 wk,

## 2017-03-30 DIAGNOSIS — G4733 Obstructive sleep apnea (adult) (pediatric): Secondary | ICD-10-CM | POA: Diagnosis not present

## 2017-04-30 DIAGNOSIS — G4733 Obstructive sleep apnea (adult) (pediatric): Secondary | ICD-10-CM | POA: Diagnosis not present

## 2017-05-15 ENCOUNTER — Ambulatory Visit (INDEPENDENT_AMBULATORY_CARE_PROVIDER_SITE_OTHER): Payer: 59 | Admitting: Podiatry

## 2017-05-15 ENCOUNTER — Encounter: Payer: Self-pay | Admitting: Podiatry

## 2017-05-15 DIAGNOSIS — M659 Synovitis and tenosynovitis, unspecified: Secondary | ICD-10-CM | POA: Diagnosis not present

## 2017-05-15 MED ORDER — MELOXICAM 15 MG PO TABS: 15.0000 mg | ORAL_TABLET | Freq: Every day | ORAL | 0 refills | Status: DC

## 2017-05-15 MED ORDER — METHYLPREDNISOLONE 4 MG PO TABS: 4.0000 mg | ORAL_TABLET | Freq: Every day | ORAL | 0 refills | Status: DC

## 2017-05-15 MED FILL — METHYLPREDNISOLONE 4 MG TAB: 4 | 6 days supply | Qty: 21 | Fill #0

## 2017-05-15 MED FILL — MELOXICAM 15 MG TABLET: 15 | 30 days supply | Qty: 30 | Fill #0

## 2017-05-19 NOTE — Progress Notes (Signed)
   Subjective:  37 year old female presenting today for follow up evaluation of bilateral ankle pain. She also reports pain to the medial aspects of bilateral feet above the arches. She states the injections she received helped alleviate the pain for about one week. She states her orthotics are uncomfortable. Walking increases the pain. Patient is here for further evaluation and treatment.   Past Medical History:  Diagnosis Date  . Arthritis   . Asthma   . Diabetes mellitus without complication (HCC)   . Headache   . Hypertension   . Hypothyroidism   . Osteoporosis   . Thyroid disease      Objective / Physical Exam:  General:  The patient is alert and oriented x3 in no acute distress. Dermatology:  Skin is warm, dry and supple bilateral lower extremities. Negative for open lesions or macerations. Vascular:  Palpable pedal pulses bilaterally. No edema or erythema noted. Capillary refill within normal limits. Neurological:  Epicritic and protective threshold grossly intact bilaterally.  Musculoskeletal Exam:  Pain on palpation to the anterior lateral medial aspects of the patient's bilateral ankles. Mild edema noted. Range of motion within normal limits to all pedal and ankle joints bilateral. Muscle strength 5/5 in all groups bilateral.    Assessment: 1. pain in bilateral ankles  2. synovitis bilateral ankles   Plan of Care:  1. Patient was evaluated. 2. Prescription for Medrol Dose Pak provided to patient.  3. Prescription for Meloxicam provided to patient.  4. Ankle braces dispensed bilaterally.  5. Appointment with Raiford Noble for orthotics modification.  6. Return to clinic in 6 weeks.    Felecia Shelling, DPM [facility]  Dr. Felecia Shelling, DPM    [address]                                        [place], [place] [address]                Office [phone]  Fax [phone]

## 2017-05-29 ENCOUNTER — Other Ambulatory Visit: Payer: 59 | Admitting: Orthotics

## 2017-05-30 DIAGNOSIS — G4733 Obstructive sleep apnea (adult) (pediatric): Secondary | ICD-10-CM | POA: Diagnosis not present

## 2017-06-26 ENCOUNTER — Encounter: Payer: 59 | Admitting: Podiatry

## 2017-07-08 NOTE — Progress Notes (Signed)
This encounter was created in error - please disregard.

## 2017-10-07 DIAGNOSIS — Z131 Encounter for screening for diabetes mellitus: Secondary | ICD-10-CM | POA: Diagnosis not present

## 2017-10-07 DIAGNOSIS — Z3009 Encounter for other general counseling and advice on contraception: Secondary | ICD-10-CM | POA: Diagnosis not present

## 2017-10-07 DIAGNOSIS — E039 Hypothyroidism, unspecified: Secondary | ICD-10-CM | POA: Diagnosis not present

## 2017-10-07 DIAGNOSIS — R87610 Atypical squamous cells of undetermined significance on cytologic smear of cervix (ASC-US): Secondary | ICD-10-CM | POA: Diagnosis not present

## 2017-10-07 DIAGNOSIS — E559 Vitamin D deficiency, unspecified: Secondary | ICD-10-CM | POA: Diagnosis not present

## 2017-10-30 ENCOUNTER — Emergency Department (HOSPITAL_COMMUNITY)
Admission: EM | Admit: 2017-10-30 | Discharge: 2017-10-31 | Disposition: A | Discharge status: Home / Self Care | Payer: 59 | Attending: Emergency Medicine | Admitting: Emergency Medicine

## 2017-10-30 ENCOUNTER — Other Ambulatory Visit: Payer: Self-pay

## 2017-10-30 ENCOUNTER — Encounter (HOSPITAL_COMMUNITY): Payer: Self-pay | Admitting: Emergency Medicine

## 2017-10-30 DIAGNOSIS — I10 Essential (primary) hypertension: Secondary | ICD-10-CM | POA: Insufficient documentation

## 2017-10-30 DIAGNOSIS — Z79899 Other long term (current) drug therapy: Secondary | ICD-10-CM | POA: Insufficient documentation

## 2017-10-30 DIAGNOSIS — J45909 Unspecified asthma, uncomplicated: Secondary | ICD-10-CM | POA: Insufficient documentation

## 2017-10-30 DIAGNOSIS — E039 Hypothyroidism, unspecified: Secondary | ICD-10-CM | POA: Insufficient documentation

## 2017-10-30 DIAGNOSIS — J029 Acute pharyngitis, unspecified: Secondary | ICD-10-CM | POA: Insufficient documentation

## 2017-10-30 DIAGNOSIS — E119 Type 2 diabetes mellitus without complications: Secondary | ICD-10-CM | POA: Insufficient documentation

## 2017-10-30 DIAGNOSIS — Z7984 Long term (current) use of oral hypoglycemic drugs: Secondary | ICD-10-CM | POA: Insufficient documentation

## 2017-10-30 DIAGNOSIS — F17210 Nicotine dependence, cigarettes, uncomplicated: Secondary | ICD-10-CM | POA: Insufficient documentation

## 2017-10-30 LAB — CBG MONITORING, ED: Glucose-Capillary: 124 mg/dL — ABNORMAL HIGH (ref 70–99)

## 2017-10-30 LAB — GROUP A STREP BY PCR: Group A Strep by PCR: NOT DETECTED

## 2017-10-30 MED ORDER — SODIUM CHLORIDE 0.9 % IV BOLUS
1000.0000 mL | Freq: Once | INTRAVENOUS | Status: AC
  Administered 2017-10-30: 1000 mL via INTRAVENOUS

## 2017-10-30 MED ORDER — AMOXICILLIN 500 MG PO CAPS: 500.0000 mg | ORAL_CAPSULE | Freq: Two times a day (BID) | ORAL | 0 refills | Status: AC

## 2017-10-30 MED ORDER — KETOROLAC TROMETHAMINE 30 MG/ML IJ SOLN
15.0000 mg | Freq: Once | INTRAMUSCULAR | Status: AC
  Administered 2017-10-30: 15 mg via INTRAVENOUS
  Filled 2017-10-30: qty 1

## 2017-10-30 MED ORDER — DEXAMETHASONE SODIUM PHOSPHATE 4 MG/ML IJ SOLN
10.0000 mg | Freq: Once | INTRAMUSCULAR | Status: AC
  Administered 2017-10-30: 10 mg via INTRAVENOUS
  Filled 2017-10-30: qty 3

## 2017-10-30 MED ORDER — ACETAMINOPHEN 500 MG PO TABS
1000.0000 mg | ORAL_TABLET | Freq: Once | ORAL | Status: AC
  Administered 2017-10-30: 1000 mg via ORAL
  Filled 2017-10-30: qty 2

## 2017-10-30 NOTE — Discharge Instructions (Addendum)
You were evaluated today for a sore throat and fever.   You strep test was negative. I will prescribe you amoxacillin as the strep test is not 100% sensitive for strep pharyngitis. You may alternate Tylenol and Ibuprofen for fever. Please do not return to work until you are 24 hours without fever.  Follow-up with your primary care provider in 2 days if her symptoms have not resolved.  Return to the ED with any new or worsening symptoms such as:  Get help right away if: You have a stiff neck. You drool or cannot swallow liquids. You throw up (vomit) or are not able to keep medicine or liquids down. You have very bad pain that does not go away with medicine. You have problems breathing (not from a stuffy nose).

## 2017-10-30 NOTE — ED Triage Notes (Signed)
Pt c/o fever (hasnt checked with thermometer), sore throat and white prod cough x 3 days.

## 2017-10-30 NOTE — ED Provider Notes (Signed)
[facility] EMERGENCY DEPARTMENT Provider Note   CSN: [id number] Arrival date & time: 10/30/17  2056  History   Chief Complaint Chief Complaint  Patient presents with  . Fever  . Sore Throat    HPI [name] is a 37 y.o. female with a past medical history significant for DM, asthma, hypertension who presents for evaluation of sore throat and subjective fever x 1 day. Admits to exposure to strep pharyngitis at her place of employment, an [employer]field [employer] living facility. Admits to frontal headache x 3 hours. States her headache feels like a pressure. Denies chills, nausea, vomiting, neck pain, neck stiffness, chest pain, cough, SOB, diarrhea, abdominal pain, voice change, difficulty swallowing liquids.  Able to tolerate liquids without difficulty.  Denies aggravating or alleviating factors.   HPI  Past Medical History:  Diagnosis Date  . Arthritis   . Asthma   . Diabetes mellitus without complication (HCC)   . Headache   . Hypertension   . Hypothyroidism   . Osteoporosis   . Thyroid disease     Patient Active Problem List   Diagnosis Date Noted  . OSA (obstructive sleep apnea) 12/11/2016  . S/P right knee arthroscopy   . Chondromalacia of patellofemoral joint, right   . Cervical high risk HPV (human papillomavirus) test positive 05/03/2013    Past Surgical History:  Procedure Laterality Date  . extra digit removed Left   . KNEE ARTHROSCOPY WITH MEDIAL MENISECTOMY Right 08/02/2016   Procedure: chondroplasty;  Surgeon: Vickki Hearing, MD;  Location: [facility];  Service: Orthopedics;  Laterality: Right;     OB History    Gravida  0   Para  0   Term  0   Preterm  0   AB  0   Living  0     SAB  0   TAB  0   Ectopic  0   Multiple  0   Live Births               Home Medications    Prior to Admission medications   Medication Sig Start Date End Date Taking? Authorizing Provider  albuterol (VENTOLIN HFA) 108 (90 BASE) MCG/ACT inhaler Inhale 2  puffs into the lungs every 6 (six) hours as needed for wheezing or shortness of breath.   Yes [provider]  carvedilol (COREG) 12.5 MG tablet Take 12.5 mg by mouth 2 (two) times daily with a meal.   Yes [provider]  Cholecalciferol (VITAMIN D-3) 5000 units TABS Take 5,000 Units by mouth daily.    Yes [provider]  ibuprofen (ADVIL,MOTRIN) 800 MG tablet Take 1 tablet (800 mg total) by mouth every 8 (eight) hours as needed. 08/24/16  Yes Vickki Hearing, MD  levothyroxine (SYNTHROID, LEVOTHROID) 25 MCG tablet Take 25 mcg by mouth daily before breakfast.   Yes [provider]  metFORMIN (GLUCOPHAGE) 500 MG tablet TAKE 1 TABLET BY MOUTH TWICE DAILY WITH BREAKFAST AND WITH SUPPER 10/07/17  Yes [provider]  amoxicillin (AMOXIL) 500 MG capsule Take 1 capsule (500 mg total) by mouth 2 (two) times daily for 10 days. 10/30/17 11/09/17  Melani Brisbane A, PA-C  beclomethasone (QVAR) 80 MCG/ACT inhaler Inhale 2 puffs into the lungs 2 (two) times daily. 07/27/14   Hayden Rasmussen, NP    Family History Family History  Problem Relation Age of Onset  . Hypothyroidism Mother   . Hypertension Other     Social History Social History  Tobacco Use  . Smoking status: Current Every Day Smoker    Packs/day: 1.50    Years: 13.00    Pack years: 19.50    Types: Cigarettes  . Smokeless tobacco: Never Used  Substance Use Topics  . Alcohol use: Yes    Comment: Social drinker   . Drug use: No     Allergies   Patient has no known allergies.   Review of Systems Review of Systems  Constitutional: Positive for fatigue and fever. Negative for activity change, appetite change, chills, diaphoresis and unexpected weight change.  HENT: Positive for sinus pain and sore throat. Negative for congestion, dental problem, drooling, ear discharge, ear pain, facial swelling, hearing loss, mouth sores, nosebleeds, postnasal drip, rhinorrhea, sinus pressure, sneezing,  tinnitus, trouble swallowing and voice change.   Eyes: Negative for photophobia and pain.  Respiratory: Negative.   Cardiovascular: Negative.   Gastrointestinal: Negative.   Genitourinary: Negative.   Musculoskeletal: Negative for back pain, neck pain and neck stiffness.  Skin: Negative.   Neurological: Positive for headaches. Negative for dizziness, seizures, facial asymmetry, speech difficulty, weakness, light-headedness and numbness.     Physical Exam Updated Vital Signs BP (!) 114/52   Pulse 95   Temp (!) 101 F (38.3 C)   Resp 20   LMP 09/28/2017   SpO2 95%   Physical Exam  Constitutional: She appears well-developed and well-nourished.  Non-toxic appearance. She does not appear ill. No distress.  HENT:  Head: Atraumatic.  Right Ear: Tympanic membrane and ear canal normal. No drainage, swelling or tenderness. No middle ear effusion.  Left Ear: Tympanic membrane and ear canal normal. No drainage, swelling or tenderness.  No middle ear effusion.  Mouth/Throat: Uvula is midline. Mucous membranes are not pale, not dry and not cyanotic. No oral lesions. No uvula swelling. Posterior oropharyngeal erythema present. No posterior oropharyngeal edema or tonsillar abscesses. Tonsils are 1+ on the right. Tonsils are 1+ on the left. Tonsillar exudate.  Eyes: Pupils are equal, round, and reactive to light.  Neck: Trachea normal, normal range of motion, full passive range of motion without pain and phonation normal. Neck supple. No neck rigidity. No edema, no erythema and normal range of motion present. No thyromegaly present.  Cardiovascular: Normal rate, regular rhythm, normal heart sounds and intact distal pulses. Exam reveals no gallop and no friction rub.  No murmur heard. Pulmonary/Chest: Effort normal and breath sounds normal. No stridor. No respiratory distress. She has no wheezes. She has no rhonchi. She has no rales. She exhibits no tenderness.  Abdominal: Soft. Bowel sounds are  normal. She exhibits no distension and no mass. There is no tenderness. There is no rebound and no guarding. No hernia.  Musculoskeletal: Normal range of motion.  Lymphadenopathy:    She has no cervical adenopathy.  Neurological: She is alert.  Skin: Skin is warm and dry. No rash noted. No erythema. No pallor.  Psychiatric: She has a normal mood and affect.  Nursing note and vitals reviewed.    ED Treatments / Results  Labs (all labs ordered are listed, but only abnormal results are displayed) Labs Reviewed  CBG MONITORING, ED - Abnormal; Notable for the following components:      Result Value   Glucose-Capillary 124 (*)    All other components within normal limits  GROUP A STREP BY PCR    EKG None  Radiology No results found.  Procedures Procedures (including critical care time)  Medications Ordered in ED Medications  acetaminophen (TYLENOL)  tablet 1,000 mg (1,000 mg Oral Given 10/30/17 2125)  dexamethasone (DECADRON) injection 10 mg (10 mg Intravenous Given 10/30/17 2245)  sodium chloride 0.9 % bolus 1,000 mL (1,000 mLs Intravenous New Bag/Given 10/30/17 2244)  ketorolac (TORADOL) 30 MG/ML injection 15 mg (15 mg Intravenous Given 10/30/17 2246)     Initial Impression / Assessment and Plan / ED Course  I have reviewed the triage vital signs and the nursing notes.  Pertinent labs & imaging results that were available during my care of the patient were reviewed by me and considered in my medical decision making (see chart for details).  37 year old who appears otherwise well presents for evaluation of sore throat and fever. Known exposure to pharyngitis. Tonsillar edema with white exudate. No uvula deviation or trismus. Treated in the ED with steroids, NSAIDs.  Requesting antibiotics.  Does not want IM penicillin, would like to take oral antibiotics.  Given sensitivity of strep pharyngitis test I feel appropriate to prescribe antibiotics for patient. Pt appears mildly  dehydrated, discussed importance of water rehydration. Presentation non concerning for PTA or RPA. Specific return precautions discussed. Pt able to drink water in ED without difficulty with intact air way. Recommended PCP follow up.   Patient was seen and assessed by my attending, Dr. Adriana Simas agrees with treatment, plan and disposition of patient.  This note was dictated with voice recognition software.  Despite best efforts at proofreading, errors may occur which can change the documentation meaning.   Final Clinical Impressions(s) / ED Diagnoses   Final diagnoses:  Pharyngitis, unspecified etiology    ED Discharge Orders         Ordered    amoxicillin (AMOXIL) 500 MG capsule  2 times daily     10/30/17 2351           Demetria Lightsey A, PA-C 10/31/17 0004    Donnetta Hutching, MD 11/01/17 1201

## 2017-11-12 ENCOUNTER — Encounter: Payer: Self-pay | Admitting: Radiology

## 2017-11-12 ENCOUNTER — Telehealth: Payer: Self-pay | Admitting: Radiology

## 2017-11-12 NOTE — Telephone Encounter (Signed)
Left voicemail for patient to call [id number], referred to by [facility] department for Dr Macon Large, medical records have been scanned.

## 2017-11-21 ENCOUNTER — Encounter: Payer: Self-pay | Admitting: Obstetrics & Gynecology

## 2017-11-25 ENCOUNTER — Encounter: Payer: Self-pay | Admitting: Obstetrics & Gynecology

## 2017-11-25 DIAGNOSIS — R87810 Cervical high risk human papillomavirus (HPV) DNA test positive: Secondary | ICD-10-CM

## 2018-03-12 DIAGNOSIS — E139 Other specified diabetes mellitus without complications: Secondary | ICD-10-CM | POA: Diagnosis not present

## 2018-03-12 DIAGNOSIS — E559 Vitamin D deficiency, unspecified: Secondary | ICD-10-CM | POA: Diagnosis not present

## 2018-03-12 DIAGNOSIS — Z131 Encounter for screening for diabetes mellitus: Secondary | ICD-10-CM | POA: Diagnosis not present

## 2018-03-12 DIAGNOSIS — I10 Essential (primary) hypertension: Secondary | ICD-10-CM | POA: Diagnosis not present

## 2018-03-12 DIAGNOSIS — M79672 Pain in left foot: Secondary | ICD-10-CM | POA: Diagnosis not present

## 2018-03-12 DIAGNOSIS — R1031 Right lower quadrant pain: Secondary | ICD-10-CM | POA: Diagnosis not present

## 2018-05-09 DIAGNOSIS — G4733 Obstructive sleep apnea (adult) (pediatric): Secondary | ICD-10-CM | POA: Diagnosis not present

## 2018-08-20 DIAGNOSIS — E559 Vitamin D deficiency, unspecified: Secondary | ICD-10-CM | POA: Diagnosis not present

## 2018-08-20 DIAGNOSIS — Z131 Encounter for screening for diabetes mellitus: Secondary | ICD-10-CM | POA: Diagnosis not present

## 2018-08-20 DIAGNOSIS — G132 Systemic atrophy primarily affecting the central nervous system in myxedema: Secondary | ICD-10-CM | POA: Diagnosis not present

## 2018-08-20 DIAGNOSIS — E1165 Type 2 diabetes mellitus with hyperglycemia: Secondary | ICD-10-CM | POA: Diagnosis not present

## 2018-08-20 DIAGNOSIS — I10 Essential (primary) hypertension: Secondary | ICD-10-CM | POA: Diagnosis not present

## 2018-08-20 DIAGNOSIS — E039 Hypothyroidism, unspecified: Secondary | ICD-10-CM | POA: Diagnosis not present

## 2018-08-27 DIAGNOSIS — Z20828 Contact with and (suspected) exposure to other viral communicable diseases: Secondary | ICD-10-CM | POA: Diagnosis not present

## 2018-09-03 DIAGNOSIS — Z20828 Contact with and (suspected) exposure to other viral communicable diseases: Secondary | ICD-10-CM | POA: Diagnosis not present

## 2018-09-10 DIAGNOSIS — Z20828 Contact with and (suspected) exposure to other viral communicable diseases: Secondary | ICD-10-CM | POA: Diagnosis not present

## 2018-09-10 DIAGNOSIS — U071 COVID-19: Secondary | ICD-10-CM | POA: Diagnosis not present

## 2018-09-11 DIAGNOSIS — E119 Type 2 diabetes mellitus without complications: Secondary | ICD-10-CM | POA: Diagnosis not present

## 2018-09-11 DIAGNOSIS — G4730 Sleep apnea, unspecified: Secondary | ICD-10-CM | POA: Diagnosis not present

## 2018-09-17 ENCOUNTER — Other Ambulatory Visit: Payer: Self-pay | Admitting: General Surgery

## 2018-10-09 ENCOUNTER — Other Ambulatory Visit: Payer: Self-pay | Admitting: General Surgery

## 2018-10-09 ENCOUNTER — Other Ambulatory Visit: Payer: Self-pay

## 2018-10-09 ENCOUNTER — Ambulatory Visit (HOSPITAL_COMMUNITY)
Admission: RE | Admit: 2018-10-09 | Discharge: 2018-10-09 | Disposition: A | Discharge status: Home / Self Care | Payer: 59 | Source: Ambulatory Visit | Attending: General Surgery | Admitting: General Surgery

## 2018-10-09 DIAGNOSIS — Z01818 Encounter for other preprocedural examination: Secondary | ICD-10-CM | POA: Diagnosis not present

## 2018-10-09 DIAGNOSIS — K219 Gastro-esophageal reflux disease without esophagitis: Secondary | ICD-10-CM | POA: Diagnosis not present

## 2018-10-16 DIAGNOSIS — Z20828 Contact with and (suspected) exposure to other viral communicable diseases: Secondary | ICD-10-CM | POA: Diagnosis not present

## 2018-10-23 ENCOUNTER — Ambulatory Visit: Payer: Self-pay | Admitting: Dietician

## 2018-10-23 DIAGNOSIS — U071 COVID-19: Secondary | ICD-10-CM | POA: Diagnosis not present

## 2018-10-23 DIAGNOSIS — Z20828 Contact with and (suspected) exposure to other viral communicable diseases: Secondary | ICD-10-CM | POA: Diagnosis not present

## 2018-11-20 ENCOUNTER — Encounter: Payer: Self-pay | Admitting: Dietician

## 2018-11-20 ENCOUNTER — Encounter: Payer: 59 | Attending: General Surgery | Admitting: Dietician

## 2018-11-20 ENCOUNTER — Other Ambulatory Visit: Payer: Self-pay

## 2018-11-20 DIAGNOSIS — E669 Obesity, unspecified: Secondary | ICD-10-CM | POA: Insufficient documentation

## 2018-11-20 NOTE — Progress Notes (Signed)
Nutrition Assessment for Bariatric Surgery Medical Nutrition Therapy  Appt Start Time: 10:30am    End Time: 11:15am  Patient was seen on 11/20/2018 for Pre-Operative Nutrition Assessment. Letter of approval faxed to [facility] bariatric surgery program coordinator on 11/20/2018.   Referral stated Supervised Weight Loss (SWL) visits needed: 6 (*Referral states pt is to enroll in a wellness program for these since she is diabetic)   Planned surgery: Sleeve Pt expectation of surgery: to lose weight and help feet feel better    NUTRITION ASSESSMENT   Anthropometrics  Start weight at [facility]: 298.5 lbs (date: 11/20/2018) Height: 63 in BMI: 52.9 kg/m2    Clinical  Medical hx: obesity, thyroid disease, osteoporosis, HTN, diabetes, asthma, arthritis, sleep apnea Medications: metformin, vitamin D3, levothyroxine, albuterol, carvedilol   Notable signs/symptoms: nausea after eating (will chew gum to help)    Lifestyle & Dietary Hx Lives with her wife, pt states she is also planning to have bariatric surgery as well. Works in [employer]. Was very quiet and reserved during appointment, did not provide much info/history, had to be asked multiple questions multiple times to gather any info.   Typical meal pattern is 1 meal and snacks each day. Sometimes skips breakfast, but will have cereal if anything. May pack a sandwich for lunch. Dinner is typically meat (chicken or steak) with sides (starch and vegetables.)   States she "doesn't drink enough" during the day when asked how much fluid she drinks.   24-Hr Dietary Recall First Meal: - Snack: chips  Second Meal: -  Snack: chips  Third Meal: baked chicken + mac & cheese + collard greens  Snack: - Beverages: water, soda, protein shake   Estimated Energy Needs Calories: 1600 Carbohydrate: 180g Protein: 100g Fat: 53g   NUTRITION DIAGNOSIS  Overweight/obesity ([place]-3.3) related to past poor dietary habits and physical inactivity  as evidenced by patient w/ planned Sleeve Gastrectomy surgery following dietary guidelines for continued weight loss.    NUTRITION INTERVENTION  Nutrition counseling (C-1) and education (E-2) to facilitate bariatric surgery goals.  Pre-Op Goals Reviewed with the Patient . Track food and beverage intake (pen and paper, MyFitness Pal, Baritastic app, etc.) . Make healthy food choices while monitoring portion sizes . Consume 3 meals per day or try to eat every 3-5 hours . Avoid concentrated sugars and fried foods . Keep sugar & fat in the single digits per serving on food labels . Practice CHEWING your food (aim for applesauce consistency) . Practice not drinking 15 minutes before, during, and 30 minutes after each meal and snack . Avoid all carbonated beverages (ex: soda, sparkling beverages)  . Limit caffeinated beverages (ex: coffee, tea, energy drinks) . Avoid all sugar-sweetened beverages (ex: regular soda, sports drinks)  . Avoid alcohol  . Aim for 64-100 ounces of FLUID daily (with at least half of fluid intake being plain water)  . Aim for at least 60-80 grams of PROTEIN daily . Look for a liquid protein source that contains ?15 g protein and ?5 g carbohydrate (ex: shakes, drinks, shots) . Make a list of non-food related activities . Physical activity is an important part of a healthy lifestyle so keep it moving! The goal is to reach 150 minutes of exercise per week, including cardiovascular and weight baring activity.  Handouts Provided Include  . Bariatric Surgery handouts (Nutrition Visits, Pre-Op Goals, Protein Shakes, Vitamins & Minerals)  Learning Style & Readiness for Change Teaching method utilized: Visual & Auditory  Demonstrated degree of understanding  via: Teach Back  Barriers to learning/adherence to lifestyle change: None Identified    MONITORING & EVALUATION Dietary intake, weekly physical activity, body weight, and pre-op goals reached at next nutrition visit.    Next Steps Patient is to call [facility] to enroll in Pre-Op Class (>2 weeks before surgery) and Post-Op Class (2 weeks after surgery) for further nutrition education when surgery date is scheduled and after 6 Supervised Weight Loss visits are completed with an appropriate wellness program.

## 2018-11-27 ENCOUNTER — Emergency Department (HOSPITAL_COMMUNITY)
Admission: EM | Admit: 2018-11-27 | Discharge: 2018-11-27 | Disposition: A | Discharge status: Home / Self Care | Payer: 59 | Attending: Emergency Medicine | Admitting: Emergency Medicine

## 2018-11-27 ENCOUNTER — Encounter (HOSPITAL_COMMUNITY): Payer: Self-pay

## 2018-11-27 ENCOUNTER — Other Ambulatory Visit: Payer: Self-pay

## 2018-11-27 DIAGNOSIS — Z7984 Long term (current) use of oral hypoglycemic drugs: Secondary | ICD-10-CM | POA: Insufficient documentation

## 2018-11-27 DIAGNOSIS — J45909 Unspecified asthma, uncomplicated: Secondary | ICD-10-CM | POA: Insufficient documentation

## 2018-11-27 DIAGNOSIS — Z79899 Other long term (current) drug therapy: Secondary | ICD-10-CM | POA: Diagnosis not present

## 2018-11-27 DIAGNOSIS — R739 Hyperglycemia, unspecified: Secondary | ICD-10-CM

## 2018-11-27 DIAGNOSIS — E039 Hypothyroidism, unspecified: Secondary | ICD-10-CM | POA: Diagnosis not present

## 2018-11-27 DIAGNOSIS — M79671 Pain in right foot: Secondary | ICD-10-CM | POA: Insufficient documentation

## 2018-11-27 DIAGNOSIS — F17210 Nicotine dependence, cigarettes, uncomplicated: Secondary | ICD-10-CM | POA: Diagnosis not present

## 2018-11-27 DIAGNOSIS — E1165 Type 2 diabetes mellitus with hyperglycemia: Secondary | ICD-10-CM | POA: Insufficient documentation

## 2018-11-27 DIAGNOSIS — M79673 Pain in unspecified foot: Secondary | ICD-10-CM

## 2018-11-27 DIAGNOSIS — M79672 Pain in left foot: Secondary | ICD-10-CM | POA: Insufficient documentation

## 2018-11-27 LAB — CBC
HCT: 42.5 % (ref 36.0–46.0)
Hemoglobin: 12.6 g/dL (ref 12.0–15.0)
MCH: 22.4 pg — ABNORMAL LOW (ref 26.0–34.0)
MCHC: 29.6 g/dL — ABNORMAL LOW (ref 30.0–36.0)
MCV: 75.6 fL — ABNORMAL LOW (ref 80.0–100.0)
Platelets: 262 10*3/uL (ref 150–400)
RBC: 5.62 MIL/uL — ABNORMAL HIGH (ref 3.87–5.11)
RDW: 18.3 % — ABNORMAL HIGH (ref 11.5–15.5)
WBC: 9.1 10*3/uL (ref 4.0–10.5)
nRBC: 0 % (ref 0.0–0.2)

## 2018-11-27 LAB — BASIC METABOLIC PANEL
Anion gap: 10 (ref 5–15)
BUN: 8 mg/dL (ref 6–20)
CO2: 26 mmol/L (ref 22–32)
Calcium: 9.5 mg/dL (ref 8.9–10.3)
Chloride: 103 mmol/L (ref 98–111)
Creatinine, Ser: 0.59 mg/dL (ref 0.44–1.00)
GFR calc Af Amer: >60 mL/min (ref >60)
GFR calc non Af Amer: >60 mL/min (ref >60)
Glucose, Bld: 135 mg/dL — ABNORMAL HIGH (ref 70–99)
Potassium: 4 mmol/L (ref 3.5–5.1)
Sodium: 139 mmol/L (ref 135–145)

## 2018-11-27 NOTE — ED Provider Notes (Signed)
[facility] EMERGENCY DEPARTMENT Provider Note   CSN: [id number] Arrival date & time: 11/27/18  1227     History   Chief Complaint Chief Complaint  Patient presents with  . Leg Swelling    HPI [name] is a 38 y.o. female with a past medical history of morbid obesity, hypertension, hypothyroidism.  She presents with bilateral foot pain, leg swelling and complains of associated sharp pains which are worse with ambulation, some numbness and tingling in the feet and some difficulty with balance.  Patient states that just prior to onset of the coronavirus pandemic her PCP told her that her hemoglobin had just barely qualify for diabetic she has not taken any medications for.  She has had previous episodes of intermittent bilateral leg swelling which is improved when she elevates her legs at night.  She has a history of pes planus with previous podiatric consult for instep pain.  She states that she was given orthotics which made her pain worse and she has never worn them.  She denies chest pain, shortness of breath.     HPI  Past Medical History:  Diagnosis Date  . Arthritis   . Asthma   . Diabetes mellitus without complication (HCC)   . Headache   . Hypertension   . Hypothyroidism   . Osteoporosis   . Thyroid disease     Patient Active Problem List   Diagnosis Date Noted  . OSA (obstructive sleep apnea) 12/11/2016  . S/P right knee arthroscopy   . Chondromalacia of patellofemoral joint, right   . Cervical dysplasia 05/03/2013    Past Surgical History:  Procedure Laterality Date  . extra digit removed Left   . KNEE ARTHROSCOPY WITH MEDIAL MENISECTOMY Right 08/02/2016   Procedure: chondroplasty;  Surgeon: Vickki HearingHarrison, Stanley E, MD;  Location: [facility];  Service: Orthopedics;  Laterality: Right;     OB History    Gravida  0   Para  0   Term  0   Preterm  0   AB  0   Living  0     SAB  0   TAB  0   Ectopic  0   Multiple  0   Live Births              Home Medications    Prior to Admission medications   Medication Sig Start Date End Date Taking? Authorizing Provider  albuterol (VENTOLIN HFA) 108 (90 BASE) MCG/ACT inhaler Inhale 2 puffs into the lungs every 6 (six) hours as needed for wheezing or shortness of breath.   Yes [provider]  beclomethasone (QVAR) 80 MCG/ACT inhaler Inhale 2 puffs into the lungs 2 (two) times daily. Patient taking differently: Inhale 2 puffs into the lungs 2 (two) times daily as needed.  07/27/14  Yes Mabe, Onalee Huaavid, NP  Cholecalciferol (VITAMIN D-3) 5000 units TABS Take 5,000 Units by mouth daily.    Yes [provider]  carvedilol (COREG) 12.5 MG tablet Take 12.5 mg by mouth 2 (two) times daily with a meal.    [provider]  levothyroxine (SYNTHROID, LEVOTHROID) 25 MCG tablet Take 25 mcg by mouth daily before breakfast.    [provider]  metFORMIN (GLUCOPHAGE) 500 MG tablet Take 500 mg by mouth 2 (two) times daily with a meal.  10/07/17   [provider]    Family History Family History  Problem Relation Age of Onset  . Hypothyroidism Mother   . Hypertension Other  Social History Social History   Tobacco Use  . Smoking status: Current Every Day Smoker    Packs/day: 1.50    Years: 13.00    Pack years: 19.50    Types: Cigarettes  . Smokeless tobacco: Never Used  Substance Use Topics  . Alcohol use: Yes    Comment: Social drinker   . Drug use: No     Allergies   Patient has no known allergies.   Review of Systems Review of Systems Ten systems reviewed and are negative for acute change, except as noted in the HPI.    Physical Exam Updated Vital Signs BP 128/89 (BP Location: Right Arm)   Pulse 79   Temp 98.1 F (36.7 C) (Oral)   Resp 20   Ht 5\' 3"  (1.6 m)   Wt 131.1 kg   LMP 11/04/2018   SpO2 100%   BMI 51.19 kg/m   Physical Exam Vitals signs and nursing note reviewed.  Constitutional:      General: She is not in acute  distress.    Appearance: She is well-developed. She is not diaphoretic.  HENT:     Head: Normocephalic and atraumatic.  Eyes:     General: No scleral icterus.    Conjunctiva/sclera: Conjunctivae normal.  Neck:     Musculoskeletal: Normal range of motion.  Cardiovascular:     Rate and Rhythm: Normal rate and regular rhythm.     Pulses:          Dorsalis pedis pulses are 2+ on the right side and 2+ on the left side.       Posterior tibial pulses are 2+ on the right side and 2+ on the left side.     Heart sounds: Normal heart sounds. No murmur. No friction rub. No gallop.   Pulmonary:     Effort: Pulmonary effort is normal. No respiratory distress.     Breath sounds: Normal breath sounds.  Abdominal:     General: Bowel sounds are normal. There is no distension.     Palpations: Abdomen is soft. There is no mass.     Tenderness: There is no abdominal tenderness. There is no guarding.  Musculoskeletal:     Right lower leg: 1+ Pitting Edema present.     Left lower leg: 1+ Pitting Edema present.       Feet:  Feet:     Right foot:     Skin integrity: Callus present.     Left foot:     Skin integrity: Callus present.  Skin:    General: Skin is warm and dry.  Neurological:     Mental Status: She is alert and oriented to person, place, and time.  Psychiatric:        Behavior: Behavior normal.      ED Treatments / Results  Labs (all labs ordered are listed, but only abnormal results are displayed) Labs Reviewed  BASIC METABOLIC PANEL  CBC    EKG None  Radiology No results found.  Procedures Procedures (including critical care time)  Medications Ordered in ED Medications - No data to display   Initial Impression / Assessment and Plan / ED Course  I have reviewed the triage vital signs and the nursing notes.  Pertinent labs & imaging results that were available during my care of the patient were reviewed by me and considered in my medical decision making (see chart  for details).  Clinical Course as of Nov 27 1643  Thu Nov 27, 2018  1630 Glucose(!): 135 [AH]    Clinical Course User Index [AH] Margarita Mail, PA-C       38 year old female with untreated hyperglycemia versus diabetes.  Do think she has a component of peripheral neuropathy however the patient also has severe pes planus, thickened callus formation on the instep of both of her feet which is where she localizes the majority of her pain with significant pronation of her ankles due to her pes planus and obesity.  Patient does have orthotics which she has not been wearing and I have encouraged encouraged her to use these, she may use ice and heat on her feet as well as Tylenol and Motrin.  I reviewed her labs personally which show of blood glucose of 135 today.  Explained that she will need to get her hemoglobin A1c tested with her physician and perhaps started on medications for blood sugar management.  She has already seen a podiatrist and will need to follow closely with them.  Regarding her swelling I think this represents some peripheral edema secondary to venous stasis after being on her feet all day and I have encouraged her to use compression stockings and take time to elevate her feet and legs.  Patient appears otherwise appropriately medically screened.  I doubt any other emergent cause such as blood clot, infection, osteomyelitis, gout.  She has no evidence of such on physical examination.  Appropriate for discharge at this time.  Final Clinical Impressions(s) / ED Diagnoses   Final diagnoses:  None    ED Discharge Orders    None       Margarita Mail, PA-C 11/27/18 1652    Isla Pence, MD 11/28/18 [phone]

## 2018-11-27 NOTE — ED Triage Notes (Signed)
Pt reports swelling in both feet off and on for "years."  Reprots this episode started last week.  Denies any cp or sob.    Pt reports has darkened areas on bottom of feet that are tender to touch.

## 2018-11-27 NOTE — Discharge Instructions (Signed)
Your blood sugar was elevated and it is imp

## 2018-12-19 DIAGNOSIS — Z124 Encounter for screening for malignant neoplasm of cervix: Secondary | ICD-10-CM | POA: Diagnosis not present

## 2018-12-19 DIAGNOSIS — R87612 Low grade squamous intraepithelial lesion on cytologic smear of cervix (LGSIL): Secondary | ICD-10-CM | POA: Diagnosis not present

## 2018-12-19 DIAGNOSIS — Z0000 Encounter for general adult medical examination without abnormal findings: Secondary | ICD-10-CM | POA: Diagnosis not present

## 2018-12-19 DIAGNOSIS — N926 Irregular menstruation, unspecified: Secondary | ICD-10-CM | POA: Diagnosis not present

## 2018-12-19 DIAGNOSIS — E119 Type 2 diabetes mellitus without complications: Secondary | ICD-10-CM | POA: Diagnosis not present

## 2018-12-19 DIAGNOSIS — Z113 Encounter for screening for infections with a predominantly sexual mode of transmission: Secondary | ICD-10-CM | POA: Diagnosis not present

## 2018-12-19 DIAGNOSIS — Z87898 Personal history of other specified conditions: Secondary | ICD-10-CM | POA: Diagnosis not present

## 2019-01-01 ENCOUNTER — Other Ambulatory Visit (HOSPITAL_COMMUNITY): Payer: Self-pay | Admitting: Family Medicine

## 2019-01-01 ENCOUNTER — Other Ambulatory Visit: Payer: Self-pay | Admitting: Family Medicine

## 2019-01-01 DIAGNOSIS — R1011 Right upper quadrant pain: Secondary | ICD-10-CM

## 2019-01-07 ENCOUNTER — Other Ambulatory Visit: Payer: Self-pay

## 2019-01-07 ENCOUNTER — Ambulatory Visit (HOSPITAL_COMMUNITY)
Admission: RE | Admit: 2019-01-07 | Discharge: 2019-01-07 | Disposition: A | Discharge status: Home / Self Care | Payer: 59 | Source: Ambulatory Visit | Attending: Family Medicine | Admitting: Family Medicine

## 2019-01-07 DIAGNOSIS — R1011 Right upper quadrant pain: Secondary | ICD-10-CM | POA: Diagnosis not present

## 2019-01-07 DIAGNOSIS — K760 Fatty (change of) liver, not elsewhere classified: Secondary | ICD-10-CM | POA: Diagnosis not present

## 2019-01-07 DIAGNOSIS — R1010 Upper abdominal pain, unspecified: Secondary | ICD-10-CM | POA: Diagnosis not present

## 2019-02-12 ENCOUNTER — Other Ambulatory Visit: Payer: Self-pay

## 2019-02-12 ENCOUNTER — Encounter: Payer: Self-pay | Admitting: Dietician

## 2019-02-12 ENCOUNTER — Encounter: Payer: 59 | Attending: General Surgery | Admitting: Dietician

## 2019-02-12 DIAGNOSIS — E669 Obesity, unspecified: Secondary | ICD-10-CM | POA: Diagnosis not present

## 2019-02-12 NOTE — Progress Notes (Signed)
Supervised Weight Loss Visit Bariatric Nutrition Education Appt Start Time: 11:55am    End Time: 12:15pm  Planned Surgery: Sleeve   Pt Expectation of Surgery/ Goals: to lose weight and help feet feel better  1st out of 6 SWL Appointments    NUTRITION ASSESSMENT  Anthropometrics  Start weight at [facility]: 298.5 lbs (date: 11/20/2018) Today's weight: 266 lbs Weight change: -32.5 lbs (since previous visit on 11/20/2018) BMI: 47.1 kg/m2    Lifestyle & Dietary Hx Patient states she has greatly increased her water intake to at least 120 ounces per day. States she still eats only 1 meal per day (dinner.) Usually will have chicken with vegetables, states she is trying to cut back on eating pork. Typically does not snack during the day. Reports feeling tired in the morning and lacking energy throughout the day. Patient states she feels like she has acid reflux or heartburn after eating, particularly certain fruits such as strawberries, OJ, and pineapple.   Estimated daily fluid intake: 120-180 oz water  24-Hr Dietary Recall First Meal: - Snack: - Second Meal: - Snack: - Third Meal: chicken + corn + collard greens Snack: - Beverages: water  Estimated Energy Needs Calories: 1600 Carbohydrate: 180g Protein: 100g Fat: 53g   NUTRITION DIAGNOSIS  Overweight/obesity ([place]-3.3) related to past poor dietary habits and physical inactivity as evidenced by patient w/ planned Sleeve Gastrectomy surgery following dietary guidelines for continued weight loss.   NUTRITION INTERVENTION  Nutrition counseling (C-1) and education (E-2) to facilitate bariatric surgery goals.  Pre-Op Goals Progress & New Goals . Drinking >64 ounces of fluid daily.  . NEW: Try to eat at least 3 times per day.  . NEW: Avoid acidic foods, large portions, and laying down after eating to help with acid reflux.   Learning Style & Readiness for Change Teaching method utilized: Visual & Auditory  Demonstrated degree of  understanding via: Teach Back  Barriers to learning/adherence to lifestyle change: None Identified    MONITORING & EVALUATION Dietary intake, weekly physical activity, body weight, and pre-op goals in 1 month.   Next Steps  Patient is to return to [facility] in 1 month for 2nd SWL.

## 2019-02-12 NOTE — Patient Instructions (Addendum)
   Try to eat at least 3 times per day. Use the Balanced Snack sheet for some ideas.   Great job with drinking lots of water!! Keep up the good work there.    See you next month!

## 2019-03-10 DIAGNOSIS — G132 Systemic atrophy primarily affecting the central nervous system in myxedema: Secondary | ICD-10-CM | POA: Diagnosis not present

## 2019-03-10 DIAGNOSIS — I10 Essential (primary) hypertension: Secondary | ICD-10-CM | POA: Diagnosis not present

## 2019-03-10 DIAGNOSIS — F329 Major depressive disorder, single episode, unspecified: Secondary | ICD-10-CM | POA: Diagnosis not present

## 2019-03-10 DIAGNOSIS — E119 Type 2 diabetes mellitus without complications: Secondary | ICD-10-CM | POA: Diagnosis not present

## 2019-03-12 ENCOUNTER — Other Ambulatory Visit: Payer: Self-pay

## 2019-03-12 ENCOUNTER — Encounter: Payer: Self-pay | Admitting: Dietician

## 2019-03-12 ENCOUNTER — Encounter: Payer: 59 | Attending: General Surgery | Admitting: Dietician

## 2019-03-12 DIAGNOSIS — E669 Obesity, unspecified: Secondary | ICD-10-CM | POA: Insufficient documentation

## 2019-03-12 MED FILL — ESCITALOPRAM 10 MG TABLET: 10 | 30 days supply | Qty: 30 | Fill #0

## 2019-03-12 NOTE — Progress Notes (Signed)
Supervised Weight Loss Visit Bariatric Nutrition Education Appt Start Time: 11:55am    End Time: 12:15pm  Planned Surgery: Sleeve   Pt Expectation of Surgery/ Goals: to lose weight and help feet feel better  2nd out of 6 SWL Appointments    NUTRITION ASSESSMENT  Anthropometrics  Start weight at [facility]: 298.5 lbs (date: 11/20/2018) Today's weight: 264.5 lbs BMI: 46.9 kg/m2    Lifestyle & Dietary Hx Patient likes Premier Protein shakes. Working on increasing the number of meals/snacks per day, now up to having breakfast (protein shake), occasionally lunch, and always has a dinner meal. Usually does not have lunch, but may have a wrap with a meat and veggies in it. Likes [employer] milk.   Estimated daily fluid intake: 120-180 oz water  24-Hr Dietary Recall First Meal: Premier Protein shake  Snack: - Second Meal: - Snack: - Third Meal: chicken + corn + collard greens Snack: - Beverages: water  Estimated Energy Needs Calories: 1600 Carbohydrate: 180g Protein: 100g Fat: 53g   NUTRITION DIAGNOSIS  Overweight/obesity ([place]-3.3) related to past poor dietary habits and physical inactivity as evidenced by patient w/ planned Sleeve Gastrectomy surgery following dietary guidelines for continued weight loss.   NUTRITION INTERVENTION  Nutrition counseling (C-1) and education (E-2) to facilitate bariatric surgery goals.  Pre-Op Goals Progress & New Goals . Drinking >64 ounces of fluid daily.  . Trying to eat at least 3 times per day.  . Avoiding acidic foods, large portions, and laying down after eating to help with acid reflux.  . NEW: Practice chewing food thoroughly.  Learning Style & Readiness for Change Teaching method utilized: Visual & Auditory  Demonstrated degree of understanding via: Teach Back  Barriers to learning/adherence to lifestyle change: None Identified    MONITORING & EVALUATION Dietary intake, weekly physical activity, body weight, and pre-op goals in 1 month.    Next Steps  Patient is to return to [facility] in 1 month for 3rd SWL.

## 2019-03-12 NOTE — Patient Instructions (Signed)
Keep up the great work with drinking lots of water throughout the day and working towards eating more frequently! Always aim for eating at least 3 times per day- good job on incorporating more protein, which we know is so important.   Start focusing on chewing your food really thoroughly- chew each bite to "applesauce" consistency before swallowing.

## 2019-03-17 DIAGNOSIS — E139 Other specified diabetes mellitus without complications: Secondary | ICD-10-CM | POA: Diagnosis not present

## 2019-03-17 DIAGNOSIS — I10 Essential (primary) hypertension: Secondary | ICD-10-CM | POA: Diagnosis not present

## 2019-03-17 DIAGNOSIS — E559 Vitamin D deficiency, unspecified: Secondary | ICD-10-CM | POA: Diagnosis not present

## 2019-03-17 DIAGNOSIS — G132 Systemic atrophy primarily affecting the central nervous system in myxedema: Secondary | ICD-10-CM | POA: Diagnosis not present

## 2019-03-17 DIAGNOSIS — E079 Disorder of thyroid, unspecified: Secondary | ICD-10-CM | POA: Diagnosis not present

## 2019-03-17 DIAGNOSIS — E119 Type 2 diabetes mellitus without complications: Secondary | ICD-10-CM | POA: Diagnosis not present

## 2019-03-17 DIAGNOSIS — F329 Major depressive disorder, single episode, unspecified: Secondary | ICD-10-CM | POA: Diagnosis not present

## 2019-03-25 MED FILL — FREESTYLE LANCETS: 90 days supply | Qty: 200 | Fill #0

## 2019-03-25 MED FILL — FREESTYLE LITE METER: 20 days supply | Qty: 1 | Fill #0

## 2019-03-25 MED FILL — FREESTYLE LITE TEST STRIP: 90 days supply | Qty: 200 | Fill #0

## 2019-03-25 MED FILL — ATORVASTATIN 40 MG TABLET: 40 | 90 days supply | Qty: 90 | Fill #0

## 2019-03-25 MED FILL — metFORMIN HCL 1000 MG TABS: 1000 | 90 days supply | Qty: 180 | Fill #0

## 2019-03-25 MED FILL — CARVEDILOL 25 MG TABLET: 25 | 90 days supply | Qty: 180 | Fill #0

## 2019-03-31 MED FILL — OMRON 3 SERIES BP MONITOR D: 1 days supply | Qty: 1 | Fill #0

## 2019-04-09 ENCOUNTER — Encounter: Payer: 59 | Attending: General Surgery | Admitting: Dietician

## 2019-04-09 ENCOUNTER — Other Ambulatory Visit: Payer: Self-pay

## 2019-04-09 ENCOUNTER — Encounter: Payer: Self-pay | Admitting: Dietician

## 2019-04-09 DIAGNOSIS — E669 Obesity, unspecified: Secondary | ICD-10-CM | POA: Diagnosis not present

## 2019-04-09 NOTE — Progress Notes (Signed)
Supervised Weight Loss Visit Bariatric Nutrition Education  Planned Surgery: Sleeve   Pt Expectation of Surgery/ Goals: to lose weight and help feet feel better  3rd out of 6 SWL Appointments    NUTRITION ASSESSMENT  Anthropometrics  Start weight at [facility]: 298.5 lbs (date: 11/20/2018) Today's weight: 255.4 lbs BMI: 45.2 kg/m2    Lifestyle & Dietary Hx Working on increasing the number of meals/snacks per day, now up to having breakfast, occasionally lunch, and always a dinner meal. States blood sugar has been running very high lately (300s and 400s) and that Metformin was increased about 2 weeks ago. We discussed the importance of eating protein with all meals/snacks. Continues to drink lots of water.   Estimated daily fluid intake: 120-180 oz water  24-Hr Dietary Recall First Meal: oatmeal (or Premier Protein shake)  Snack: - Second Meal: sandwich on wheat bread  Snack: - Third Meal: chicken + corn + collard greens Snack: - Beverages: water  Estimated Energy Needs Calories: 1600 Carbohydrate: 180g Protein: 100g Fat: 53g   NUTRITION DIAGNOSIS  Overweight/obesity ([place]-3.3) related to past poor dietary habits and physical inactivity as evidenced by patient w/ planned Sleeve Gastrectomy surgery following dietary guidelines for continued weight loss.   NUTRITION INTERVENTION  Nutrition counseling (C-1) and education (E-2) to facilitate bariatric surgery goals.  Pre-Op Goals Progress & New Goals . Drinking >64 ounces of fluid daily.  . Trying to eat at least 3 times per day.  . Avoiding acidic foods, large portions, and laying down after eating to help with acid reflux.  . Working on chewing food thoroughly.  . NEW: Have protein at all meals and snacks.   Handouts Provided  Balanced Snacks   Learning Style & Readiness for Change Teaching method utilized: Visual & Auditory  Demonstrated degree of understanding via: Teach Back  Barriers to learning/adherence to  lifestyle change: None Identified    MONITORING & EVALUATION Dietary intake, weekly physical activity, body weight, and pre-op goals in 1 month.   Next Steps  Patient is to return to [facility] in 1 month for 4th SWL.

## 2019-04-16 DIAGNOSIS — E119 Type 2 diabetes mellitus without complications: Secondary | ICD-10-CM | POA: Diagnosis not present

## 2019-04-16 DIAGNOSIS — I10 Essential (primary) hypertension: Secondary | ICD-10-CM | POA: Diagnosis not present

## 2019-04-16 DIAGNOSIS — R3915 Urgency of urination: Secondary | ICD-10-CM | POA: Diagnosis not present

## 2019-04-16 MED FILL — VICTOZA 18 MG/3 ML INJECT P: 18 | 30 days supply | Qty: 6 | Fill #0

## 2019-04-20 MED FILL — NOVOFINE 32G NEEDLES: 32G X 6 MM | 90 days supply | Qty: 100 | Fill #0

## 2019-05-04 DIAGNOSIS — Z111 Encounter for screening for respiratory tuberculosis: Secondary | ICD-10-CM | POA: Diagnosis not present

## 2019-05-05 ENCOUNTER — Other Ambulatory Visit (HOSPITAL_COMMUNITY): Payer: Self-pay | Admitting: Family Medicine

## 2019-05-06 ENCOUNTER — Other Ambulatory Visit: Payer: Self-pay

## 2019-05-06 ENCOUNTER — Encounter: Payer: 59 | Admitting: Dietician

## 2019-09-08 ENCOUNTER — Other Ambulatory Visit: Payer: Self-pay

## 2019-09-08 ENCOUNTER — Ambulatory Visit (INDEPENDENT_AMBULATORY_CARE_PROVIDER_SITE_OTHER): Payer: 59 | Admitting: Family Medicine

## 2019-09-08 ENCOUNTER — Encounter: Payer: Self-pay | Admitting: Family Medicine

## 2019-09-08 DIAGNOSIS — Z23 Encounter for immunization: Secondary | ICD-10-CM | POA: Diagnosis not present

## 2019-09-08 DIAGNOSIS — I10 Essential (primary) hypertension: Secondary | ICD-10-CM | POA: Diagnosis not present

## 2019-09-08 DIAGNOSIS — E039 Hypothyroidism, unspecified: Secondary | ICD-10-CM

## 2019-09-08 DIAGNOSIS — E1165 Type 2 diabetes mellitus with hyperglycemia: Secondary | ICD-10-CM | POA: Diagnosis not present

## 2019-09-08 DIAGNOSIS — Z6841 Body Mass Index (BMI) 40.0 and over, adult: Secondary | ICD-10-CM

## 2019-09-08 DIAGNOSIS — F320 Major depressive disorder, single episode, mild: Secondary | ICD-10-CM

## 2019-09-08 DIAGNOSIS — G4733 Obstructive sleep apnea (adult) (pediatric): Secondary | ICD-10-CM

## 2019-09-08 DIAGNOSIS — E785 Hyperlipidemia, unspecified: Secondary | ICD-10-CM | POA: Diagnosis not present

## 2019-09-08 DIAGNOSIS — R87619 Unspecified abnormal cytological findings in specimens from cervix uteri: Secondary | ICD-10-CM | POA: Diagnosis not present

## 2019-09-08 DIAGNOSIS — R5383 Other fatigue: Secondary | ICD-10-CM

## 2019-09-08 MED ORDER — ESCITALOPRAM OXALATE 10 MG PO TABS: 10.0000 mg | ORAL_TABLET | Freq: Every day | ORAL | 0 refills | Status: DC

## 2019-09-08 NOTE — Progress Notes (Signed)
Subjective:  Patient ID: [name], female    DOB: 1980-05-02  Age: 39 y.o. MRN: [id number]  CC:  Chief Complaint  Patient presents with  . New Patient (Initial Visit)    establish care      HPI  HPI   [name] is a 39 year old female patient who is here to establish care today.  She has a extensive history which includes asthma, depression, type 2 diabetes, hyperlipidemia, hypertension, hypothyroidism, thyroid disease, obesity.  She denies having trouble sleeping.  She reports that she feels excessively tired during the day.  She thinks that her gabapentin.  She would like to discuss a possible change in dosage.  She denies having any trouble chewing or swallowing.  Sees a dentist as she can.  Insurance had caused her to have a missed appointment she does have some missing teeth.  But she denies having any pain at this time.  She denies having any trouble going to the bathroom.  No blood in urine or stool.  She denies having any head injuries for confusion.  She denies having any falls.  She denies having any skin issues.  She does have tattoos.  She denies having any hearing trouble.  Denies having any vision trouble.  Reports she has not had an eye exam for diabetes check.  Health maintenance wise she is never had to have a colonoscopy or mammogram.  She does report that the health department 2 months ago told her that she had an abnormal Pap smear.  She has not followed up on this yet.  [facility]  She opts for flu vaccine today.  Has completed her Covid vaccines.  She does report having some depression.  She is ran out of her Lexapro.  She reports that it helped her very well.  She would like to get back on that at this time.  She reports that she has been on the same dosage for hypothyroidism for the last 2 years.  She denies having any signs and symptoms of hypothyroidism today.  She denies having any chest pain, cough, fever, chills,  shortness of breath, headaches or dizziness.  Today patient denies signs and symptoms of COVID 19 infection including fever, chills, cough, shortness of breath, and headache. Past Medical, Surgical, Social History, Allergies, and Medications have been Reviewed.   Past Medical History:  Diagnosis Date  . Arthritis   . Asthma   . 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 Chondromalacia of patellofemoral joint, right   . Depression    Phreesia 09/05/2019  . Diabetes mellitus without complication (HCC)   . Headache   . Hyperlipidemia    Phreesia 09/05/2019  . Hypertension   . Hypothyroidism   . Osteoporosis   . S/P right knee arthroscopy   . Thyroid disease     Current Meds  Medication Sig  . albuterol (VENTOLIN HFA) 108 (90 BASE) MCG/ACT inhaler Inhale 2 puffs into the lungs every 6 (six) hours as needed for wheezing or shortness of breath.  Marland Kitchen atorvastatin (LIPITOR) 40 MG tablet Take 40 mg by mouth daily.  . carvedilol (COREG) 12.5 MG tablet Take 12.5 mg by mouth 2 (two) times daily with a meal.  . Cholecalciferol (VITAMIN D-3) 5000 units TABS Take 5,000 Units by mouth  daily.   Marland Kitchen FREESTYLE LITE test strip 1 each by Other route in the morning and at bedtime.  . Lancets (FREESTYLE) lancets 1 each by Other route in the morning and at bedtime.  Marland Kitchen levothyroxine (SYNTHROID, LEVOTHROID) 25 MCG tablet Take 25 mcg by mouth daily before breakfast.  . metFORMIN (GLUCOPHAGE) 1000 MG tablet Take 1,000 mg by mouth 2 (two) times daily.  Marland Kitchen NOVOFINE 32G X 6 MM MISC 1 each by Other route See admin instructions. For Victoza pen  . VICTOZA 18 MG/3ML SOPN Inject 1.2 mg into the skin daily.    ROS:  Review of Systems  Constitutional: Positive for malaise/fatigue.  HENT: Negative.   Eyes: Negative.    Respiratory: Negative.   Cardiovascular: Negative.   Gastrointestinal: Negative.   Genitourinary: Negative.   Musculoskeletal: Negative.   Skin: Negative.   Neurological: Negative.   Endo/Heme/Allergies: Negative.   Psychiatric/Behavioral: Positive for depression.     Objective:   Today's Vitals: BP 135/88   Pulse 83   Resp 16   Ht 5\' 3"  (1.6 m)   Wt 265 lb 1.9 oz (120.3 kg)   SpO2 97%   BMI 46.96 kg/m  Vitals with BMI 09/08/2019 04/09/2019 03/12/2019  Height 5\' 3"  - -  Weight 265 lbs 2 oz 255 lbs 6 oz 264 lbs 8 oz  BMI 46.98 - -  Systolic 135 - -  Diastolic 88 - -  Pulse 83 - -     Physical Exam Vitals and nursing note reviewed.  Constitutional:      Appearance: Normal appearance. She is well-developed and well-groomed. She is morbidly obese.  HENT:     Head: Normocephalic and atraumatic.     Right Ear: External ear normal.     Left Ear: External ear normal.     Mouth/Throat:     Comments: Mask in place  Eyes:     General:        Right eye: No discharge.        Left eye: No discharge.     Conjunctiva/sclera: Conjunctivae normal.  Cardiovascular:     Rate and Rhythm: Normal rate and regular rhythm.     Pulses: Normal pulses.     Heart sounds: Normal heart sounds.  Pulmonary:     Effort: Pulmonary effort is normal.     Breath sounds: Normal breath sounds.  Musculoskeletal:        General: Normal range of motion.     Cervical back: Normal range of motion and neck supple.  Skin:    General: Skin is warm.  Neurological:     General: No focal deficit present.     Mental Status: She is alert and oriented to person, place, and time.  Psychiatric:        Attention and Perception: Attention normal.        Mood and Affect: Mood normal.        Speech: Speech normal.        Behavior: Behavior normal. Behavior is cooperative.        Thought Content: Thought content normal.        Cognition and Memory: Cognition normal.        Judgment: Judgment normal.      Comments: Good eye contact, pleasant in communication     Depression screen [facility] 2/9 09/08/2019 09/08/2019 11/20/2018  Decreased Interest 0 0 0  Down, Depressed, Hopeless 1 1 0  PHQ - 2 Score 1 1 0  Altered sleeping 3 - -  Tired, decreased energy 2 - -  Change in appetite 3 - -  Feeling bad or failure about yourself  0 - -  Trouble concentrating 0 - -  Moving slowly or fidgety/restless 0 - -  Suicidal thoughts 0 - -  PHQ-9 Score 9 - -      Assessment   1. Morbid obesity with body mass index (BMI) of 45.0 to 49.9 in adult [facility])   2. Depression, major, single episode, mild (HCC)   3. Type 2 diabetes mellitus with hyperglycemia, without long-term current use of insulin (HCC)   4. Need for immunization against influenza   5. OSA (obstructive sleep apnea)   6. Essential hypertension   7. Hyperlipidemia, unspecified hyperlipidemia type   8. Hypothyroidism, unspecified type   9. Abnormal cervical Papanicolaou smear, unspecified abnormal pap finding   10. Other fatigue     Tests ordered Orders Placed This Encounter  Procedures  . Flu Vaccine QUAD 36+ mos IM  . CBC  . Comprehensive metabolic panel  . Lipid panel  . TSH  . VITAMIN D 25 Hydroxy (Vit-D Deficiency, Fractures)  . Hemoglobin A1c  . Ambulatory referral to Obstetrics / Gynecology  . Ambulatory referral to Ophthalmology     Plan: Please see assessment and plan per problem list above.   Meds ordered this encounter  Medications  . escitalopram (LEXAPRO) 10 MG tablet    Sig: Take 1 tablet (10 mg total) by mouth daily.    Dispense:  90 tablet    Refill:  0    Order Specific Question:   Supervising Provider    Answer:   Kerri Perches [[id number]]    Patient to follow-up in 3 months  Freddy Finner, NP

## 2019-09-08 NOTE — Assessment & Plan Note (Signed)
Is currently on Lipitor.  Denies having any myalgias.  Continue current dose we will get updated labs in the near future.

## 2019-09-08 NOTE — Assessment & Plan Note (Signed)
Does have some fatigue.  Questionable whether is medication/diabetic related.  Will be checking vitamin D and her thyroid level.

## 2019-09-08 NOTE — Assessment & Plan Note (Signed)
[name] is encouraged to check blood sugar daily as directed. Continue current medications. Is on statin as well. Educated on importance of maintain a well balanced diabetic friendly diet.   She is reminded the importance of maintaining  good blood sugars,  taking medications as directed, daily foot care, annual eye exams. Additionally educated about keeping good control over blood pressure and cholesterol as well.

## 2019-09-08 NOTE — Assessment & Plan Note (Signed)
Denies having any SI or HI.  Reports that she ran out of her Lexapro would like to get a refill of that today.

## 2019-09-08 NOTE — Assessment & Plan Note (Signed)
Obesity is linked to hypertension, hyperlipidemia, depression, type 2 diabetes [name] is educated about the importance of exercise daily to help with weight management. A minumum of 30 minutes daily is recommended. Additionally, importance of healthy food choices  with portion control discussed. Wt Readings from Last 3 Encounters:  09/08/19 265 lb 1.9 oz (120.3 kg)  04/09/19 255 lb 6.4 oz (115.8 kg)  03/12/19 264 lb 8 oz (120 kg)

## 2019-09-08 NOTE — Assessment & Plan Note (Signed)
Is currently taking Coreg.  Blood pressure is a little bit elevated today she reports she is little bit nervous coming in today.  She denies having any headaches or vision changes.  Encouraged DASH diet and 30 to 60 minutes of physical exercise at least 5 days of the week.

## 2019-09-08 NOTE — Assessment & Plan Note (Signed)
She reports that she sees Dr. Craige Cotta for her obstructive sleep apnea and asthma.  Does not use her CPAP machine as she should.  She is encouraged to make sure that she follows up and uses her sleep apnea machine/CPAP so that she can have good lung and heart health.

## 2019-09-08 NOTE — Assessment & Plan Note (Signed)

## 2019-09-08 NOTE — Patient Instructions (Addendum)
I appreciate the opportunity to provide you with care for your health and wellness. Today we discussed:   Follow up: 3 months  Labs: fasting within the next day to week Referrals today: GYN and Eye dr for Diabetic eye exam  Start taking gabapentin 100 mg at night, if not enough take 200 mg at night and avoid the daily time dose due to it making you sleep.  Restart Lexapro 10 mg daily  Please continue to practice social distancing to keep you, your family, and our community safe.  If you must go out, please wear a mask and practice good handwashing.  It was a pleasure to see you and I look forward to continuing to work together on your health and well-being. Please do not hesitate to call the office if you need care or have questions about your care.  Have a wonderful day and week. With Gratitude, Tereasa Coop, DNP, AGNP-BC

## 2019-09-08 NOTE — Assessment & Plan Note (Signed)
She needs to get updated labs, these will be ordered today.  She denies have any signs or symptoms of uncontrolled thyroid at this time.  She reports she has been on the same dose for 2 years.  Adjustment will be based off of labs.

## 2019-09-08 NOTE — Assessment & Plan Note (Signed)
Abnormal Pap smear at the health department.  [facility] today.  Denies having any abnormal bleeding or pain with sex.

## 2019-09-09 ENCOUNTER — Telehealth: Payer: Self-pay

## 2019-09-09 DIAGNOSIS — I10 Essential (primary) hypertension: Secondary | ICD-10-CM | POA: Diagnosis not present

## 2019-09-09 DIAGNOSIS — E1165 Type 2 diabetes mellitus with hyperglycemia: Secondary | ICD-10-CM | POA: Diagnosis not present

## 2019-09-09 DIAGNOSIS — E785 Hyperlipidemia, unspecified: Secondary | ICD-10-CM | POA: Diagnosis not present

## 2019-09-09 DIAGNOSIS — E039 Hypothyroidism, unspecified: Secondary | ICD-10-CM | POA: Diagnosis not present

## 2019-09-09 DIAGNOSIS — R5383 Other fatigue: Secondary | ICD-10-CM | POA: Diagnosis not present

## 2019-09-09 NOTE — Telephone Encounter (Signed)
Pt will need to sign a release so we can request this from the health dept please call pt and advise her to come by and sign this.

## 2019-09-09 NOTE — Telephone Encounter (Signed)
Angie with [facility] called about the referral placed for the pt wondering if we had a copy of the abnormal PAP so they know how to schedule the appointment

## 2019-09-10 LAB — COMPREHENSIVE METABOLIC PANEL
ALT: 22 IU/L (ref 0–32)
AST: 35 IU/L (ref 0–40)
Albumin/Globulin Ratio: 1.6 (ref 1.2–2.2)
Albumin: 4.5 g/dL (ref 3.8–4.8)
Alkaline Phosphatase: 95 IU/L (ref 48–121)
BUN/Creatinine Ratio: 13 (ref 9–23)
BUN: 9 mg/dL (ref 6–20)
Bilirubin Total: 0.7 mg/dL (ref 0.0–1.2)
CO2: 25 mmol/L (ref 20–29)
Calcium: 10.1 mg/dL (ref 8.7–10.2)
Chloride: 103 mmol/L (ref 96–106)
Creatinine, Ser: 0.68 mg/dL (ref 0.57–1.00)
GFR calc Af Amer: 130 mL/min/{1.73_m2} (ref >59)
GFR calc non Af Amer: 113 mL/min/{1.73_m2} (ref >59)
Globulin, Total: 2.8 g/dL (ref 1.5–4.5)
Glucose: 109 mg/dL — ABNORMAL HIGH (ref 65–99)
Potassium: 4.3 mmol/L (ref 3.5–5.2)
Sodium: 140 mmol/L (ref 134–144)
Total Protein: 7.3 g/dL (ref 6.0–8.5)

## 2019-09-10 LAB — CBC
Hematocrit: 38.5 % (ref 34.0–46.6)
Hemoglobin: 12.2 g/dL (ref 11.1–15.9)
MCH: 23.5 pg — ABNORMAL LOW (ref 26.6–33.0)
MCHC: 31.7 g/dL (ref 31.5–35.7)
MCV: 74 fL — ABNORMAL LOW (ref 79–97)
Platelets: 291 10*3/uL (ref 150–450)
RBC: 5.19 x10E6/uL (ref 3.77–5.28)
RDW: 15.6 % — ABNORMAL HIGH (ref 11.7–15.4)
WBC: 10.9 10*3/uL — ABNORMAL HIGH (ref 3.4–10.8)

## 2019-09-10 LAB — LIPID PANEL
Chol/HDL Ratio: 4.8 ratio — ABNORMAL HIGH (ref 0.0–4.4)
Cholesterol, Total: 192 mg/dL (ref 100–199)
HDL: 40 mg/dL (ref >39)
LDL Chol Calc (NIH): 138 mg/dL — ABNORMAL HIGH (ref 0–99)
Triglycerides: 74 mg/dL (ref 0–149)
VLDL Cholesterol Cal: 14 mg/dL (ref 5–40)

## 2019-09-10 LAB — HEMOGLOBIN A1C
Est. average glucose Bld gHb Est-mCnc: 189 mg/dL
Hgb A1c MFr Bld: 8.2 % — ABNORMAL HIGH (ref 4.8–5.6)

## 2019-09-10 LAB — TSH: TSH: 1.76 u[IU]/mL (ref 0.450–4.500)

## 2019-09-10 LAB — VITAMIN D 25 HYDROXY (VIT D DEFICIENCY, FRACTURES): Vit D, 25-Hydroxy: 16.1 ng/mL — ABNORMAL LOW (ref 30.0–100.0)

## 2019-09-11 ENCOUNTER — Other Ambulatory Visit: Payer: Self-pay | Admitting: Family Medicine

## 2019-09-11 DIAGNOSIS — E1165 Type 2 diabetes mellitus with hyperglycemia: Secondary | ICD-10-CM

## 2019-09-11 DIAGNOSIS — E785 Hyperlipidemia, unspecified: Secondary | ICD-10-CM

## 2019-09-11 DIAGNOSIS — E559 Vitamin D deficiency, unspecified: Secondary | ICD-10-CM

## 2019-09-11 MED ORDER — ATORVASTATIN CALCIUM 80 MG PO TABS: 80.0000 mg | ORAL_TABLET | Freq: Every day | ORAL | 3 refills | Status: DC

## 2019-09-11 MED ORDER — VITAMIN D (ERGOCALCIFEROL) 1.25 MG (50000 UNIT) PO CAPS: 50000.0000 [IU] | ORAL_CAPSULE | ORAL | 1 refills | Status: DC

## 2019-09-11 MED FILL — ATORVASTATIN 80 MG TABLET: 80 | 30 days supply | Qty: 30 | Fill #0

## 2019-09-11 MED FILL — VIT D2 1.25 MG (50,000 UNIT: 1.25 MG | 84 days supply | Qty: 12 | Fill #0

## 2019-10-08 ENCOUNTER — Other Ambulatory Visit: Payer: Self-pay

## 2019-10-08 ENCOUNTER — Encounter: Payer: Self-pay | Admitting: Internal Medicine

## 2019-10-08 ENCOUNTER — Ambulatory Visit (INDEPENDENT_AMBULATORY_CARE_PROVIDER_SITE_OTHER): Payer: 59 | Admitting: Internal Medicine

## 2019-10-08 VITALS — BP 142/77 | HR 86 | Temp 97.1°F | Resp 18 | Ht 63.0 in | Wt 269.4 lb

## 2019-10-08 DIAGNOSIS — E039 Hypothyroidism, unspecified: Secondary | ICD-10-CM

## 2019-10-08 DIAGNOSIS — E1165 Type 2 diabetes mellitus with hyperglycemia: Secondary | ICD-10-CM | POA: Diagnosis not present

## 2019-10-08 DIAGNOSIS — Z6841 Body Mass Index (BMI) 40.0 and over, adult: Secondary | ICD-10-CM | POA: Diagnosis not present

## 2019-10-08 DIAGNOSIS — I10 Essential (primary) hypertension: Secondary | ICD-10-CM

## 2019-10-08 DIAGNOSIS — G4733 Obstructive sleep apnea (adult) (pediatric): Secondary | ICD-10-CM | POA: Diagnosis not present

## 2019-10-08 DIAGNOSIS — E785 Hyperlipidemia, unspecified: Secondary | ICD-10-CM

## 2019-10-08 MED ORDER — LOSARTAN POTASSIUM 50 MG PO TABS: 50.0000 mg | ORAL_TABLET | Freq: Every day | ORAL | 2 refills | Status: DC

## 2019-10-08 MED FILL — LOSARTAN POTASSIUM 50 MG TA: 50 | 30 days supply | Qty: 30 | Fill #0

## 2019-10-08 NOTE — Progress Notes (Signed)
Established Patient Office Visit  Subjective:  Patient ID: [name], female    DOB: 1980-10-31  Age: 39 y.o. MRN: [id number]  CC:  Chief Complaint  Patient presents with  . Acute Visit    bp follow up was high yesterday 168/98 had phone visit with insurance and they told her to follow up with primary   . Headache    having migraines started yesterday gets worse as the day goes on takes ibuprofen but this doesnt help at all     HPI [name] is a 39 year old female with past medical history of type II DM, OSA on CPAP, hypothyroidism, depression and morbid obesity presents for evaluation of her elevated blood pressure at home. Patient had a televisit with her insurance, her BP was 168/98 at home yesterday and was advised to follow-up with primary care. Patient has been having headache since yesterday, for which he tried ibuprofen, but did not help much. Patient denies dizziness, vision changes, chest pain, palpitations, or dyspnea.  Today, patient's BP is 142/77 in the office. Patient takes her Coreg regularly. Of note, patient states that she is not compliant with her CPAP for OSA. Patient was advised to use CPAP regularly for better control of blood pressure and to avoid further complications, patient expressed understanding and agrees to use it regularly.  Past Medical History:  Diagnosis Date  . Arthritis   . Asthma   . 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 Chondromalacia of patellofemoral joint, right   . Depression    Phreesia 09/05/2019  . Diabetes mellitus without complication (HCC)   . Headache   . Hyperlipidemia    Phreesia 09/05/2019  . Hypertension   . Hypothyroidism   . Osteoporosis   . S/P right knee arthroscopy   . Thyroid disease     Past Surgical  History:  Procedure Laterality Date  . extra digit removed Left   . KNEE ARTHROSCOPY WITH MEDIAL MENISECTOMY Right 08/02/2016   Procedure: chondroplasty;  Surgeon: Vickki Hearing, MD;  Location: [facility];  Service: Orthopedics;  Laterality: Right;    Family History  Problem Relation Age of Onset  . Hypothyroidism Mother   . Hypertension Other     Social History   Socioeconomic History  . Marital status: Married    Spouse name: Artricias  . Number of children: Not on file  . Years of education: Not on file  . Highest education level: High school graduate  Occupational History  . Not on file  Tobacco Use  . Smoking status: Current Every Day Smoker    Packs/day: 1.50    Years: 13.00    Pack years: 19.50    Types: Cigarettes  . Smokeless tobacco: Never Used  Vaping Use  . Vaping Use: Never used  Substance and Sexual Activity  . Alcohol use: Yes    Comment: Social drinker   . Drug use: No  . Sexual activity: Yes    Partners: Female    Birth control/protection: None  Other Topics Concern  . Not on file  Social History Narrative   Lives with wife [name] of 5 years    Lives at in laws      Enjoys: crafting-painting-circut       Diet: eats all food groups    Caffeine: soda- 1-2 cans daily  Water: 1-2 cups daily      Wears seat belt    Does not use phone while driving    Psychologist, sport and exercisemoke detectors at home   Fire extinguish    No weapons at home   Social Determinants of Health   Financial Resource Strain: Medium Risk  . Difficulty of Paying Living Expenses: Somewhat hard  Food Insecurity: No Food Insecurity  . Worried About Programme researcher, broadcasting/film/videounning Out of Food in the Last Year: Never true  . Ran Out of Food in the Last Year: Never true  Transportation Needs: No Transportation Needs  . Lack of Transportation (Medical): No  . Lack of Transportation (Non-Medical): No  Physical Activity: Inactive  . Days of Exercise per Week: 0 days  . Minutes of Exercise per Session: 0 min  Stress:  Stress Concern Present  . Feeling of Stress : To some extent  Social Connections: Moderately Isolated  . Frequency of Communication with Friends and Family: More than three times a week  . Frequency of Social Gatherings with Friends and Family: Never  . Attends Religious Services: Never  . Active Member of Clubs or Organizations: No  . Attends BankerClub or Organization Meetings: Never  . Marital Status: Married  Catering managerntimate Partner Violence: Not At Risk  . Fear of Current or Ex-Partner: No  . Emotionally Abused: No  . Physically Abused: No  . Sexually Abused: No    Outpatient Medications Prior to Visit  Medication Sig Dispense Refill  . albuterol (VENTOLIN HFA) 108 (90 BASE) MCG/ACT inhaler Inhale 2 puffs into the lungs every 6 (six) hours as needed for wheezing or shortness of breath.    Marland Kitchen. atorvastatin (LIPITOR) 80 MG tablet Take 1 tablet (80 mg total) by mouth daily. 30 tablet 3  . carvedilol (COREG) 12.5 MG tablet Take 12.5 mg by mouth 2 (two) times daily with a meal.    . escitalopram (LEXAPRO) 10 MG tablet Take 1 tablet (10 mg total) by mouth daily. 90 tablet 0  . FREESTYLE LITE test strip 1 each by Other route in the morning and at bedtime.    . Lancets (FREESTYLE) lancets 1 each by Other route in the morning and at bedtime.    Marland Kitchen. levothyroxine (SYNTHROID, LEVOTHROID) 25 MCG tablet Take 25 mcg by mouth daily before breakfast.    . metFORMIN (GLUCOPHAGE) 1000 MG tablet Take 1,000 mg by mouth 2 (two) times daily.    Marland Kitchen. NOVOFINE 32G X 6 MM MISC 1 each by Other route See admin instructions. For Victoza pen    . VICTOZA 18 MG/3ML SOPN Inject 1.2 mg into the skin daily.    . Vitamin D, Ergocalciferol, (DRISDOL) 1.25 MG (50000 UNIT) CAPS capsule Take 1 capsule (50,000 Units total) by mouth every 7 (seven) days. 12 capsule 1  . Cholecalciferol (VITAMIN D-3) 5000 units TABS Take 5,000 Units by mouth daily.  (Patient not taking: Reported on 10/08/2019)     No facility-administered medications prior  to visit.    No Known Allergies  ROS Review of Systems  Constitutional: Negative for chills and fever.  HENT: Negative for congestion, sinus pressure, sinus pain and sore throat.   Eyes: Negative for pain and discharge.  Respiratory: Negative for cough and shortness of breath.   Cardiovascular: Negative for chest pain and palpitations.  Gastrointestinal: Negative for abdominal pain, constipation, diarrhea, nausea and vomiting.  Endocrine: Negative for polydipsia and polyuria.  Genitourinary: Negative for dysuria and hematuria.  Musculoskeletal: Negative for neck pain and neck stiffness.  Skin: Negative for rash.  Neurological: Positive for headaches. Negative for dizziness and weakness.  Psychiatric/Behavioral: Negative for agitation and behavioral problems.      Objective:    Physical Exam Vitals reviewed.  Constitutional:      General: She is not in acute distress.    Appearance: She is obese. She is not diaphoretic.  HENT:     Head: Normocephalic and atraumatic.     Nose: Nose normal. No congestion.     Mouth/Throat:     Mouth: Mucous membranes are moist.     Pharynx: No posterior oropharyngeal erythema.  Eyes:     General: No scleral icterus.    Extraocular Movements: Extraocular movements intact.     Pupils: Pupils are equal, round, and reactive to light.  Cardiovascular:     Rate and Rhythm: Normal rate and regular rhythm.     Heart sounds: Normal heart sounds. No murmur heard.   Pulmonary:     Breath sounds: Normal breath sounds. No wheezing or rales.  Abdominal:     Palpations: Abdomen is soft.     Tenderness: There is no abdominal tenderness.  Musculoskeletal:     Cervical back: Neck supple. No tenderness.     Right lower leg: No edema.     Left lower leg: No edema.  Skin:    General: Skin is warm.     Findings: No rash.  Neurological:     General: No focal deficit present.     Mental Status: She is alert and oriented to person, place, and time.   Psychiatric:        Mood and Affect: Mood normal.        Behavior: Behavior normal.     BP (!) 142/77 (BP Location: Right Arm, Patient Position: Sitting, Cuff Size: Normal)   Pulse 86   Temp (!) 97.1 F (36.2 C) (Temporal)   Resp 18   Ht 5\' 3"  (1.6 m)   Wt 269 lb 6.4 oz (122.2 kg)   SpO2 98%   BMI 47.72 kg/m  Wt Readings from Last 3 Encounters:  10/08/19 269 lb 6.4 oz (122.2 kg)  09/08/19 265 lb 1.9 oz (120.3 kg)  04/09/19 255 lb 6.4 oz (115.8 kg)     Health Maintenance Due  Topic Date Due  . Hepatitis C Screening  Never done  . PNEUMOCOCCAL POLYSACCHARIDE VACCINE AGE 53-64 HIGH RISK  Never done  . FOOT EXAM  Never done  . OPHTHALMOLOGY EXAM  Never done  . URINE MICROALBUMIN  Never done  . HIV Screening  Never done  . TETANUS/TDAP  Never done  . PAP SMEAR-Modifier  04/30/2016    There are no preventive care reminders to display for this patient.  Lab Results  Component Value Date   TSH 1.760 09/09/2019   Lab Results  Component Value Date   WBC 10.9 (H) 09/09/2019   HGB 12.2 09/09/2019   HCT 38.5 09/09/2019   MCV 74 (L) 09/09/2019   PLT 291 09/09/2019   Lab Results  Component Value Date   NA 140 09/09/2019   K 4.3 09/09/2019   CO2 25 09/09/2019   GLUCOSE 109 (H) 09/09/2019   BUN 9 09/09/2019   CREATININE 0.68 09/09/2019   BILITOT 0.7 09/09/2019   ALKPHOS 95 09/09/2019   AST 35 09/09/2019   ALT 22 09/09/2019   PROT 7.3 09/09/2019   ALBUMIN 4.5 09/09/2019   CALCIUM 10.1 09/09/2019   ANIONGAP 10 11/27/2018   Lab Results  Component  Value Date   CHOL 192 09/09/2019   Lab Results  Component Value Date   HDL 40 09/09/2019   Lab Results  Component Value Date   LDLCALC 138 (H) 09/09/2019   Lab Results  Component Value Date   TRIG 74 09/09/2019   Lab Results  Component Value Date   CHOLHDL 4.8 (H) 09/09/2019   Lab Results  Component Value Date   HGBA1C 8.2 (H) 09/09/2019      Assessment & Plan:   Problem List Items Addressed This  Visit      Cardiovascular and Mediastinum   Essential hypertension    BP: 142/77 today in the office Patient mentions headache since yesterday, denies chest pain, palpitations, dyspnea or vision changes On Coreg 12.5 mg BID Will start Losartan 50 mg considering h/o DM Patient is advised to follow low-salt diet and moderate exercise Advised to comply with CPAP use, agrees to use it regularly      Relevant Medications   losartan (COZAAR) 50 MG tablet   Other Relevant Orders   CBC   Basic Metabolic Panel (BMET)     Respiratory   OSA (obstructive sleep apnea)    Noncompliant with CPAP use Advised to use it regularly to avoid complications, expressed understanding        Endocrine   Type 2 diabetes mellitus with hyperglycemia, without long-term current use of insulin (HCC) - Primary    HbA1C: 8.2 (08/2019)  On Victoza and Metformin Advised to follow diabetic diet and check blood glucose regularly Weight loss would be effective for her DM management, patient expressed understanding. Moderate exercise as tolerated On Atorvastatin, started Losartan today F/u BMP      Relevant Medications   losartan (COZAAR) 50 MG tablet   Other Relevant Orders   Microalbumin, urine   Hypothyroidism    On Levothyroxine 25 mcg QD        Other   Morbid obesity with body mass index (BMI) of 45.0 to 49.9 in adult [facility])   Hyperlipidemia    On Atorvastatin 80 mg QD      Relevant Medications   losartan (COZAAR) 50 MG tablet      Meds ordered this encounter  Medications  . losartan (COZAAR) 50 MG tablet    Sig: Take 1 tablet (50 mg total) by mouth daily.    Dispense:  30 tablet    Refill:  2    Follow-up: Return in about 3 weeks (around 10/29/2019), or if symptoms worsen or fail to improve.    Anabel Halon, MD

## 2019-10-08 NOTE — Assessment & Plan Note (Addendum)
HbA1C: 8.2 (08/2019)  On Victoza and Metformin Advised to follow diabetic diet and check blood glucose regularly Weight loss would be effective for her DM management, patient expressed understanding. Moderate exercise as tolerated On Atorvastatin, started Losartan today F/u BMP

## 2019-10-08 NOTE — Patient Instructions (Addendum)
You are advised to use CPAP regularly.  Please start taking Losartan 50 mg once daily. Please get blood tests done after 2 weeks.  Please get immediate medical attention If BP is more than 160/100 or have chest pain, shortness of breath, palpitations, dizziness or vision changes.  Please continue to check blood pressure and blood sugar at home. Keep a log of them and bring it next visit.  Continue diabetic diet with low-salt. Moderate exercise as tolerated.

## 2019-10-08 NOTE — Assessment & Plan Note (Signed)
BP: 142/77 today in the office Patient mentions headache since yesterday, denies chest pain, palpitations, dyspnea or vision changes On Coreg 12.5 mg BID Will start Losartan 50 mg considering h/o DM Patient is advised to follow low-salt diet and moderate exercise Advised to comply with CPAP use, agrees to use it regularly

## 2019-10-08 NOTE — Assessment & Plan Note (Signed)
On Levothyroxine 25 mcg QD 

## 2019-10-08 NOTE — Assessment & Plan Note (Signed)
On Atorvastatin 80 mg QD ?

## 2019-10-08 NOTE — Assessment & Plan Note (Addendum)
Noncompliant with CPAP use Advised to use it regularly to avoid complications, expressed understanding Advised to quit smoking, discussed of potential risks of smoking in her condition and provided medical options to help quit smoking. Patient willing to continue to cut down on smoking by herself. Encouraged to quit smoking.

## 2019-10-09 ENCOUNTER — Other Ambulatory Visit (HOSPITAL_COMMUNITY)
Admission: RE | Admit: 2019-10-09 | Discharge: 2019-10-09 | Disposition: A | Discharge status: Home / Self Care | Payer: 59 | Source: Other Acute Inpatient Hospital | Attending: Family Medicine | Admitting: Family Medicine

## 2019-10-09 DIAGNOSIS — E1165 Type 2 diabetes mellitus with hyperglycemia: Secondary | ICD-10-CM | POA: Insufficient documentation

## 2019-10-10 LAB — MICROALBUMIN, URINE: Microalb, Ur: <3 ug/mL — ABNORMAL HIGH

## 2019-10-19 DIAGNOSIS — I10 Essential (primary) hypertension: Secondary | ICD-10-CM | POA: Diagnosis not present

## 2019-10-19 MED FILL — CARVEDILOL 25 MG TABLET: 25 | 90 days supply | Qty: 180 | Fill #1

## 2019-10-19 MED FILL — FREESTYLE LANCETS: 90 days supply | Qty: 200 | Fill #1

## 2019-10-19 MED FILL — VICTOZA 18 MG/3 ML INJECT P: 18 | 90 days supply | Qty: 18 | Fill #1

## 2019-10-19 MED FILL — METFORMIN HCL 1000 MG TABS: 1000 | 90 days supply | Qty: 180 | Fill #1

## 2019-10-19 MED FILL — FREESTYLE LITE TEST STRIP: 90 days supply | Qty: 200 | Fill #1

## 2019-10-19 MED FILL — ATORVASTATIN 80 MG TABLET: 80 | 30 days supply | Qty: 30 | Fill #1

## 2019-10-19 MED FILL — ESCITALOPRAM 10 MG TABLET: 10 | 30 days supply | Qty: 30 | Fill #1

## 2019-10-20 LAB — CBC
Hematocrit: 42.6 % (ref 34.0–46.6)
Hemoglobin: 12.7 g/dL (ref 11.1–15.9)
MCH: 22.4 pg — ABNORMAL LOW (ref 26.6–33.0)
MCHC: 29.8 g/dL — ABNORMAL LOW (ref 31.5–35.7)
MCV: 75 fL — ABNORMAL LOW (ref 79–97)
Platelets: 262 10*3/uL (ref 150–450)
RBC: 5.67 x10E6/uL — ABNORMAL HIGH (ref 3.77–5.28)
RDW: 15.7 % — ABNORMAL HIGH (ref 11.7–15.4)
WBC: 10.2 10*3/uL (ref 3.4–10.8)

## 2019-10-20 LAB — BASIC METABOLIC PANEL
BUN/Creatinine Ratio: 13 (ref 9–23)
BUN: 8 mg/dL (ref 6–20)
CO2: 25 mmol/L (ref 20–29)
Calcium: 9.8 mg/dL (ref 8.7–10.2)
Chloride: 100 mmol/L (ref 96–106)
Creatinine, Ser: 0.63 mg/dL (ref 0.57–1.00)
GFR calc Af Amer: 133 mL/min/{1.73_m2} (ref >59)
GFR calc non Af Amer: 116 mL/min/{1.73_m2} (ref >59)
Glucose: 105 mg/dL — ABNORMAL HIGH (ref 65–99)
Potassium: 4.3 mmol/L (ref 3.5–5.2)
Sodium: 146 mmol/L — ABNORMAL HIGH (ref 134–144)

## 2019-10-21 ENCOUNTER — Other Ambulatory Visit (HOSPITAL_COMMUNITY): Payer: Self-pay | Admitting: Family

## 2019-10-21 MED FILL — UNIFINE PENTIPS 32G X 6 MM: 32G X 6 MM | 90 days supply | Qty: 100 | Fill #0

## 2019-10-29 ENCOUNTER — Encounter: Payer: Self-pay | Admitting: Internal Medicine

## 2019-10-29 ENCOUNTER — Other Ambulatory Visit: Payer: Self-pay

## 2019-10-29 ENCOUNTER — Ambulatory Visit (INDEPENDENT_AMBULATORY_CARE_PROVIDER_SITE_OTHER): Payer: 59 | Admitting: Internal Medicine

## 2019-10-29 VITALS — BP 127/83 | HR 75 | Temp 97.0°F | Resp 18 | Ht 63.0 in | Wt 264.4 lb

## 2019-10-29 DIAGNOSIS — I10 Essential (primary) hypertension: Secondary | ICD-10-CM | POA: Diagnosis not present

## 2019-10-29 DIAGNOSIS — N912 Amenorrhea, unspecified: Secondary | ICD-10-CM

## 2019-10-29 DIAGNOSIS — Z124 Encounter for screening for malignant neoplasm of cervix: Secondary | ICD-10-CM | POA: Diagnosis not present

## 2019-10-29 DIAGNOSIS — E1165 Type 2 diabetes mellitus with hyperglycemia: Secondary | ICD-10-CM

## 2019-10-29 NOTE — Patient Instructions (Signed)
Please continue to take current medications as prescribed. Your blood pressure and Diabetes are better controlled now.  Please continue to follow diabetic diet.  Please check BP in the morning around 8 AM if possible. Please continue to blood sugar twice in a day.  Please continue to use CPAP night.

## 2019-10-29 NOTE — Progress Notes (Signed)
Established Patient Office Visit  Subjective:  Patient ID: [name], female    DOB: 03-31-1980  Age: 39 y.o. MRN: [id number]  CC:  Chief Complaint  Patient presents with  . Follow-up    3 week follow up has been keeping up with bp at home has log today pt states its higher in the am than at night but she is taking her meds like supposed to and she is able to split gabapentin now since its in her system     HPI [name] is a 39 year old female with PMH of DM, HTN, HLD, depression and morbid obesity presents for evaluation of her home BP readings and blood glucose levels. Home BP readings are mostly around 120s/70s, except few instances of 140s/80s. High BP readings were in the morning, right after waking up before the medication intake. Patient denies headache, dizziness, chest pain, dyspnea or palpitations.  Blood glucose readings are around 100-110. Patient takes Victoza and Metformin regularly.  Past Medical History:  Diagnosis Date  . Arthritis   . Asthma   . 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 Chondromalacia of patellofemoral joint, right   . Depression    Phreesia 09/05/2019  . Diabetes mellitus without complication (HCC)   . Headache   . Hyperlipidemia    Phreesia 09/05/2019  . Hypertension   . Hypothyroidism   . Osteoporosis   . S/P right knee arthroscopy   . Thyroid disease     Past Surgical History:  Procedure Laterality Date  . extra digit removed Left   . KNEE ARTHROSCOPY WITH MEDIAL MENISECTOMY Right 08/02/2016   Procedure: chondroplasty;  Surgeon: Vickki Hearing, MD;  Location: [facility];  Service: Orthopedics;  Laterality: Right;    Family History  Problem Relation Age of Onset  . Hypothyroidism Mother   . Hypertension Other     Social  History   Socioeconomic History  . Marital status: Married    Spouse name: Artricias  . Number of children: Not on file  . Years of education: Not on file  . Highest education level: High school graduate  Occupational History  . Not on file  Tobacco Use  . Smoking status: Current Every Day Smoker    Packs/day: 1.50    Years: 13.00    Pack years: 19.50    Types: Cigarettes  . Smokeless tobacco: Never Used  Vaping Use  . Vaping Use: Never used  Substance and Sexual Activity  . Alcohol use: Yes    Comment: Social drinker   . Drug use: No  . Sexual activity: Yes    Partners: Female    Birth control/protection: None  Other Topics Concern  . Not on file  Social History Narrative   Lives with wife Shela Nevin of 5 years    Lives at in laws      Enjoys: crafting-painting-circut       Diet: eats all food groups    Caffeine: soda- 1-2 cans daily   Water: 1-2 cups daily      Wears seat belt    Does not use phone while driving    Smoke detectors at home   Fire extinguish    No weapons at home   Social Determinants of Health   Financial Resource Strain: Medium Risk  . Difficulty of Paying Living  Expenses: Somewhat hard  Food Insecurity: No Food Insecurity  . Worried About Programme researcher, broadcasting/film/video in the Last Year: Never true  . Ran Out of Food in the Last Year: Never true  Transportation Needs: No Transportation Needs  . Lack of Transportation (Medical): No  . Lack of Transportation (Non-Medical): No  Physical Activity: Inactive  . Days of Exercise per Week: 0 days  . Minutes of Exercise per Session: 0 min  Stress: Stress Concern Present  . Feeling of Stress : To some extent  Social Connections: Moderately Isolated  . Frequency of Communication with Friends and Family: More than three times a week  . Frequency of Social Gatherings with Friends and Family: Never  . Attends Religious Services: Never  . Active Member of Clubs or Organizations: No  . Attends Tax inspector Meetings: Never  . Marital Status: Married  Catering manager Violence: Not At Risk  . Fear of Current or Ex-Partner: No  . Emotionally Abused: No  . Physically Abused: No  . Sexually Abused: No    Outpatient Medications Prior to Visit  Medication Sig Dispense Refill  . albuterol (VENTOLIN HFA) 108 (90 BASE) MCG/ACT inhaler Inhale 2 puffs into the lungs every 6 (six) hours as needed for wheezing or shortness of breath.    Marland Kitchen atorvastatin (LIPITOR) 80 MG tablet Take 1 tablet (80 mg total) by mouth daily. 30 tablet 3  . carvedilol (COREG) 12.5 MG tablet Take 12.5 mg by mouth 2 (two) times daily with a meal.    . escitalopram (LEXAPRO) 10 MG tablet Take 1 tablet (10 mg total) by mouth daily. 90 tablet 0  . FREESTYLE LITE test strip 1 each by Other route in the morning and at bedtime.    . Lancets (FREESTYLE) lancets 1 each by Other route in the morning and at bedtime.    Marland Kitchen levothyroxine (SYNTHROID, LEVOTHROID) 25 MCG tablet Take 25 mcg by mouth daily before breakfast.    . losartan (COZAAR) 50 MG tablet Take 1 tablet (50 mg total) by mouth daily. 30 tablet 2  . metFORMIN (GLUCOPHAGE) 1000 MG tablet Take 1,000 mg by mouth 2 (two) times daily.    Marland Kitchen NOVOFINE 32G X 6 MM MISC 1 each by Other route See admin instructions. For Victoza pen    . VICTOZA 18 MG/3ML SOPN Inject 1.2 mg into the skin daily.    . Vitamin D, Ergocalciferol, (DRISDOL) 1.25 MG (50000 UNIT) CAPS capsule Take 1 capsule (50,000 Units total) by mouth every 7 (seven) days. 12 capsule 1   No facility-administered medications prior to visit.    No Known Allergies  ROS Review of Systems  Constitutional: Negative for chills and fever.  HENT: Negative for congestion, sinus pressure, sinus pain and sore throat.   Eyes: Negative for pain and discharge.  Respiratory: Negative for cough and shortness of breath.   Cardiovascular: Negative for chest pain and palpitations.  Gastrointestinal: Negative for abdominal pain,  constipation, diarrhea, nausea and vomiting.  Endocrine: Negative for polydipsia and polyuria.  Genitourinary: Negative for dysuria and hematuria.  Musculoskeletal: Negative for neck pain and neck stiffness.  Skin: Negative for rash.  Neurological: Negative for dizziness, seizures, syncope, weakness, light-headedness and numbness.  Psychiatric/Behavioral: Negative for agitation and behavioral problems.      Objective:    Physical Exam Vitals reviewed.  Constitutional:      General: She is not in acute distress.    Appearance: She is obese. She is not diaphoretic.  HENT:     Head: Normocephalic and atraumatic.     Nose: Nose normal. No congestion.     Mouth/Throat:     Mouth: Mucous membranes are moist.     Pharynx: No posterior oropharyngeal erythema.  Eyes:     General: No scleral icterus.    Extraocular Movements: Extraocular movements intact.     Pupils: Pupils are equal, round, and reactive to light.  Cardiovascular:     Rate and Rhythm: Normal rate and regular rhythm.     Heart sounds: Normal heart sounds. No murmur heard.  No friction rub. No gallop.   Pulmonary:     Breath sounds: Normal breath sounds. No wheezing or rales.  Abdominal:     Palpations: Abdomen is soft.     Tenderness: There is no abdominal tenderness.  Musculoskeletal:     Cervical back: Normal range of motion and neck supple. No rigidity or tenderness.     Right lower leg: No edema.     Left lower leg: No edema.  Skin:    General: Skin is warm.     Findings: No rash.  Neurological:     General: No focal deficit present.     Mental Status: She is alert and oriented to person, place, and time.     Sensory: No sensory deficit.     Motor: No weakness.  Psychiatric:        Mood and Affect: Mood normal.        Behavior: Behavior normal.     BP 127/83 (BP Location: Right Arm, Patient Position: Sitting, Cuff Size: Normal)   Pulse 75   Temp (!) 97 F (36.1 C) (Temporal)   Resp 18   Ht 5\' 3"   (1.6 m)   Wt 264 lb 6.4 oz (119.9 kg)   SpO2 97%   BMI 46.84 kg/m  Wt Readings from Last 3 Encounters:  10/29/19 264 lb 6.4 oz (119.9 kg)  10/08/19 269 lb 6.4 oz (122.2 kg)  09/08/19 265 lb 1.9 oz (120.3 kg)     Health Maintenance Due  Topic Date Due  . Hepatitis C Screening  Never done  . PNEUMOCOCCAL POLYSACCHARIDE VACCINE AGE 65-64 HIGH RISK  Never done  . FOOT EXAM  Never done  . OPHTHALMOLOGY EXAM  Never done  . HIV Screening  Never done  . TETANUS/TDAP  Never done  . PAP SMEAR-Modifier  04/30/2016    There are no preventive care reminders to display for this patient.  Lab Results  Component Value Date   TSH 1.760 09/09/2019   Lab Results  Component Value Date   WBC 10.2 10/19/2019   HGB 12.7 10/19/2019   HCT 42.6 10/19/2019   MCV 75 (L) 10/19/2019   PLT 262 10/19/2019   Lab Results  Component Value Date   NA 146 (H) 10/19/2019   K 4.3 10/19/2019   CO2 25 10/19/2019   GLUCOSE 105 (H) 10/19/2019   BUN 8 10/19/2019   CREATININE 0.63 10/19/2019   BILITOT 0.7 09/09/2019   ALKPHOS 95 09/09/2019   AST 35 09/09/2019   ALT 22 09/09/2019   PROT 7.3 09/09/2019   ALBUMIN 4.5 09/09/2019   CALCIUM 9.8 10/19/2019   ANIONGAP 10 11/27/2018   Lab Results  Component Value Date   CHOL 192 09/09/2019   Lab Results  Component Value Date   HDL 40 09/09/2019   Lab Results  Component Value Date   LDLCALC 138 (H) 09/09/2019   Lab Results  Component Value  Date   TRIG 74 09/09/2019   Lab Results  Component Value Date   CHOLHDL 4.8 (H) 09/09/2019   Lab Results  Component Value Date   HGBA1C 8.2 (H) 09/09/2019      Assessment & Plan:   Problem List Items Addressed This Visit   Visit Diagnoses    HTN BP: 127/83  Home BP readings reviewed Continue Coreg and Losartan Advised to continue low-salt diet and moderate exercise  DM HbA1C: 8.2 (08/2019) On Victoza and Metformin Advised to follow diabetic diet On ARB and statin F/u CMP and lipid  panel  Depression Well-controlled with Lexapro  Amenorrhea For 3 months Patient states that she is not sexually active. Patient is being referred to Ob./Gyn. for routine PAP smear screening. Advised to discuss with Ob./Gyn.   Routine cervical smear    -  Primary   Relevant Orders   Ambulatory referral to Obstetrics / Gynecology      No orders of the defined types were placed in this encounter.   Follow-up: Return if symptoms worsen or fail to improve.    Anabel Halonutwik K [place] Folden, MD

## 2019-11-19 ENCOUNTER — Other Ambulatory Visit: Payer: Self-pay

## 2019-11-20 ENCOUNTER — Other Ambulatory Visit (HOSPITAL_COMMUNITY): Payer: Self-pay | Admitting: Family Medicine

## 2019-11-20 MED FILL — VITAMIN D3 50,000 UNITS CAP: 1.25 MG | 28 days supply | Qty: 4 | Fill #0

## 2019-11-20 MED FILL — LEVOTHYROXINE 25 MCG TABLET: 25 | 30 days supply | Qty: 30 | Fill #0

## 2019-11-20 MED FILL — ASHLYNA 0.15-0.03-0.01 MG T: 0.15-0.03 & | 91 days supply | Qty: 91 | Fill #0

## 2019-11-20 MED FILL — ATORVASTATIN 40 MG TABLET: 40 | 30 days supply | Qty: 30 | Fill #0

## 2019-11-24 ENCOUNTER — Other Ambulatory Visit: Payer: Self-pay

## 2019-11-24 ENCOUNTER — Encounter (HOSPITAL_COMMUNITY): Payer: Self-pay | Admitting: *Deleted

## 2019-11-24 ENCOUNTER — Emergency Department (HOSPITAL_COMMUNITY)
Admission: EM | Admit: 2019-11-24 | Discharge: 2019-11-24 | Disposition: A | Discharge status: Home / Self Care | Payer: 59 | Attending: Emergency Medicine | Admitting: Emergency Medicine

## 2019-11-24 ENCOUNTER — Emergency Department (HOSPITAL_COMMUNITY): Payer: 59

## 2019-11-24 DIAGNOSIS — J45909 Unspecified asthma, uncomplicated: Secondary | ICD-10-CM | POA: Diagnosis not present

## 2019-11-24 DIAGNOSIS — Z20822 Contact with and (suspected) exposure to covid-19: Secondary | ICD-10-CM | POA: Insufficient documentation

## 2019-11-24 DIAGNOSIS — J40 Bronchitis, not specified as acute or chronic: Secondary | ICD-10-CM

## 2019-11-24 DIAGNOSIS — Z7984 Long term (current) use of oral hypoglycemic drugs: Secondary | ICD-10-CM | POA: Insufficient documentation

## 2019-11-24 DIAGNOSIS — E039 Hypothyroidism, unspecified: Secondary | ICD-10-CM | POA: Insufficient documentation

## 2019-11-24 DIAGNOSIS — F17210 Nicotine dependence, cigarettes, uncomplicated: Secondary | ICD-10-CM | POA: Diagnosis not present

## 2019-11-24 DIAGNOSIS — Z79899 Other long term (current) drug therapy: Secondary | ICD-10-CM | POA: Diagnosis not present

## 2019-11-24 DIAGNOSIS — E1165 Type 2 diabetes mellitus with hyperglycemia: Secondary | ICD-10-CM | POA: Insufficient documentation

## 2019-11-24 DIAGNOSIS — I10 Essential (primary) hypertension: Secondary | ICD-10-CM | POA: Insufficient documentation

## 2019-11-24 DIAGNOSIS — Z794 Long term (current) use of insulin: Secondary | ICD-10-CM | POA: Diagnosis not present

## 2019-11-24 DIAGNOSIS — R059 Cough, unspecified: Secondary | ICD-10-CM | POA: Diagnosis not present

## 2019-11-24 DIAGNOSIS — R110 Nausea: Secondary | ICD-10-CM | POA: Diagnosis not present

## 2019-11-24 LAB — RESPIRATORY PANEL BY RT PCR (FLU A&B, COVID)
Influenza A by PCR: NEGATIVE
Influenza B by PCR: NEGATIVE
SARS Coronavirus 2 by RT PCR: NEGATIVE

## 2019-11-24 MED ORDER — DOXYCYCLINE HYCLATE 100 MG PO CAPS: 100.0000 mg | ORAL_CAPSULE | Freq: Two times a day (BID) | ORAL | 0 refills | Status: DC

## 2019-11-24 MED ORDER — ALBUTEROL SULFATE HFA 108 (90 BASE) MCG/ACT IN AERS
4.0000 | INHALATION_SPRAY | Freq: Once | RESPIRATORY_TRACT | Status: AC
  Administered 2019-11-24: 4 via RESPIRATORY_TRACT
  Filled 2019-11-24: qty 6.7

## 2019-11-24 NOTE — Discharge Instructions (Addendum)
Use the inhaler every 4-6 hours as needed for shortness of breath.  Follow-up as needed

## 2019-11-24 NOTE — ED Provider Notes (Signed)
[facility] EMERGENCY DEPARTMENT Provider Note   CSN: [id number] Arrival date & time: 11/24/19  2112     History Chief Complaint  Patient presents with  . Cough    [name] is a 39 y.o. female.  Patient complains of cough and fever which has been starting today.  The history is provided by the patient and medical records. No language interpreter was used.  Cough Cough characteristics:  Non-productive Sputum characteristics:  Nondescript Severity:  Moderate Onset quality:  Sudden Timing:  Constant Progression:  Waxing and waning Chronicity:  New Smoker: no   Context: not animal exposure   Associated symptoms: no chest pain, no eye discharge, no headaches and no rash        Past Medical History:  Diagnosis Date  . Arthritis   . Asthma   . 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 Chondromalacia of patellofemoral joint, right   . Depression    Phreesia 09/05/2019  . Diabetes mellitus without complication (HCC)   . Headache   . Hyperlipidemia    Phreesia 09/05/2019  . Hypertension   . Hypothyroidism   . Osteoporosis   . S/P right knee arthroscopy   . Thyroid disease     Patient Active Problem List   Diagnosis Date Noted  . Depression, major, single episode, mild (HCC) 09/08/2019  . Type 2 diabetes mellitus with hyperglycemia, without long-term current use of insulin (HCC) 09/08/2019  . Need for immunization against influenza 09/08/2019  . Essential hypertension 09/08/2019  . Hyperlipidemia 09/08/2019  . Hypothyroidism 09/08/2019  . Abnormal cervical Papanicolaou smear 09/08/2019  . Other fatigue 09/08/2019  . Morbid obesity with body mass index (BMI) of 45.0 to 49.9 in adult [facility]) 03/12/2019  . OSA (obstructive sleep apnea) 12/11/2016  . Asthma 10/17/2010    . 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 extra digit removed Left   . KNEE ARTHROSCOPY WITH MEDIAL MENISECTOMY Right 08/02/2016   Procedure: chondroplasty;  Surgeon: Vickki Hearing, MD;  Location: [facility];  Service: Orthopedics;  Laterality: Right;     OB History    Gravida  0   Para  0   Term  0   Preterm  0   AB  0   Living  0     SAB  0   TAB  0   Ectopic  0   Multiple  0   Live Births              Family History  Problem Relation Age of Onset  . Hypothyroidism Mother   . Hypertension Other     Social History   Tobacco Use  . Smoking status: Current Every Day Smoker    Packs/day: 1.50    Years: 13.00    Pack years: 19.50    Types: Cigarettes  . Smokeless tobacco: Never Used  Vaping Use  . Vaping Use: Never used  Substance Use Topics  . Alcohol use: Yes    Comment: Social drinker   . Drug use: No    Home Medications Prior to Admission medications   Medication Sig Start Date End Date Taking? Authorizing Provider  albuterol (VENTOLIN HFA) 108 (90 BASE) MCG/ACT inhaler Inhale 2 puffs into the lungs every 6 (six) hours as needed for wheezing  or shortness of breath.    [provider]  atorvastatin (LIPITOR) 80 MG tablet Take 1 tablet (80 mg total) by mouth daily. 09/11/19   Freddy Finner, NP  carvedilol (COREG) 12.5 MG tablet Take 12.5 mg by mouth 2 (two) times daily with a meal.    [provider]  escitalopram (LEXAPRO) 10 MG tablet Take 1 tablet (10 mg total) by mouth daily. 09/08/19   Freddy Finner, NP  FREESTYLE LITE test strip 1 each by Other route in the morning and at bedtime. 03/25/19   [provider]  Lancets (FREESTYLE) lancets 1 each by Other route in the morning and at bedtime. 03/25/19   [provider]  levothyroxine (SYNTHROID, LEVOTHROID) 25 MCG tablet Take 25 mcg by mouth daily before breakfast.    [provider]  losartan  (COZAAR) 50 MG tablet Take 1 tablet (50 mg total) by mouth daily. 10/08/19   Anabel Halon, MD  metFORMIN (GLUCOPHAGE) 1000 MG tablet Take 1,000 mg by mouth 2 (two) times daily. 03/25/19   [provider]  NOVOFINE 32G X 6 MM MISC 1 each by Other route See admin instructions. For Victoza pen 04/20/19   [provider]  VICTOZA 18 MG/3ML SOPN Inject 1.2 mg into the skin daily. 05/25/19   [provider]  Vitamin D, Ergocalciferol, (DRISDOL) 1.25 MG (50000 UNIT) CAPS capsule Take 1 capsule (50,000 Units total) by mouth every 7 (seven) days. 09/11/19   Freddy Finner, NP    Allergies    Patient has no known allergies.  Review of Systems   Review of Systems  Constitutional: Negative for appetite change and fatigue.  HENT: Negative for congestion, ear discharge and sinus pressure.   Eyes: Negative for discharge.  Respiratory: Positive for cough.   Cardiovascular: Negative for chest pain.  Gastrointestinal: Negative for abdominal pain and diarrhea.  Genitourinary: Negative for frequency and hematuria.  Musculoskeletal: Negative for back pain.  Skin: Negative for rash.  Neurological: Negative for seizures and headaches.  Psychiatric/Behavioral: Negative for hallucinations.    Physical Exam Updated Vital Signs BP 125/65 (BP Location: Right Arm)   Pulse 90   Temp 99.4 F (37.4 C) (Oral)   Resp 16   Ht 5\' 3"  (1.6 m)   LMP 11/05/2019   SpO2 97%   BMI 46.84 kg/m   Physical Exam Vitals reviewed.  Constitutional:      Appearance: She is well-developed.  HENT:     Head: Normocephalic.     Nose: Nose normal.  Eyes:     General: No scleral icterus.    Conjunctiva/sclera: Conjunctivae normal.  Neck:     Thyroid: No thyromegaly.  Cardiovascular:     Rate and Rhythm: Normal rate and regular rhythm.     Heart sounds: No murmur heard.  No friction rub. No gallop.   Pulmonary:     Breath sounds: No stridor. Wheezing present. No rales.  Chest:     Chest wall:  No tenderness.  Abdominal:     General: There is no distension.     Tenderness: There is no abdominal tenderness. There is no rebound.  Musculoskeletal:        General: Normal range of motion.     Cervical back: Neck supple.  Lymphadenopathy:     Cervical: No cervical adenopathy.  Skin:    Findings: No erythema or rash.  Neurological:     Mental Status: She is alert and oriented to person, place, and time.  Motor: No abnormal muscle tone.     Coordination: Coordination normal.  Psychiatric:        Behavior: Behavior normal.     ED Results / Procedures / Treatments   Labs (all labs ordered are listed, but only abnormal results are displayed) Labs Reviewed  RESPIRATORY PANEL BY RT PCR (FLU A&B, COVID)    EKG None  Radiology DG Chest Port 1 View  Result Date: 11/24/2019 CLINICAL DATA:  Shortness of breath, emesis, nausea and cough fever, awaiting testing of COVID-19 EXAM: PORTABLE CHEST 1 VIEW COMPARISON:  Radiograph 10/09/2018 FINDINGS: Diffuse airways thickening with some streaky and patchy opacities particularly right mid to lower lung. Pulmonary vascularity is normally distributed. No convincing features of edema. No pneumothorax or effusion. Prominent cardiac silhouette, possibly accentuated by portable technique. No acute osseous or soft tissue abnormality. IMPRESSION: 1. Diffuse airways thickening with some streaky and patchy opacities particularly right mid to lower lung, findings could reflect a bronchitis or atypical infection in the appropriate clinical setting. 2. Prominent cardiac silhouette, possibly accentuated by portable technique. Electronically Signed   By: Kreg Shropshire M.D.   On: 11/24/2019 22:01    Procedures Procedures (including critical care time)  Medications Ordered in ED Medications  albuterol (VENTOLIN HFA) 108 (90 Base) MCG/ACT inhaler 4 puff (4 puffs Inhalation Given 11/24/19 2223)    ED Course  I have reviewed the triage vital signs and the  nursing notes.  Pertinent labs & imaging results that were available during my care of the patient were reviewed by me and considered in my medical decision making (see chart for details).    MDM Rules/Calculators/A&P    Patient with a cough and a negative Covid test.  Chest x-ray is consistent with                     Atypical infection.  Patient placed on doxycycline will follow up with PCP  Final Clinical Impression(s) / ED Diagnoses Final diagnoses:  None    Rx / DC Orders ED Discharge Orders    None       Bethann Berkshire, MD 11/25/19 1253

## 2019-11-24 NOTE — ED Triage Notes (Signed)
Pt with fever and cough starting today, unsure of any Covid exposure.  Pt took some Robitussin at home.  Highest fever at home 101.6, 99.4 in triage. Productive cough at times per pt. + nausea, emesis yesterday on way home from work.

## 2019-12-15 ENCOUNTER — Other Ambulatory Visit: Payer: Self-pay

## 2019-12-15 ENCOUNTER — Ambulatory Visit (INDEPENDENT_AMBULATORY_CARE_PROVIDER_SITE_OTHER): Payer: 59 | Admitting: Family Medicine

## 2019-12-15 ENCOUNTER — Encounter: Payer: Self-pay | Admitting: Family Medicine

## 2019-12-15 ENCOUNTER — Other Ambulatory Visit: Payer: Self-pay | Admitting: Family Medicine

## 2019-12-15 VITALS — BP 130/80 | HR 88 | Temp 98.8°F | Ht 63.0 in | Wt 267.0 lb

## 2019-12-15 DIAGNOSIS — I10 Essential (primary) hypertension: Secondary | ICD-10-CM | POA: Diagnosis not present

## 2019-12-15 DIAGNOSIS — F320 Major depressive disorder, single episode, mild: Secondary | ICD-10-CM | POA: Diagnosis not present

## 2019-12-15 DIAGNOSIS — E1165 Type 2 diabetes mellitus with hyperglycemia: Secondary | ICD-10-CM | POA: Diagnosis not present

## 2019-12-15 DIAGNOSIS — Z720 Tobacco use: Secondary | ICD-10-CM | POA: Diagnosis not present

## 2019-12-15 DIAGNOSIS — E785 Hyperlipidemia, unspecified: Secondary | ICD-10-CM

## 2019-12-15 DIAGNOSIS — Z6841 Body Mass Index (BMI) 40.0 and over, adult: Secondary | ICD-10-CM | POA: Diagnosis not present

## 2019-12-15 DIAGNOSIS — E039 Hypothyroidism, unspecified: Secondary | ICD-10-CM | POA: Diagnosis not present

## 2019-12-15 LAB — POCT GLYCOSYLATED HEMOGLOBIN (HGB A1C): Hemoglobin A1C: 7.7 % — AB (ref 4.0–5.6)

## 2019-12-15 MED ORDER — LEVOTHYROXINE SODIUM 25 MCG PO TABS: 25.0000 ug | ORAL_TABLET | Freq: Every day | ORAL | 1 refills | Status: DC

## 2019-12-15 MED ORDER — VICTOZA 18 MG/3ML ~~LOC~~ SOPN: 1.2000 mg | PEN_INJECTOR | Freq: Every day | SUBCUTANEOUS | 3 refills | Status: DC

## 2019-12-15 MED ORDER — ATORVASTATIN CALCIUM 80 MG PO TABS: 80.0000 mg | ORAL_TABLET | Freq: Every day | ORAL | 1 refills | Status: DC

## 2019-12-15 MED ORDER — ESCITALOPRAM OXALATE 10 MG PO TABS: 10.0000 mg | ORAL_TABLET | Freq: Every day | ORAL | 1 refills | Status: DC

## 2019-12-15 MED ORDER — LOSARTAN POTASSIUM 50 MG PO TABS: 50.0000 mg | ORAL_TABLET | Freq: Every day | ORAL | 1 refills | Status: DC

## 2019-12-15 MED ORDER — CARVEDILOL 12.5 MG PO TABS: 12.5000 mg | ORAL_TABLET | Freq: Every day | ORAL | 0 refills | Status: DC

## 2019-12-15 MED ORDER — METFORMIN HCL 1000 MG PO TABS: 1000.0000 mg | ORAL_TABLET | Freq: Two times a day (BID) | ORAL | 1 refills | Status: DC

## 2019-12-15 MED ORDER — GABAPENTIN 100 MG PO CAPS: 100.0000 mg | ORAL_CAPSULE | Freq: Every day | ORAL | 1 refills | Status: DC

## 2019-12-15 MED FILL — ATORVASTATIN 80 MG TABLET: 80 | 90 days supply | Qty: 90 | Fill #0

## 2019-12-15 MED FILL — ESCITALOPRAM 10 MG TABLET: 10 | 90 days supply | Qty: 90 | Fill #0

## 2019-12-15 MED FILL — CARVEDILOL 12.5 MG TABLET: 12.5 | 30 days supply | Qty: 30 | Fill #0

## 2019-12-15 MED FILL — LEVOTHYROXINE 25 MCG TABLET: 25 | 90 days supply | Qty: 90 | Fill #0

## 2019-12-15 MED FILL — GABAPENTIN 100 MG CAPSULE: 100 | 90 days supply | Qty: 90 | Fill #0

## 2019-12-15 MED FILL — LOSARTAN POTASSIUM 50 MG TA: 50 | 90 days supply | Qty: 90 | Fill #0

## 2019-12-15 NOTE — Assessment & Plan Note (Signed)
[name] is encouraged to maintain a well balanced diet that is low in salt. Controlled, continue current medication regimen. Refills provided on Cozaar Reduction of Coreg as she is unsure when she was started on this or why she was started on this.  She has never had an echocardiogram read to see a cardiologist.  She reports she thinks the health department started on this for a while. Additionally, she is also reminded that exercise is beneficial for heart health and control of  Blood pressure. 30-60 minutes daily is recommended-walking was suggested. CPAP encouraged

## 2019-12-15 NOTE — Assessment & Plan Note (Signed)
[name] is encouraged to check blood sugar daily as directed. Continue current medications. Is on statin as well. A1c improved 7.7% Educated on importance of maintain a well balanced diabetic friendly diet.  She is reminded the importance of maintaining  good blood sugars,  taking medications as directed, daily foot care, annual eye exams. Additionally educated about keeping good control over blood pressure and cholesterol as well.

## 2019-12-15 NOTE — Assessment & Plan Note (Signed)
Asked about quitting: confirms they are currently smokes cigarettes Advise to quit smoking: Educated about QUITTING to reduce the risk of cancer, cardio and cerebrovascular disease. Assess willingness: willing to quit at this time, but is working on cutting back. Assist with counseling and pharmacotherapy: Counseled for 5 minutes and literature provided. Arrange for follow up: (wants to work on quitting follow up in 3 months and continue to offer help.   She reports patches and gum do not help her.  Wellbutrin would be an option if she is willing to switch from Lexapro to Wellbutrin.  She reports she like to think about this at this time.

## 2019-12-15 NOTE — Assessment & Plan Note (Signed)
Heart healthy diet is encouraged.  Continue current dose of Lipitor.

## 2019-12-15 NOTE — Assessment & Plan Note (Signed)
Denies have any signs or symptoms of hypothyroidism at this time.  Continue current dose of Synthroid.

## 2019-12-15 NOTE — Assessment & Plan Note (Signed)
Obesity is linked to type 2 diabetes, hypertension, hyperlipidemia  [name] is re-educated about the importance of exercise daily to help with weight management. A minumum of 30 minutes daily is recommended. Additionally, importance of healthy food choices  with portion control discussed.  Wt Readings from Last 3 Encounters:  12/15/19 267 lb (121.1 kg)  10/29/19 264 lb 6.4 oz (119.9 kg)  10/08/19 269 lb 6.4 oz (122.2 kg)

## 2019-12-15 NOTE — Patient Instructions (Signed)
I appreciate the opportunity to provide you with care for your health and wellness. Today we discussed: med refills and A1c  Follow up: 4 weeks by phone- please keep log of blood pressure and heart rate and provide by mychart   No labs or referrals today  Medications refilled today  Med Change: Reduced Coreg to once daily,  We will look to stop if BP and heart rate are good and you feel ok with it.  Please continue to practice social distancing to keep you, your family, and our community safe.  If you must go out, please wear a mask and practice good handwashing.  It was a pleasure to see you and I look forward to continuing to work together on your health and well-being. Please do not hesitate to call the office if you need care or have questions about your care.  Have a wonderful day and week. With Gratitude, Tereasa Coop, DNP, AGNP-BC

## 2019-12-15 NOTE — Assessment & Plan Note (Signed)
Denies have any SI or HI.  Continue Lexapro at this time.  However there was slight discussion on whether or not she should switch to Wellbutrin for smoking cessation.  She reports that she would like to think about this at this time.

## 2019-12-15 NOTE — Progress Notes (Signed)
Subjective:  Patient ID: [name], female    DOB: 05/03/1980  Age: 39 y.o. MRN: [id number]  CC:  Chief Complaint  Patient presents with  . medication refills      HPI  HPI   [name] is a 39 year old female patient of mine who presents today for medication refills in addition to A1c check.  Overall she is doing well.  She is tolerating her medications without issue.  She would like to get 90-day supply on these.  Extensive review of medication in office today.  A1c much improved to 7.7%.  Reports blood sugars can still be elevated in the mornings.  But denies having any hypo or hyper glycemic events.  Denies having any polydipsia, polyuria, polyphagia.  Denies having any chest pain, cough, shortness of breath, headaches, dizziness, vision changes, sinus issues or illness.  Today patient denies signs and symptoms of COVID 19 infection including fever, chills, cough, shortness of breath, and headache. Past Medical, Surgical, Social History, Allergies, and Medications have been Reviewed.   Past Medical History:  Diagnosis Date  . Arthritis   . Asthma   . 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 Chondromalacia of patellofemoral joint, right   . Depression    Phreesia 09/05/2019  . Diabetes mellitus without complication (HCC)   . Headache   . Hyperlipidemia    Phreesia 09/05/2019  . Hypertension   . Hypothyroidism   . Osteoporosis   . S/P right knee arthroscopy   . Thyroid disease     Current Meds  Medication Sig  . albuterol (VENTOLIN HFA) 108 (90 BASE) MCG/ACT inhaler Inhale 2 puffs into the lungs every 6 (six) hours as needed for wheezing or shortness of breath.  Marland Kitchen atorvastatin (LIPITOR) 80 MG tablet Take 1 tablet (80 mg total) by mouth daily.  . carvedilol (COREG)  12.5 MG tablet Take 1 tablet (12.5 mg total) by mouth daily.  Marland Kitchen escitalopram (LEXAPRO) 10 MG tablet Take 1 tablet (10 mg total) by mouth daily.  Marland Kitchen FREESTYLE LITE test strip 1 each by Other route in the morning and at bedtime.  . Lancets (FREESTYLE) lancets 1 each by Other route in the morning and at bedtime.  Marland Kitchen levothyroxine (SYNTHROID) 25 MCG tablet Take 1 tablet (25 mcg total) by mouth daily before breakfast.  . losartan (COZAAR) 50 MG tablet Take 1 tablet (50 mg total) by mouth daily.  . metFORMIN (GLUCOPHAGE) 1000 MG tablet Take 1 tablet (1,000 mg total) by mouth 2 (two) times daily.  Marland Kitchen NOVOFINE 32G X 6 MM MISC 1 each by Other route See admin instructions. For Victoza pen  . VICTOZA 18 MG/3ML SOPN Inject 1.2 mg into the skin daily.  . [DISCONTINUED] atorvastatin (LIPITOR) 80 MG tablet Take 1 tablet (80 mg total) by mouth daily.  . [DISCONTINUED] carvedilol (COREG) 12.5 MG tablet Take 12.5 mg by mouth 2 (two) times daily with a meal.  . [DISCONTINUED] doxycycline (VIBRAMYCIN) 100 MG capsule Take 1 capsule (100 mg total) by mouth 2 (two) times daily. One po bid x 7 days  . [DISCONTINUED] escitalopram (LEXAPRO) 10 MG tablet Take 1 tablet (10 mg total) by mouth daily.  . [DISCONTINUED] levothyroxine (SYNTHROID, LEVOTHROID) 25 MCG tablet Take 25 mcg by mouth daily before breakfast.  . [DISCONTINUED] losartan (COZAAR) 50 MG  tablet Take 1 tablet (50 mg total) by mouth daily.  . [DISCONTINUED] metFORMIN (GLUCOPHAGE) 1000 MG tablet Take 1,000 mg by mouth 2 (two) times daily.  . [DISCONTINUED] VICTOZA 18 MG/3ML SOPN Inject 1.2 mg into the skin daily.  . [DISCONTINUED] Vitamin D, Ergocalciferol, (DRISDOL) 1.25 MG (50000 UNIT) CAPS capsule Take 1 capsule (50,000 Units total) by mouth every 7 (seven) days.    ROS:  Review of Systems  Constitutional: Negative.   HENT: Negative.   Eyes: Negative.   Respiratory: Negative.   Cardiovascular: Negative.   Gastrointestinal: Negative.   Genitourinary:  Negative.   Musculoskeletal: Negative.   Skin: Negative.   Neurological: Negative.   Endo/Heme/Allergies: Negative.   Psychiatric/Behavioral: Negative.      Objective:   Today's Vitals: BP 130/80 (BP Location: Right Arm, Patient Position: Sitting, Cuff Size: Normal)   Pulse 88   Temp 98.8 F (37.1 C) (Temporal)   Ht 5\' 3"  (1.6 m)   Wt 267 lb (121.1 kg)   LMP 12/13/2019   SpO2 99%   BMI 47.30 kg/m  Vitals with BMI 12/15/2019 11/24/2019 11/24/2019  Height 5\' 3"  - -  Weight 267 lbs - -  BMI 47.31 - -  Systolic 130 134 13/09/2019  Diastolic 80 75 78  Pulse 88 77 83     Physical Exam Vitals and nursing note reviewed.  Constitutional:      Appearance: Normal appearance. She is well-developed and well-groomed. She is morbidly obese.  HENT:     Head: Normocephalic and atraumatic.     Right Ear: External ear normal.     Left Ear: External ear normal.     Mouth/Throat:     Comments: Mask in place Eyes:     General:        Right eye: No discharge.        Left eye: No discharge.     Conjunctiva/sclera: Conjunctivae normal.  Cardiovascular:     Rate and Rhythm: Normal rate and regular rhythm.     Pulses: Normal pulses.     Heart sounds: Normal heart sounds.  Pulmonary:     Effort: Pulmonary effort is normal.     Breath sounds: Normal breath sounds.  Musculoskeletal:        General: Normal range of motion.     Cervical back: Normal range of motion and neck supple.  Skin:    General: Skin is warm.  Neurological:     General: No focal deficit present.     Mental Status: She is alert and oriented to person, place, and time.  Psychiatric:        Attention and Perception: Attention normal.        Mood and Affect: Mood normal.        Speech: Speech normal.        Behavior: Behavior normal. Behavior is cooperative.        Thought Content: Thought content normal.        Cognition and Memory: Cognition normal.        Judgment: Judgment normal.     Assessment   1. Morbid  obesity with body mass index (BMI) of 45.0 to 49.9 in adult [facility])   2. Type 2 diabetes mellitus with hyperglycemia, without long-term current use of insulin (HCC)   3. Depression, major, single episode, mild (HCC)   4. Essential hypertension   5. Hypothyroidism, unspecified type   6. Hyperlipidemia, unspecified hyperlipidemia type   7. Nicotine abuse     Tests  ordered Orders Placed This Encounter  Procedures  . POCT glycosylated hemoglobin (Hb A1C)     Plan: Please see assessment and plan per problem list above.   Meds ordered this encounter  Medications  . escitalopram (LEXAPRO) 10 MG tablet    Sig: Take 1 tablet (10 mg total) by mouth daily.    Dispense:  90 tablet    Refill:  1    Order Specific Question:   Supervising Provider    Answer:   SIMPSON, MARGARET E [[id number]]  . atorvastatin (LIPITOR) 80 MG tablet    Sig: Take 1 tablet (80 mg total) by mouth daily.    Dispense:  90 tablet    Refill:  1    Order Specific Question:   Supervising Provider    Answer:   SIMPSON, MARGARET E [[id number]]  . carvedilol (COREG) 12.5 MG tablet    Sig: Take 1 tablet (12.5 mg total) by mouth daily.    Dispense:  30 tablet    Refill:  0    Order Specific Question:   Supervising Provider    Answer:   SIMPSON, MARGARET E [[id number]]  . levothyroxine (SYNTHROID) 25 MCG tablet    Sig: Take 1 tablet (25 mcg total) by mouth daily before breakfast.    Dispense:  90 tablet    Refill:  1    Order Specific Question:   Supervising Provider    Answer:   SIMPSON, MARGARET E [[id number]]  . losartan (COZAAR) 50 MG tablet    Sig: Take 1 tablet (50 mg total) by mouth daily.    Dispense:  90 tablet    Refill:  1    Order Specific Question:   Supervising Provider    Answer:   SIMPSON, MARGARET E [[id number]]  . metFORMIN (GLUCOPHAGE) 1000 MG tablet    Sig: Take 1 tablet (1,000 mg total) by mouth 2 (two) times daily.    Dispense:  180 tablet    Refill:  1    Order Specific Question:   Supervising Provider    Answer:    SIMPSON, MARGARET E [[id number]]  . VICTOZA 18 MG/3ML SOPN    Sig: Inject 1.2 mg into the skin daily.    Dispense:  3 mL    Refill:  3    Order Specific Question:   Supervising Provider    Answer:   SIMPSON, MARGARET E [[id number]]  . gabapentin (NEURONTIN) 100 MG capsule    Sig: Take 1 capsule (100 mg total) by mouth at bedtime.    Dispense:  90 capsule    Refill:  1    Order Specific Question:   Supervising Provider    Answer:   Kerri Perches [[id number]]    Patient to follow-up in 4 weeks for med change follow up  Note: This dictation was prepared with Dragon dictation along with smaller phrase technology. Similar sounding words can be transcribed inadequately or may not be corrected upon review. Any transcriptional errors that result from this process are unintentional.      Freddy Finner, NP

## 2019-12-16 ENCOUNTER — Other Ambulatory Visit: Payer: 59 | Admitting: Adult Health

## 2019-12-29 DIAGNOSIS — Z23 Encounter for immunization: Secondary | ICD-10-CM | POA: Diagnosis not present

## 2020-01-06 ENCOUNTER — Other Ambulatory Visit (HOSPITAL_COMMUNITY): Payer: Self-pay | Admitting: Family Medicine

## 2020-01-06 MED FILL — ATORVASTATIN 40 MG TABLET: 40 | 30 days supply | Qty: 30 | Fill #0

## 2020-01-06 MED FILL — METFORMIN HCL 1000 MG TABS: 1000 | 30 days supply | Qty: 60 | Fill #0

## 2020-01-06 MED FILL — VITAMIN D3 50,000 UNITS CAP: 1.25 MG | 28 days supply | Qty: 4 | Fill #0

## 2020-01-06 MED FILL — CARVEDILOL 25 MG TABS: 25 | 30 days supply | Qty: 60 | Fill #0

## 2020-01-12 ENCOUNTER — Telehealth: Payer: 59 | Admitting: Family Medicine

## 2020-01-12 ENCOUNTER — Other Ambulatory Visit: Payer: Self-pay

## 2020-01-12 ENCOUNTER — Encounter: Payer: Self-pay | Admitting: Family Medicine

## 2020-01-12 VITALS — Ht 63.0 in | Wt 263.0 lb

## 2020-01-12 DIAGNOSIS — F320 Major depressive disorder, single episode, mild: Secondary | ICD-10-CM | POA: Diagnosis not present

## 2020-01-12 DIAGNOSIS — I10 Essential (primary) hypertension: Secondary | ICD-10-CM | POA: Diagnosis not present

## 2020-01-12 NOTE — Assessment & Plan Note (Signed)
Reports not taking medication as she is directed. BP readings at home are high. She is unsure what she is suppose to take. And very confused when asked. Future appts in office only. Advised on medication in detail today.

## 2020-01-12 NOTE — Assessment & Plan Note (Signed)
Reports not taking her medication. Reports depression is worse now. Is advised to start taking the medication daily as directed and not to abruptly stop it.  She verbalized understanding.  Will restart lexapro and have close follow up Denies SI and HI

## 2020-01-12 NOTE — Progress Notes (Signed)
Virtual Visit via Telephone Note   This visit type was conducted due to national recommendations for restrictions regarding the COVID-19 Pandemic (e.g. social distancing) in an effort to limit this patient's exposure and mitigate transmission in our community.  Due to her co-morbid illnesses, this patient is at least at moderate risk for complications without adequate follow up.  This format is felt to be most appropriate for this patient at this time.  The patient did not have access to video technology/had technical difficulties with video requiring transitioning to audio format only (telephone).  All issues noted in this document were discussed and addressed.  No physical exam could be performed with this format.    Evaluation Performed:  Follow-up visit  Date:  01/12/2020   ID:  [name], [name] 05-11-1980, MRN [id number]  Patient Location: Home Provider Location: Office/Clinic   Participants: Nurse for intake and work up; Patient and Provider for Visit and Wrap up  Method of visit: Telephone  Location of Patient: Home Location of Provider: Office Consent was obtain for visit over the telephone. Services rendered by provider: Visit was performed via telephone  I verified that I am speaking with the correct person using two identifiers.  PCP:  Freddy Finner, NP   Chief Complaint: Blood pressure follow-up  History of Present Illness:    [name] is a 39 y.o. female with history as stated below.  Presents for phone follow-up today for blood pressure follow-up.  Additionally she was supposed to have of blood pressure log but she is not sure what most of her blood pressures have been.  She does report 151/90 per her significant other.  She reports that she has not been taking her medications this includes her depression medications as well as her blood pressure medications.  She is very withdrawn from the conversation and overall does not feel very well.  She is  not having any active chest pain, headaches or dizziness.  She does realize that she should not have stopped her medications and she should have reached out when she did stop them before stopping them.  She is willing to restart her medications and follow-up in 3 to 4 weeks in the office and bring the medications and her blood pressure log with her.  The patient does not have symptoms concerning for COVID-19 infection (fever, chills, cough, or new shortness of breath).   Past Medical, Surgical, Social History, Allergies, and Medications have been Reviewed.  Past Medical History:  Diagnosis Date  . Arthritis   . Asthma   . 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 Chondromalacia of patellofemoral joint, right   . Depression    Phreesia 09/05/2019  . Diabetes mellitus without complication (HCC)   . Headache   . Hyperlipidemia    Phreesia 09/05/2019  . Hypertension   . Hypothyroidism   . Osteoporosis   . S/P right knee arthroscopy   . Thyroid disease    Past Surgical History:  Procedure Laterality Date  . extra digit removed Left   . KNEE ARTHROSCOPY WITH MEDIAL MENISECTOMY Right 08/02/2016   Procedure: chondroplasty;  Surgeon: Vickki Hearing, MD;  Location: [facility];  Service: Orthopedics;  Laterality: Right;     Current Meds  Medication Sig  . albuterol (VENTOLIN HFA) 108 (90 Base) MCG/ACT  inhaler Inhale 2 puffs into the lungs every 6 (six) hours as needed for wheezing or shortness of breath.  Marland Kitchen atorvastatin (LIPITOR) 80 MG tablet Take 1 tablet (80 mg total) by mouth daily.  . carvedilol (COREG) 12.5 MG tablet Take 1 tablet (12.5 mg total) by mouth daily.  Marland Kitchen escitalopram (LEXAPRO) 10 MG tablet Take 1 tablet (10 mg total) by mouth daily.  Marland Kitchen FREESTYLE LITE test strip 1 each by Other route in  the morning and at bedtime.  . gabapentin (NEURONTIN) 100 MG capsule Take 1 capsule (100 mg total) by mouth at bedtime.  . Lancets (FREESTYLE) lancets 1 each by Other route in the morning and at bedtime.  Marland Kitchen levothyroxine (SYNTHROID) 25 MCG tablet Take 1 tablet (25 mcg total) by mouth daily before breakfast.  . losartan (COZAAR) 50 MG tablet Take 1 tablet (50 mg total) by mouth daily.  . metFORMIN (GLUCOPHAGE) 1000 MG tablet Take 1 tablet (1,000 mg total) by mouth 2 (two) times daily.  Marland Kitchen NOVOFINE 32G X 6 MM MISC 1 each by Other route See admin instructions. For Victoza pen  . VICTOZA 18 MG/3ML SOPN Inject 1.2 mg into the skin daily.     Allergies:   Patient has no known allergies.   ROS:   Please see the history of present illness.    All other systems reviewed and are negative.   Labs/Other Tests and Data Reviewed:    Recent Labs: 09/09/2019: ALT 22; TSH 1.760 10/19/2019: BUN 8; Creatinine, Ser 0.63; Hemoglobin 12.7; Platelets 262; Potassium 4.3; Sodium 146   Recent Lipid Panel Lab Results  Component Value Date/Time   CHOL 192 09/09/2019 01:23 PM   TRIG 74 09/09/2019 01:23 PM   HDL 40 09/09/2019 01:23 PM   CHOLHDL 4.8 (H) 09/09/2019 01:23 PM   LDLCALC 138 (H) 09/09/2019 01:23 PM    Wt Readings from Last 3 Encounters:  01/12/20 263 lb (119.3 kg)  12/15/19 267 lb (121.1 kg)  10/29/19 264 lb 6.4 oz (119.9 kg)     Objective:    Vital Signs:  Ht 5\' 3"  (1.6 m)   Wt 263 lb (119.3 kg)   LMP 12/13/2019   BMI 46.59 kg/m    VITAL SIGNS:  reviewed GEN:  no acute distress RESPIRATORY:  No shortness of breath noted in conversation PSYCH:  Flat and withdrawn affect  ASSESSMENT & PLAN:    1. Essential hypertension   2. Depression, major, single episode, mild (HCC)   Time:   Today, I have spent 7 minutes with the patient with telehealth technology discussing the above problems and reviewing the chart.     Medication Adjustments/Labs and Tests Ordered: Current medicines  are reviewed at length with the patient today.  Concerns regarding medicines are outlined above.   Tests Ordered: No orders of the defined types were placed in this encounter.   Medication Changes: No orders of the defined types were placed in this encounter.    Disposition:  Follow up 02/09/2020 Signed, 02/11/2020, NP  01/12/2020 3:00 PM     01/14/2020 [facility]

## 2020-01-12 NOTE — Patient Instructions (Signed)
I appreciate the opportunity to provide you with care for your health and wellness. Today we discussed: BP and Depression  Follow up: 3-4 weeks in office - bring medications with you and blood pressure log   No labs or referrals today  Please take your medications as directed. Otherwise we will not know what is working for you.  You put your body at risk when you stop and start them.  Please take BP and write it down daily and bring to next appt along with medications (all)  Please take Lexapro daily and avoid stopping without talking to provider first, as this can cause  Problems and make you sick.  Remember only you can take care of you. So we gotta do good about taking our medications Set an alarm on phone if you need to, to help.   Please continue to practice social distancing to keep you, your family, and our community safe.  If you must go out, please wear a mask and practice good handwashing.  It was a pleasure to see you and I look forward to continuing to work together on your health and well-being. Please do not hesitate to call the office if you need care or have questions about your care.  Have a wonderful day. With Gratitude, Tereasa Coop, DNP, AGNP-BC

## 2020-01-19 ENCOUNTER — Other Ambulatory Visit: Payer: Self-pay

## 2020-01-19 ENCOUNTER — Other Ambulatory Visit (HOSPITAL_COMMUNITY)
Admission: RE | Admit: 2020-01-19 | Discharge: 2020-01-19 | Disposition: A | Discharge status: Home / Self Care | Payer: 59 | Source: Ambulatory Visit | Attending: Adult Health | Admitting: Adult Health

## 2020-01-19 ENCOUNTER — Encounter: Payer: Self-pay | Admitting: Adult Health

## 2020-01-19 ENCOUNTER — Ambulatory Visit (INDEPENDENT_AMBULATORY_CARE_PROVIDER_SITE_OTHER): Payer: 59 | Admitting: Adult Health

## 2020-01-19 VITALS — BP 139/86 | HR 66 | Ht 63.0 in | Wt 260.0 lb

## 2020-01-19 DIAGNOSIS — Z01419 Encounter for gynecological examination (general) (routine) without abnormal findings: Secondary | ICD-10-CM | POA: Insufficient documentation

## 2020-01-19 DIAGNOSIS — R87612 Low grade squamous intraepithelial lesion on cytologic smear of cervix (LGSIL): Secondary | ICD-10-CM | POA: Diagnosis not present

## 2020-01-19 NOTE — Progress Notes (Signed)
  Subjective:     Patient ID: [name], female   DOB: 07-05-1980, 40 y.o.   MRN: [id number]  HPI [name] is a 40 year old black female,married, G0P0, in for pelvic and pap. 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 It has been about 7 years since last pap. She is a new pt. PCP is Tereasa Coop NP  Review of Systems Pt denies any problems with bowel movements,urination or periods. She has female sex partner. Reviewed past medical,surgical, social and family history. Reviewed medications and allergies.     Objective:   Physical Exam BP 139/86 (BP Location: Left Arm, Patient Position: Sitting, Cuff Size: Normal)   Pulse 66   Ht 5\' 3"  (1.6 m)   Wt 260 lb (117.9 kg)   LMP 01/16/2020   BMI 46.06 kg/m  Skin warm and dry.Pelvic: external genitalia is normal in appearance no lesions, vagina:+period blood,urethra has no lesions or masses noted, cervix:smooth and nulliparous, pap with GC/CHL and HRHPV genotyping performed., uterus: normal size, shape and contour, non tender, no masses felt, adnexa: no masses or tenderness noted. Bladder is non tender and no masses felt.   AA is 3 Fall risk is low PHQ 9 score is 9, no SI, is on meds GAD 7 score is 10  Upstream - 01/19/20 1023      Pregnancy Intention Screening   Does the patient want to become pregnant in the next year? No    Does the patient's partner want to become pregnant in the next year? No    Would the patient like to discuss contraceptive options today? No      Contraception Wrap Up   Current Method No Method - Other Reason   has female partner   End Method No Method - Other Reason   has Female partner   Contraception Counseling Provided No         Examination chaperoned by 03/18/20 LPN  Assessment:     1. Encounter for gynecological examination with Papanicolaou smear of cervix Pap sent Pap in 3 years of normal  Physical and labs with PCP Mammogram at 42 Colonoscopy at 47 Encouraged to decrease smoking     Plan:     Pap in 3 years if normal  Can follow up prn

## 2020-01-27 LAB — CYTOLOGY - PAP
Adequacy: ABSENT
Chlamydia: NEGATIVE
Comment: NEGATIVE
Comment: NEGATIVE
Comment: NEGATIVE
Comment: NORMAL
HPV 16: NEGATIVE
HPV 18 / 45: NEGATIVE
High risk HPV: POSITIVE — AB
Neisseria Gonorrhea: NEGATIVE

## 2020-01-29 ENCOUNTER — Encounter: Payer: Self-pay | Admitting: Adult Health

## 2020-01-29 ENCOUNTER — Telehealth: Payer: Self-pay | Admitting: Adult Health

## 2020-01-29 DIAGNOSIS — R87618 Other abnormal cytological findings on specimens from cervix uteri: Secondary | ICD-10-CM

## 2020-01-29 HISTORY — DX: Other abnormal cytological findings on specimens from cervix uteri: R87.618

## 2020-01-29 NOTE — Telephone Encounter (Signed)
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

## 2020-01-30 DIAGNOSIS — R197 Diarrhea, unspecified: Secondary | ICD-10-CM | POA: Diagnosis not present

## 2020-01-30 DIAGNOSIS — R0602 Shortness of breath: Secondary | ICD-10-CM | POA: Diagnosis not present

## 2020-01-30 DIAGNOSIS — R0789 Other chest pain: Secondary | ICD-10-CM | POA: Diagnosis not present

## 2020-01-30 DIAGNOSIS — R42 Dizziness and giddiness: Secondary | ICD-10-CM | POA: Diagnosis not present

## 2020-01-30 DIAGNOSIS — R059 Cough, unspecified: Secondary | ICD-10-CM | POA: Diagnosis not present

## 2020-01-30 DIAGNOSIS — J029 Acute pharyngitis, unspecified: Secondary | ICD-10-CM | POA: Diagnosis not present

## 2020-01-30 DIAGNOSIS — R079 Chest pain, unspecified: Secondary | ICD-10-CM | POA: Diagnosis not present

## 2020-01-30 DIAGNOSIS — Z20822 Contact with and (suspected) exposure to covid-19: Secondary | ICD-10-CM | POA: Diagnosis not present

## 2020-02-09 ENCOUNTER — Ambulatory Visit: Payer: 59 | Admitting: Internal Medicine

## 2020-02-09 ENCOUNTER — Ambulatory Visit: Payer: 59 | Admitting: Family Medicine

## 2020-02-12 ENCOUNTER — Encounter: Payer: 59 | Admitting: Obstetrics & Gynecology

## 2020-02-14 DIAGNOSIS — I10 Essential (primary) hypertension: Secondary | ICD-10-CM | POA: Diagnosis not present

## 2020-02-17 DIAGNOSIS — I10 Essential (primary) hypertension: Secondary | ICD-10-CM | POA: Diagnosis not present

## 2020-02-17 DIAGNOSIS — Z0000 Encounter for general adult medical examination without abnormal findings: Secondary | ICD-10-CM | POA: Diagnosis not present

## 2020-02-17 DIAGNOSIS — Z6841 Body Mass Index (BMI) 40.0 and over, adult: Secondary | ICD-10-CM | POA: Diagnosis not present

## 2020-02-17 DIAGNOSIS — E119 Type 2 diabetes mellitus without complications: Secondary | ICD-10-CM | POA: Diagnosis not present

## 2020-02-17 DIAGNOSIS — Z760 Encounter for issue of repeat prescription: Secondary | ICD-10-CM | POA: Diagnosis not present

## 2020-02-17 DIAGNOSIS — E1165 Type 2 diabetes mellitus with hyperglycemia: Secondary | ICD-10-CM | POA: Diagnosis not present

## 2020-02-26 DIAGNOSIS — M79671 Pain in right foot: Secondary | ICD-10-CM | POA: Diagnosis not present

## 2020-02-26 DIAGNOSIS — G629 Polyneuropathy, unspecified: Secondary | ICD-10-CM | POA: Diagnosis not present

## 2020-02-26 DIAGNOSIS — M545 Low back pain, unspecified: Secondary | ICD-10-CM | POA: Diagnosis not present

## 2020-04-05 ENCOUNTER — Other Ambulatory Visit (HOSPITAL_BASED_OUTPATIENT_CLINIC_OR_DEPARTMENT_OTHER): Payer: Self-pay

## 2020-08-11 ENCOUNTER — Inpatient Hospital Stay (HOSPITAL_COMMUNITY)
Admission: EM | Admit: 2020-08-11 | Discharge: 2020-08-13 | DRG: 638 | Disposition: A | Discharge status: Home / Self Care | Payer: 59 | Attending: Internal Medicine | Admitting: Internal Medicine

## 2020-08-11 ENCOUNTER — Encounter (HOSPITAL_COMMUNITY): Payer: Self-pay | Admitting: Emergency Medicine

## 2020-08-11 ENCOUNTER — Other Ambulatory Visit: Payer: Self-pay

## 2020-08-11 DIAGNOSIS — Z79899 Other long term (current) drug therapy: Secondary | ICD-10-CM

## 2020-08-11 DIAGNOSIS — Z87410 Personal history of cervical dysplasia: Secondary | ICD-10-CM

## 2020-08-11 DIAGNOSIS — J45909 Unspecified asthma, uncomplicated: Secondary | ICD-10-CM | POA: Diagnosis present

## 2020-08-11 DIAGNOSIS — F17210 Nicotine dependence, cigarettes, uncomplicated: Secondary | ICD-10-CM | POA: Diagnosis present

## 2020-08-11 DIAGNOSIS — Z6841 Body Mass Index (BMI) 40.0 and over, adult: Secondary | ICD-10-CM

## 2020-08-11 DIAGNOSIS — G4733 Obstructive sleep apnea (adult) (pediatric): Secondary | ICD-10-CM | POA: Diagnosis present

## 2020-08-11 DIAGNOSIS — Z20822 Contact with and (suspected) exposure to covid-19: Secondary | ICD-10-CM | POA: Diagnosis present

## 2020-08-11 DIAGNOSIS — E785 Hyperlipidemia, unspecified: Secondary | ICD-10-CM | POA: Diagnosis present

## 2020-08-11 DIAGNOSIS — R739 Hyperglycemia, unspecified: Secondary | ICD-10-CM

## 2020-08-11 DIAGNOSIS — Z79890 Hormone replacement therapy (postmenopausal): Secondary | ICD-10-CM

## 2020-08-11 DIAGNOSIS — E876 Hypokalemia: Secondary | ICD-10-CM | POA: Diagnosis present

## 2020-08-11 DIAGNOSIS — E039 Hypothyroidism, unspecified: Secondary | ICD-10-CM | POA: Diagnosis present

## 2020-08-11 DIAGNOSIS — E1165 Type 2 diabetes mellitus with hyperglycemia: Secondary | ICD-10-CM

## 2020-08-11 DIAGNOSIS — Z7984 Long term (current) use of oral hypoglycemic drugs: Secondary | ICD-10-CM

## 2020-08-11 DIAGNOSIS — I10 Essential (primary) hypertension: Secondary | ICD-10-CM | POA: Diagnosis present

## 2020-08-11 DIAGNOSIS — Z9119 Patient's noncompliance with other medical treatment and regimen: Secondary | ICD-10-CM

## 2020-08-11 DIAGNOSIS — E1110 Type 2 diabetes mellitus with ketoacidosis without coma: Principal | ICD-10-CM | POA: Diagnosis present

## 2020-08-11 DIAGNOSIS — Z833 Family history of diabetes mellitus: Secondary | ICD-10-CM

## 2020-08-11 DIAGNOSIS — M810 Age-related osteoporosis without current pathological fracture: Secondary | ICD-10-CM | POA: Diagnosis present

## 2020-08-11 DIAGNOSIS — Z794 Long term (current) use of insulin: Secondary | ICD-10-CM

## 2020-08-11 DIAGNOSIS — F320 Major depressive disorder, single episode, mild: Secondary | ICD-10-CM | POA: Diagnosis present

## 2020-08-11 DIAGNOSIS — Z8249 Family history of ischemic heart disease and other diseases of the circulatory system: Secondary | ICD-10-CM

## 2020-08-11 LAB — COMPREHENSIVE METABOLIC PANEL
ALT: 44 U/L (ref 0–44)
AST: 30 U/L (ref 15–41)
Albumin: 4.5 g/dL (ref 3.5–5.0)
Alkaline Phosphatase: 93 U/L (ref 38–126)
Anion gap: 17 — ABNORMAL HIGH (ref 5–15)
BUN: 13 mg/dL (ref 6–20)
CO2: 21 mmol/L — ABNORMAL LOW (ref 22–32)
Calcium: 9.9 mg/dL (ref 8.9–10.3)
Chloride: 93 mmol/L — ABNORMAL LOW (ref 98–111)
Creatinine, Ser: 1.04 mg/dL — ABNORMAL HIGH (ref 0.44–1.00)
GFR, Estimated: >60 mL/min (ref >60)
Glucose, Bld: 614 mg/dL (ref 70–99)
Potassium: 5 mmol/L (ref 3.5–5.1)
Sodium: 131 mmol/L — ABNORMAL LOW (ref 135–145)
Total Bilirubin: 2.1 mg/dL — ABNORMAL HIGH (ref 0.3–1.2)
Total Protein: 8.4 g/dL — ABNORMAL HIGH (ref 6.5–8.1)

## 2020-08-11 LAB — CBC WITH DIFFERENTIAL/PLATELET
Abs Immature Granulocytes: 0.03 10*3/uL (ref 0.00–0.07)
Basophils Absolute: 0.1 10*3/uL (ref 0.0–0.1)
Basophils Relative: 1 %
Eosinophils Absolute: 0.1 10*3/uL (ref 0.0–0.5)
Eosinophils Relative: 1 %
HCT: 54.7 % — ABNORMAL HIGH (ref 36.0–46.0)
Hemoglobin: 16.7 g/dL — ABNORMAL HIGH (ref 12.0–15.0)
Immature Granulocytes: 0 %
Lymphocytes Relative: 39 %
Lymphs Abs: 3.8 10*3/uL (ref 0.7–4.0)
MCH: 23.7 pg — ABNORMAL LOW (ref 26.0–34.0)
MCHC: 30.5 g/dL (ref 30.0–36.0)
MCV: 77.6 fL — ABNORMAL LOW (ref 80.0–100.0)
Monocytes Absolute: 0.6 10*3/uL (ref 0.1–1.0)
Monocytes Relative: 6 %
Neutro Abs: 5.2 10*3/uL (ref 1.7–7.7)
Neutrophils Relative %: 53 %
Platelets: 151 10*3/uL (ref 150–400)
RBC: 7.05 MIL/uL — ABNORMAL HIGH (ref 3.87–5.11)
RDW: 18.3 % — ABNORMAL HIGH (ref 11.5–15.5)
WBC: 9.8 10*3/uL (ref 4.0–10.5)
nRBC: 0 % (ref 0.0–0.2)

## 2020-08-11 LAB — URINALYSIS, ROUTINE W REFLEX MICROSCOPIC
Bacteria, UA: NONE SEEN
Bilirubin Urine: NEGATIVE
Glucose, UA: >=500 mg/dL — AB
Hgb urine dipstick: NEGATIVE
Ketones, ur: 20 mg/dL — AB
Leukocytes,Ua: NEGATIVE
Nitrite: NEGATIVE
Protein, ur: NEGATIVE mg/dL
Specific Gravity, Urine: 1.03 (ref 1.005–1.030)
pH: 6 (ref 5.0–8.0)

## 2020-08-11 LAB — CBG MONITORING, ED
Glucose-Capillary: >600 mg/dL (ref 70–99)
Glucose-Capillary: 364 mg/dL — ABNORMAL HIGH (ref 70–99)
Glucose-Capillary: 242 mg/dL — ABNORMAL HIGH (ref 70–99)

## 2020-08-11 LAB — HCG, SERUM, QUALITATIVE: Preg, Serum: NEGATIVE

## 2020-08-11 LAB — BLOOD GAS, VENOUS
Acid-base deficit: 1.6 mmol/L (ref 0.0–2.0)
Bicarbonate: 20.7 mmol/L (ref 20.0–28.0)
FIO2: 21
O2 Saturation: 48.1 %
Patient temperature: 37
pCO2, Ven: 50.9 mmHg (ref 44.0–60.0)
pH, Ven: 7.295 (ref 7.250–7.430)
pO2, Ven: <31 mmHg — CL (ref 32.0–45.0)

## 2020-08-11 LAB — POC URINE PREG, ED: Preg Test, Ur: NEGATIVE

## 2020-08-11 LAB — BETA-HYDROXYBUTYRIC ACID: Beta-Hydroxybutyric Acid: 5.39 mmol/L — ABNORMAL HIGH (ref 0.05–0.27)

## 2020-08-11 LAB — RESP PANEL BY RT-PCR (FLU A&B, COVID) ARPGX2
Influenza A by PCR: NEGATIVE
Influenza B by PCR: NEGATIVE
SARS Coronavirus 2 by RT PCR: NEGATIVE

## 2020-08-11 LAB — GLUCOSE, CAPILLARY: Glucose-Capillary: 144 mg/dL — ABNORMAL HIGH (ref 70–99)

## 2020-08-11 MED ORDER — LACTATED RINGERS IV SOLN: INTRAVENOUS | Status: DC

## 2020-08-11 MED ORDER — DEXTROSE IN LACTATED RINGERS 5 % IV SOLN: INTRAVENOUS | Status: DC

## 2020-08-11 MED ORDER — INSULIN REGULAR(HUMAN) IN NACL 100-0.9 UT/100ML-% IV SOLN: INTRAVENOUS | Status: DC

## 2020-08-11 MED ORDER — POTASSIUM CHLORIDE 10 MEQ/100ML IV SOLN
10.0000 meq | INTRAVENOUS | Status: AC
  Administered 2020-08-11 (×2): 10 meq via INTRAVENOUS
  Filled 2020-08-11: qty 100

## 2020-08-11 MED ORDER — DEXTROSE 50 % IV SOLN: 0.0000 mL | INTRAVENOUS | Status: DC | PRN

## 2020-08-11 MED ORDER — ENOXAPARIN SODIUM 40 MG/0.4ML IJ SOSY
40.0000 mg | PREFILLED_SYRINGE | INTRAMUSCULAR | Status: DC
  Administered 2020-08-12 – 2020-08-13 (×2): 40 mg via SUBCUTANEOUS
  Filled 2020-08-11 (×2): qty 0.4

## 2020-08-11 MED ORDER — SODIUM CHLORIDE 0.9 % IV BOLUS
1000.0000 mL | Freq: Once | INTRAVENOUS | Status: AC
  Administered 2020-08-11: 1000 mL via INTRAVENOUS

## 2020-08-11 MED ORDER — ALBUTEROL SULFATE (2.5 MG/3ML) 0.083% IN NEBU: 2.5000 mg | INHALATION_SOLUTION | RESPIRATORY_TRACT | Status: DC | PRN

## 2020-08-11 MED ORDER — LACTATED RINGERS IV BOLUS
20.0000 mL/kg | Freq: Once | INTRAVENOUS | Status: DC
Start: 2020-08-11 — End: 2020-08-12

## 2020-08-11 MED ORDER — CHLORHEXIDINE GLUCONATE CLOTH 2 % EX PADS
6.0000 | MEDICATED_PAD | Freq: Every day | CUTANEOUS | Status: DC
  Administered 2020-08-11: 6 via TOPICAL

## 2020-08-11 MED ORDER — LACTATED RINGERS IV BOLUS
20.0000 mL/kg | Freq: Once | INTRAVENOUS | Status: AC
  Administered 2020-08-11: 2358 mL via INTRAVENOUS

## 2020-08-11 MED ORDER — INSULIN REGULAR(HUMAN) IN NACL 100-0.9 UT/100ML-% IV SOLN
INTRAVENOUS | Status: DC
  Administered 2020-08-11: 18 [IU]/h via INTRAVENOUS
  Filled 2020-08-11: qty 100

## 2020-08-11 MED ORDER — POTASSIUM CHLORIDE 10 MEQ/100ML IV SOLN
INTRAVENOUS | Status: AC
  Filled 2020-08-11: qty 100

## 2020-08-11 NOTE — ED Notes (Signed)
Pt to bathroom via wheelchair.

## 2020-08-11 NOTE — Plan of Care (Signed)

## 2020-08-11 NOTE — H&P (Addendum)
History and Physical    [name] [name]:[id number] DOB: Dec 14, 1980 DOA: 08/11/2020  PCP: Michelle Dance, FNP   Patient coming from: Home  I have personally briefly reviewed patient's old medical records in [facility]  Chief Complaint: Dizziness, blurry vision  HPI: [name] is a 40 y.o. female with medical history significant for diabetes mellitus, hypertension, depression, OSA, hypothyroidism. Patient presented to the ED with reports of dizziness/lightheadedness, headaches, blurry vision over the past several days.  She went to the health department today, and was sent to the ED due to high blood sugars.  Patient has not taking her medications for her diabetes in over a year.  She reports she could not afford them.  She does not work.  She is supposed to be on insulin, metformin and Victoza.  ED Course: Stable vitals.  Glucose 614.  Anion gap of 17.  Serum bicarb 21.  Hemoglobin 16.7.  Venous blood gas shows pH of 7.29.  Hydroxybutyrate acid elevated at 5.39.  2.3 L bolus given.  Hospitalist to admit.  Review of Systems: As per HPI all other systems reviewed and negative.  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 01/29/2020   01/2020 pap +HPV LSIL, get colpo per ASCCP guidelines immediate risk CIN3+ 4.3 %   Arthritis    Asthma    Cervical dysplasia 05/03/2013   2015 Unsatisfactory cytology, +HPV with negative subsequent colposcopy 04/30/2013 NIELM (no mention of HPV) 05/06/2014  NIELM, +HPV 09/19/2016 LGSIL, +HPV (no mention of colposcopy) 10/07/2017 ASCUS, +HPV   Chondromalacia of patellofemoral joint, right    Depression    Phreesia 09/05/2019   Diabetes mellitus without complication (HCC)    Headache    Hyperlipidemia    Phreesia 09/05/2019   Hypertension    Hypothyroidism    Osteoporosis    S/P right knee arthroscopy    Thyroid disease     Past Surgical History:   Procedure Laterality Date   extra digit removed Left    KNEE ARTHROSCOPY WITH MEDIAL MENISECTOMY Right 08/02/2016   Procedure: chondroplasty;  Surgeon: Vickki Hearing, MD;  Location: [facility];  Service: Orthopedics;  Laterality: Right;     reports that she has been smoking cigarettes. She has a 19.50 pack-year smoking history. She has never used smokeless tobacco. She reports current alcohol use. She reports that she does not use drugs.  No Known Allergies  Family History  Problem Relation Age of Onset   Hypothyroidism Mother    Hypertension Other    Diabetes Maternal Grandmother     Prior to Admission medications   Medication Sig Start Date End Date Taking? Authorizing Provider  albuterol (VENTOLIN HFA) 108 (90 Base) MCG/ACT inhaler Inhale 2 puffs into the lungs every 6 (six) hours as needed for wheezing or shortness of breath.    [provider]  atorvastatin (LIPITOR) 40 MG tablet TAKE 1 TABLET BY MOUTH NIGHTLY **NEED APPOINTMENT 01/06/20 01/05/21  Cobb, Glenice Laine, FNP  atorvastatin (LIPITOR) 80 MG tablet Take 1 tablet (80 mg total) by mouth daily. 12/15/19   Freddy Finner, NP  carvedilol (COREG) 12.5 MG tablet Take 1 tablet (12.5 mg total) by mouth daily. 12/15/19   Freddy Finner, NP  carvedilol (COREG) 25 MG tablet TAKE 1 TABLET BY MOUTH TWICE A DAYDUE FOR APPT 01/06/20 01/05/21  Cobb, Glenice Laine, FNP  Cholecalciferol (VITAMIN D3) 1.25 MG (50000 UT) CAPS TAKE  1 CAPSULE BY MOUTH WEEKLY 01/06/20 01/05/21  Cobb, Glenice Laine, FNP  Cholecalciferol (VITAMIN D3) 1.25 MG (50000 UT) CAPS TAKE 1 CAPSULE BY MOUTH WEEKLY 11/20/19 11/19/20  Cobb, Glenice Laine, FNP  escitalopram (LEXAPRO) 10 MG tablet TAKE 1 TABLET BY MOUTH ONCE A DAY 12/15/19 12/14/20  Freddy Finner, NP  FREESTYLE LITE test strip 1 each by Other route in the morning and at bedtime. 03/25/19   [provider]  gabapentin (NEURONTIN) 100 MG capsule TAKE 1 CAPSULE BY MOUTH AT BEDTIME 12/15/19 12/14/20  Freddy Finner, NP  Insulin Pen Needle 32G X 6 MM MISC USE AS DIRECTED 10/21/19 10/20/20  Sharlene Dory, NP  Lancets (FREESTYLE) lancets 1 each by Other route in the morning and at bedtime. 03/25/19   [provider]  Levonorgestrel-Ethinyl Estradiol (AMETHIA) 0.15-0.03 &0.01 MG tablet TAKE 1 TABLET BY MOUTH DAILY 11/20/19 11/19/20  Cobb, Glenice Laine, FNP  levothyroxine (SYNTHROID) 25 MCG tablet TAKE 1 TABLET BY MOUTH ONCE A DAY BEFORE BREAKFAST 12/15/19 12/14/20  Freddy Finner, NP  levothyroxine (SYNTHROID) 25 MCG tablet TAKE 1 TABLET BY MOUTH DAILY 11/20/19 11/19/20  Cobb, Glenice Laine, FNP  liraglutide (VICTOZA) 18 MG/3ML SOPN INJECT 1.2 MG UNDER THE SKIN DAILY 05/05/19 05/04/20  Cobb, Glenice Laine, FNP  losartan (COZAAR) 50 MG tablet TAKE 1 TABLET BY MOUTH ONCE A DAY 12/15/19 12/14/20  Freddy Finner, NP  metFORMIN (GLUCOPHAGE) 1000 MG tablet Take 1 tablet (1,000 mg total) by mouth 2 (two) times daily. 12/15/19   Freddy Finner, NP  metFORMIN (GLUCOPHAGE) 1000 MG tablet TAKE 1 TABLET BY MOUTH 2 TIMES DAILY * NEEDS APPT 01/06/20 01/05/21  Cobb, Glenice Laine, FNP  NOVOFINE 32G X 6 MM MISC 1 each by Other route See admin instructions. For Victoza pen 04/20/19   [provider]  VICTOZA 18 MG/3ML SOPN Inject 1.2 mg into the skin daily. 12/15/19   Freddy Finner, NP    Physical Exam: Vitals:   08/11/20 1628 08/11/20 1632 08/11/20 1633 08/11/20 1732  BP:  (!) 138/98 (!) 138/98 (!) 138/98  Pulse:  98 97 91  Resp:  16 16 19   Temp:  98.2 F (36.8 C) 98.8 F (37.1 C)   TempSrc:   Oral   SpO2:  96% 100% 97%  Weight: 117.9 kg     Height: 5\' 3"  (1.6 m)       Constitutional: NAD, calm, comfortable Vitals:   08/11/20 1628 08/11/20 1632 08/11/20 1633 08/11/20 1732  BP:  (!) 138/98 (!) 138/98 (!) 138/98  Pulse:  98 97 91  Resp:  16 16 19   Temp:  98.2 F (36.8 C) 98.8 F (37.1 C)   TempSrc:   Oral   SpO2:  96% 100% 97%  Weight: 117.9 kg     Height: 5\' 3"  (1.6 m)      Eyes: PERRL, lids  and conjunctivae normal ENMT: Mucous membranes are dry.  Neck: normal, supple, no masses, no thyromegaly Respiratory: clear to auscultation bilaterally, no wheezing, no crackles. Normal respiratory effort. No accessory muscle use.  Cardiovascular: Regular rate and rhythm, no murmurs / rubs / gallops. No extremity edema. 2+ pedal pulses.  Abdomen: no tenderness, no masses palpated. No hepatosplenomegaly. Bowel sounds positive.  Musculoskeletal: no clubbing / cyanosis. No joint deformity upper and lower extremities. Good ROM, no contractures. Normal muscle tone.  Skin: no rashes, lesions, ulcers. No induration Neurologic: No apparent cranial formality, moving extremities spontaneously.  Psychiatric: Normal judgment and insight. Alert  and oriented x 3. Normal mood.   Labs on Admission: I have personally reviewed following labs and imaging studies  Basic Metabolic Panel: Recent Labs  Lab 08/11/20 1726  NA 131*  K 5.0  CL 93*  CO2 21*  GLUCOSE 614*  BUN 13  CREATININE 1.04*  CALCIUM 9.9   Liver Function Tests: Recent Labs  Lab 08/11/20 1726  AST 30  ALT 44  ALKPHOS 93  BILITOT 2.1*  PROT 8.4*  ALBUMIN 4.5   CBG: Recent Labs  Lab 08/11/20 1629  GLUCAP >600*   Urine analysis:    Component Value Date/Time   COLORURINE STRAW (A) 08/11/2020 1720   APPEARANCEUR CLEAR 08/11/2020 1720   LABSPEC 1.030 08/11/2020 1720   PHURINE 6.0 08/11/2020 1720   GLUCOSEU >=500 (A) 08/11/2020 1720   HGBUR NEGATIVE 08/11/2020 1720   BILIRUBINUR NEGATIVE 08/11/2020 1720   KETONESUR 20 (A) 08/11/2020 1720   PROTEINUR NEGATIVE 08/11/2020 1720   UROBILINOGEN 1.0 09/02/2011 0058   NITRITE NEGATIVE 08/11/2020 1720   LEUKOCYTESUR NEGATIVE 08/11/2020 1720    Radiological Exams on Admission: No results found.  EKG: Independently reviewed.  Sinus rhythm rate 99.  QTc 433.  No significant change from prior.  Assessment/Plan Principal Problem:   DKA (diabetic ketoacidosis) (HCC) Active  Problems:   Morbid obesity with body mass index (BMI) of 45.0 to 49.9 in adult [facility])   Depression, major, single episode, mild (HCC)   Type 2 diabetes mellitus with hyperglycemia, without long-term current use of insulin (HCC)   Essential hypertension   Asthma  DKA- blood glucose 614, anion gap of 17, serum bicarb 21.  Venous blood glucose pH of 7.29.  Elevated BHA at 5.39.  Due to noncompliance, patient is supposed to be on insulin, has not taking medications in at least 1 year. -Diabetes coordinator, TOC consult -Continue insulin drip - Total 3.5 L to be given, continue RL 150cc/hr -DKA protocol -Monitor BMP closely  Pseudohyponatremia-131, corrected sodium 139.  On carvedilol and losartan. -Hydrate  Hypertension-stable.  Patient is supposed to be on carvedilol, losartan.  Asthma- Stable -albuterol nebulizer as needed  Morbid obesity  Depression-supposed to be on escitalopram.  Has not taking in at least a year.  Hypothyroidism patient supposed to be on Synthroid.  Dyslipidemia she is supposed to be on atorvastatin  DVT prophylaxis: Lovenox Code Status: Full code Family Communication: None at bedside Disposition Plan: ~ 2 days Consults called: None Admission status: Inpt, Stepdown >> per [facility] request ICU I certify that at the point of admission it is my clinical judgment that the patient will require inpatient hospital care spanning beyond 2 midnights from the point of admission due to high intensity of service, high risk for further deterioration and high frequency of surveillance required.   Onnie Boer MD [facility]  08/11/2020, 8:38 PM

## 2020-08-11 NOTE — ED Provider Notes (Signed)
[facility] EMERGENCY DEPARTMENT Provider Note   CSN: [id number] Arrival date & time: 08/11/20  1548     History Chief Complaint  Patient presents with   Hyperglycemia    [name] is a 40 y.o. female.  HPI  Patient presents with hyperglycemia.  She was seen at health department earlier today, she is diabetic with insulin dependency.  She has been without her medicine for a year due to cost.  States that she has been having intermittent headaches, blurry vision, feeling faint and lightheaded.  She is also having muscle cramps.  She denies any chest pain or shortness of breath, no recent surgeries, not on any oral birth control.  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 01/29/2020   01/2020 pap +HPV LSIL, get colpo per ASCCP guidelines immediate risk CIN3+ 4.3 %   Arthritis    Asthma    Cervical dysplasia 05/03/2013   2015 Unsatisfactory cytology, +HPV with negative subsequent colposcopy 04/30/2013 NIELM (no mention of HPV) 05/06/2014  NIELM, +HPV 09/19/2016 LGSIL, +HPV (no mention of colposcopy) 10/07/2017 ASCUS, +HPV   Chondromalacia of patellofemoral joint, right    Depression    Phreesia 09/05/2019   Diabetes mellitus without complication (HCC)    Headache    Hyperlipidemia    Phreesia 09/05/2019   Hypertension    Hypothyroidism    Osteoporosis    S/P right knee arthroscopy    Thyroid disease     Patient Active Problem List   Diagnosis Date Noted   Abnormal Papanicolaou smear of cervix with positive human papilloma virus (HPV) test 01/29/2020   Encounter for gynecological examination with Papanicolaou smear of cervix 01/19/2020   Nicotine abuse 12/15/2019   Depression, major, single episode, mild (HCC) 09/08/2019   Type 2 diabetes mellitus with hyperglycemia, without long-term current use of insulin (HCC) 09/08/2019   Need for immunization against influenza 09/08/2019   Essential  hypertension 09/08/2019   Hyperlipidemia 09/08/2019   Hypothyroidism 09/08/2019   Abnormal cervical Papanicolaou smear 09/08/2019   Other fatigue 09/08/2019   Morbid obesity with body mass index (BMI) of 45.0 to 49.9 in adult [facility]) 03/12/2019   OSA (obstructive sleep apnea) 12/11/2016   Asthma 10/17/2010   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    Past Surgical History:  Procedure Laterality Date   extra digit removed Left    KNEE ARTHROSCOPY WITH MEDIAL MENISECTOMY Right 08/02/2016   Procedure: chondroplasty;  Surgeon: Vickki Hearing, MD;  Location: [facility];  Service: Orthopedics;  Laterality: Right;     OB History     Gravida  0   Para  0   Term  0   Preterm  0   AB  0   Living  0      SAB  0   IAB  0   Ectopic  0   Multiple  0   Live Births              Family History  Problem Relation Age of Onset   Hypothyroidism Mother    Hypertension Other    Diabetes Maternal Grandmother     Social History   Tobacco Use   Smoking status: Every Day    Packs/day: 1.50    Years: 13.00    Pack years: 19.50    Types: Cigarettes   Smokeless tobacco: Never  Vaping Use   Vaping Use: Never used  Substance Use Topics  Alcohol use: Yes    Comment: Social drinker    Drug use: No    Home Medications   Allergies    Patient has no known allergies.  Review of Systems   Review of Systems  Eyes:  Positive for visual disturbance.  Respiratory:  Negative for shortness of breath.   Cardiovascular:  Negative for chest pain.  Musculoskeletal:  Positive for myalgias.  Neurological:  Positive for weakness and headaches. Negative for syncope.   Physical Exam Updated Vital Signs BP (!) 138/98 (BP Location: Right Arm)   Pulse 97   Temp 98.8 F (37.1 C) (Oral)   Resp 16   Ht 5\' 3"  (1.6 m)   Wt 117.9 kg   LMP 06/29/2020   SpO2 100%   BMI 46.06 kg/m   Physical Exam Vitals and nursing note reviewed. Exam conducted with a chaperone present.  Constitutional:       Appearance: Normal appearance. She is obese.     Comments: Resting comfortably, but appears fatigued  HENT:     Head: Normocephalic and atraumatic.  Eyes:     General: No scleral icterus.       Right eye: No discharge.        Left eye: No discharge.     Extraocular Movements: Extraocular movements intact.     Pupils: Pupils are equal, round, and reactive to light.  Cardiovascular:     Rate and Rhythm: Normal rate and regular rhythm.     Pulses: Normal pulses.     Heart sounds: Normal heart sounds. No murmur heard.   No friction rub. No gallop.  Pulmonary:     Effort: Pulmonary effort is normal. No respiratory distress.     Breath sounds: Normal breath sounds.  Abdominal:     General: Abdomen is flat. Bowel sounds are normal. There is no distension.     Palpations: Abdomen is soft.     Tenderness: There is no abdominal tenderness.  Musculoskeletal:     Comments: No edema legs are roughly symmetrical  Skin:    General: Skin is warm and dry.     Coloration: Skin is not jaundiced.  Neurological:     Mental Status: She is alert. Mental status is at baseline.     Coordination: Coordination normal.    ED Results / Procedures / Treatments   Labs (all labs ordered are listed, but only abnormal results are displayed) Labs Reviewed  CBG MONITORING, ED - Abnormal; Notable for the following components:      Result Value   Glucose-Capillary >600 (*)    All other components within normal limits  BETA-HYDROXYBUTYRIC ACID  BETA-HYDROXYBUTYRIC ACID  POC URINE PREG, ED    EKG EKG Interpretation  Date/Time:  Thursday August 11 2020 16:32:22 EDT Ventricular Rate:  99 PR Interval:  135 QRS Duration: 90 QT Interval:  337 QTC Calculation: 433 R Axis:   59 Text Interpretation: Sinus rhythm Consider right atrial enlargement Borderline T wave abnormalities Similar to 2021 tracing. Confirmed by 2022 [phone]) on 08/11/2020 4:34:44 PM  Radiology No results  found.  Procedures Procedures   Medications Ordered in ED Medications  lactated ringers bolus 2,358 mL (has no administration in time range)    ED Course  I have reviewed the triage vital signs and the nursing notes.  Pertinent labs & imaging results that were available during my care of the patient were reviewed by me and considered in my medical decision making (see chart for details).  Clinical  Course as of 08/11/20 1858  Thu Aug 11, 2020  1750 POC urine preg, ED No ectopic pregnancy  [HS]  1816 Urinalysis, Routine w reflex microscopic Urine, Clean Catch(!) Significant glucose in the urine and ketones, consistent with DKA.  Still waiting on rest of serologies to result. [HS]  1820 Comprehensive metabolic panel(!!) Patient is in DKA [HS]    Clinical Course User Index [HS] Theron Arista, PA-C   MDM Rules/Calculators/A&P                           Suspect DKA as lab results have now confirm.  Patient is resting comfortably, she is alert and awake.  I will start her on insulin drip given that the results have shown significant hyperglycemia.  Will consult with hospitalist for admission until sugars normalize.  Also put in an order with social work consult to set up medications outpatient.   Spoke with hospitalist Dr. Mariea Clonts who is happy to admit the patient.  Final Clinical Impression(s) / ED Diagnoses Final diagnoses:  None    Rx / DC Orders ED Discharge Orders     None        Theron Arista, Cordelia Poche 08/11/20 Edd Arbour, MD 08/15/20 1300

## 2020-08-11 NOTE — ED Notes (Signed)
Date and time results received: 08/11/20 1819  Test: Glucose Critical Value: 614  Name of Provider Notified: Theron Arista, [place]

## 2020-08-11 NOTE — ED Triage Notes (Signed)
Pt sent from [facility] for hyperglycemia; PA there called report and states pt has been unable to obtain DM meds for quite some time, has ketones 2+ and CBG reading "high"

## 2020-08-12 DIAGNOSIS — E0810 Diabetes mellitus due to underlying condition with ketoacidosis without coma: Secondary | ICD-10-CM

## 2020-08-12 DIAGNOSIS — E1165 Type 2 diabetes mellitus with hyperglycemia: Secondary | ICD-10-CM

## 2020-08-12 DIAGNOSIS — R739 Hyperglycemia, unspecified: Secondary | ICD-10-CM

## 2020-08-12 LAB — HIV ANTIBODY (ROUTINE TESTING W REFLEX): HIV Screen 4th Generation wRfx: NONREACTIVE

## 2020-08-12 LAB — BASIC METABOLIC PANEL
Anion gap: 11 (ref 5–15)
Anion gap: 8 (ref 5–15)
BUN: 9 mg/dL (ref 6–20)
BUN: 8 mg/dL (ref 6–20)
CO2: 26 mmol/L (ref 22–32)
CO2: 28 mmol/L (ref 22–32)
Calcium: 9.2 mg/dL (ref 8.9–10.3)
Calcium: 8.8 mg/dL — ABNORMAL LOW (ref 8.9–10.3)
Chloride: 102 mmol/L (ref 98–111)
Chloride: 104 mmol/L (ref 98–111)
Creatinine, Ser: 0.87 mg/dL (ref 0.44–1.00)
Creatinine, Ser: 0.7 mg/dL (ref 0.44–1.00)
GFR, Estimated: >60 mL/min (ref >60)
GFR, Estimated: >60 mL/min (ref >60)
Glucose, Bld: 206 mg/dL — ABNORMAL HIGH (ref 70–99)
Glucose, Bld: 133 mg/dL — ABNORMAL HIGH (ref 70–99)
Potassium: 3.4 mmol/L — ABNORMAL LOW (ref 3.5–5.1)
Potassium: 3.2 mmol/L — ABNORMAL LOW (ref 3.5–5.1)
Sodium: 139 mmol/L (ref 135–145)
Sodium: 140 mmol/L (ref 135–145)

## 2020-08-12 LAB — GLUCOSE, CAPILLARY
Glucose-Capillary: 169 mg/dL — ABNORMAL HIGH (ref 70–99)
Glucose-Capillary: 157 mg/dL — ABNORMAL HIGH (ref 70–99)
Glucose-Capillary: 155 mg/dL — ABNORMAL HIGH (ref 70–99)
Glucose-Capillary: 159 mg/dL — ABNORMAL HIGH (ref 70–99)
Glucose-Capillary: 128 mg/dL — ABNORMAL HIGH (ref 70–99)
Glucose-Capillary: 145 mg/dL — ABNORMAL HIGH (ref 70–99)
Glucose-Capillary: 138 mg/dL — ABNORMAL HIGH (ref 70–99)
Glucose-Capillary: 163 mg/dL — ABNORMAL HIGH (ref 70–99)
Glucose-Capillary: 160 mg/dL — ABNORMAL HIGH (ref 70–99)
Glucose-Capillary: 294 mg/dL — ABNORMAL HIGH (ref 70–99)
Glucose-Capillary: 365 mg/dL — ABNORMAL HIGH (ref 70–99)

## 2020-08-12 LAB — BETA-HYDROXYBUTYRIC ACID
Beta-Hydroxybutyric Acid: 3.22 mmol/L — ABNORMAL HIGH (ref 0.05–0.27)
Beta-Hydroxybutyric Acid: 0.69 mmol/L — ABNORMAL HIGH (ref 0.05–0.27)

## 2020-08-12 LAB — MRSA NEXT GEN BY PCR, NASAL: MRSA by PCR Next Gen: NOT DETECTED

## 2020-08-12 MED ORDER — ACETAMINOPHEN 325 MG PO TABS: 650.0000 mg | ORAL_TABLET | Freq: Four times a day (QID) | ORAL | Status: DC | PRN

## 2020-08-12 MED ORDER — INSULIN GLARGINE-YFGN 100 UNIT/ML ~~LOC~~ SOLN
20.0000 [IU] | Freq: Every day | SUBCUTANEOUS | Status: DC
  Administered 2020-08-12: 20 [IU] via SUBCUTANEOUS
  Filled 2020-08-12 (×4): qty 0.2

## 2020-08-12 MED ORDER — ONDANSETRON HCL 4 MG/2ML IJ SOLN: 4.0000 mg | Freq: Four times a day (QID) | INTRAMUSCULAR | Status: DC | PRN

## 2020-08-12 MED ORDER — POTASSIUM CHLORIDE CRYS ER 20 MEQ PO TBCR
40.0000 meq | EXTENDED_RELEASE_TABLET | Freq: Once | ORAL | Status: AC
  Administered 2020-08-12: 40 meq via ORAL
  Filled 2020-08-12: qty 2

## 2020-08-12 MED ORDER — LACTATED RINGERS IV SOLN: INTRAVENOUS | Status: DC

## 2020-08-12 MED ORDER — INSULIN ASPART 100 UNIT/ML IJ SOLN
0.0000 [IU] | Freq: Every day | INTRAMUSCULAR | Status: DC
  Administered 2020-08-12: 5 [IU] via SUBCUTANEOUS

## 2020-08-12 MED ORDER — INSULIN ASPART 100 UNIT/ML IJ SOLN
0.0000 [IU] | Freq: Three times a day (TID) | INTRAMUSCULAR | Status: DC
  Administered 2020-08-12: 8 [IU] via SUBCUTANEOUS
  Administered 2020-08-12: 3 [IU] via SUBCUTANEOUS
  Administered 2020-08-13 (×2): 8 [IU] via SUBCUTANEOUS

## 2020-08-12 MED ORDER — INSULIN STARTER KIT- PEN NEEDLES (ENGLISH)
1.0000 | Freq: Once | Status: DC
  Filled 2020-08-12: qty 1

## 2020-08-12 MED ORDER — LIVING WELL WITH DIABETES BOOK: Freq: Once | Status: AC

## 2020-08-12 MED ORDER — AMLODIPINE BESYLATE 5 MG PO TABS
2.5000 mg | ORAL_TABLET | Freq: Every day | ORAL | Status: DC
  Administered 2020-08-12 – 2020-08-13 (×2): 2.5 mg via ORAL
  Filled 2020-08-12 (×2): qty 1

## 2020-08-12 NOTE — Progress Notes (Addendum)
Inpatient Diabetes Program Recommendations  AACE/ADA: New Consensus Statement on Inpatient Glycemic Control (2015)  Target Ranges:  Prepandial:   less than 140 mg/dL      Peak postprandial:   less than 180 mg/dL (1-2 hours)      Critically ill patients:  140 - 180 mg/dL   Lab Results  Component Value Date   GLUCAP 145 (H) 08/12/2020   HGBA1C 7.7 (A) 12/15/2019    Review of Glycemic Control  Diabetes history: DM2 Outpatient Diabetes medications: None Current orders for Inpatient glycemic control: Transitioning from IV insulin to Semglee 20 units QD, Novolog 0-15 units TID and 0-5 units QHS  Referral received for DM evaluation.  Agree with above transition regimen.  Current A1C is pending. Ordered LWWD booklet and insulin pen starter kit. Placed consult for dietician. Attempted to call patient with no answer.  Will try again later today.   Addendum@14:51- Spoke with patient over the phone as this DM coordinator is working from the main campus.  Spoke with her about her diabetes.  She states she has not taken her medications in > 1 year.  She used to take Victoza and Metformin.  She states she could not afford them.  Explained importance of good glucose control and long and short term complications.  She admits to drinking  beverages with sugar and not limiting her CHO's.  The dietician has met with her today.  Current A1C is pending.  Will likely need insulin at discharge.  Sent secure chat to RN asking her to start teaching insulin administration with the pen station.  Will need affordable insulin at discharge.  Will also need a prescription for a glucometer.  Per TOC CM, Rebecca if the physician writes a prescription for a glucometer the patient can take the prescription to [facility] and she will be given a coupon for a free meter.  She will be given a MATCH for insulins/medications.  She will follow up in [place] where she lives per TOC.  Ordered LWWD booklet and insulin starter kit.  Will  follow and make recommendations once insulin needs are identified.    Will continue to follow while inpatient.  Thank you, Jen Miller, RN, BSN Diabetes Coordinator Inpatient Diabetes Program [phone] (team pager from 8a-5p)      

## 2020-08-12 NOTE — Progress Notes (Addendum)
PROGRESS NOTE  [name] FFM:[id number] DOB: 25-Jun-1980 DOA: 08/11/2020 PCP: Fanny Dance, FNP  Brief History:  40 year old female with a history of diabetes mellitus type 2, hypertension, depression, OSA, hypothyroidism, hyperlipidemia presenting with dizziness and lightheadedness and blurry vision for 3 to 4 days.  She denies any fevers, chills, chest pain, shortness breath, nausea, vomiting, diarrhea or abdominal pain.  She denies any focal extremity weakness.  There is no hematochezia, melena, hematuria, hematemesis.  Patient states that she has been off all her medications for at least 1 year.  She was previously on metformin and Victoza. In the emergency department, the patient was afebrile hemodynamically stable with oxygen saturation 98%.  BMP showed a serum glucose of 614 with anion gap of 17.  WBC 9.8, hemoglobin 16.7, platelets 151,000. The patient was started on intravenous insulin with IV fluids.  Assessment/Plan: DKA type 2 -patient started on IV insulin with q 1 hour CBG check and q 4 hour BMPs -pt started on aggressive fluid resuscitation -Electrolytes were monitored and repleted -transitioned to [place] insulin once anion gap closed -diet was advanced once anion gap closed -HbA1C--  Essential hypertension -start amlodipine  Morbid obesity -BMI 42.4 -Lifestyle modification  Hypothyroidism -Continue Synthroid  Hyperlipidemia -Continue statin  Hypokalemia -replete -check mag      Status is: Inpatient  Remains inpatient appropriate because:IV treatments appropriate due to intensity of illness or inability to take PO  Dispo: The patient is from: Home              Anticipated d/c is to: Home              Patient currently is not medically stable to d/c.   Difficult to place patient No        Family Communication:   no Family at bedside  Consultants:  none  Code Status:  FULL   DVT Prophylaxis:   [place] Lovenox   Procedures: As  Listed in Progress Note Above  Antibiotics: None    Subjective: Patient denies fevers, chills, headache, chest pain, dyspnea, nausea, vomiting, diarrhea, abdominal pain, dysuria, hematuria, hematochezia, and melena.   Objective: Vitals:   08/12/20 0600 08/12/20 0700 08/12/20 0753 08/12/20 0800  BP: 140/71 131/64  (!) 151/93  Pulse: 60 62 63   Resp: 14 13 18 17   Temp:   97.9 F (36.6 C)   TempSrc:   Oral   SpO2: 100% 100% 100%   Weight:      Height:        Intake/Output Summary (Last 24 hours) at 08/12/2020 0904 Last data filed at 08/12/2020 0743 Gross per 24 hour  Intake 1303.35 ml  Output --  Net 1303.35 ml   Weight change:  Exam:  General:  Pt is alert, follows commands appropriately, not in acute distress HEENT: No icterus, No thrush, No neck mass, [place]/AT Cardiovascular: RRR, S1/S2, no rubs, no gallops Respiratory: CTA bilaterally, no wheezing, no crackles, no rhonchi Abdomen: Soft/+BS, non tender, non distended, no guarding Extremities: No edema, No lymphangitis, No petechiae, No rashes, no synovitis   Data Reviewed: I have personally reviewed following labs and imaging studies Basic Metabolic Panel: Recent Labs  Lab 08/11/20 1726 08/11/20 2328 08/12/20 0637  NA 131* 139 140  K 5.0 3.4* 3.2*  CL 93* 102 104  CO2 21* 26 28  GLUCOSE 614* 206* 133*  BUN 13 9 8   CREATININE 1.04* 0.87 0.70  CALCIUM 9.9 9.2  8.8*   Liver Function Tests: Recent Labs  Lab 08/11/20 1726  AST 30  ALT 44  ALKPHOS 93  BILITOT 2.1*  PROT 8.4*  ALBUMIN 4.5   No results for input(s): LIPASE, AMYLASE in the last 168 hours. No results for input(s): AMMONIA in the last 168 hours. Coagulation Profile: No results for input(s): INR, PROTIME in the last 168 hours. CBC: Recent Labs  Lab 08/11/20 1852  WBC 9.8  NEUTROABS 5.2  HGB 16.7*  HCT 54.7*  MCV 77.6*  PLT 151   Cardiac Enzymes: No results for input(s): CKTOTAL, CKMB, CKMBINDEX, TROPONINI in the last 168  hours. BNP: Invalid input(s): POCBNP CBG: Recent Labs  Lab 08/12/20 0232 08/12/20 0333 08/12/20 0540 08/12/20 0748 08/12/20 0847  GLUCAP 157* 155* 159* 128* 145*   HbA1C: No results for input(s): HGBA1C in the last 72 hours. Urine analysis:    Component Value Date/Time   COLORURINE STRAW (A) 08/11/2020 1720   APPEARANCEUR CLEAR 08/11/2020 1720   LABSPEC 1.030 08/11/2020 1720   PHURINE 6.0 08/11/2020 1720   GLUCOSEU >=500 (A) 08/11/2020 1720   HGBUR NEGATIVE 08/11/2020 1720   BILIRUBINUR NEGATIVE 08/11/2020 1720   KETONESUR 20 (A) 08/11/2020 1720   PROTEINUR NEGATIVE 08/11/2020 1720   UROBILINOGEN 1.0 09/02/2011 0058   NITRITE NEGATIVE 08/11/2020 1720   LEUKOCYTESUR NEGATIVE 08/11/2020 1720   Sepsis Labs: @LABRCNTIP (procalcitonin:4,lacticidven:4) ) Recent Results (from the past 240 hour(s))  Resp Panel by RT-PCR (Flu A&B, Covid) Nasopharyngeal Swab     Status: None   Collection Time: 08/11/20  4:45 PM   Specimen: Nasopharyngeal Swab; Nasopharyngeal(NP) swabs in vial transport medium  Result Value Ref Range Status   SARS Coronavirus 2 by RT PCR NEGATIVE NEGATIVE Final    Comment: (NOTE) SARS-CoV-2 target nucleic acids are NOT DETECTED.  The SARS-CoV-2 RNA is generally detectable in upper respiratory specimens during the acute phase of infection. The lowest concentration of SARS-CoV-2 viral copies this assay can detect is 138 copies/mL. A negative result does not preclude SARS-Cov-2 infection and should not be used as the sole basis for treatment or other patient management decisions. A negative result may occur with  improper specimen collection/handling, submission of specimen other than nasopharyngeal swab, presence of viral mutation(s) within the areas targeted by this assay, and inadequate number of viral copies(<138 copies/mL). A negative result must be combined with clinical observations, patient history, and epidemiological information. The expected result  is Negative.  Fact Sheet for Patients:  08/13/20  Fact Sheet for Healthcare Providers:  BloggerCourse.com  This test is no t yet approved or cleared by the [employer]iousBroker.it FDA and  has been authorized for detection and/or diagnosis of SARS-CoV-2 by FDA under an Emergency Use Authorization (EUA). This EUA will remain  in effect (meaning this test can be used) for the duration of the COVID-19 declaration under Section 564(b)(1) of the Act, 21 U.S.C.section 360bbb-3(b)(1), unless the authorization is terminated  or revoked sooner.       Influenza A by PCR NEGATIVE NEGATIVE Final   Influenza B by PCR NEGATIVE NEGATIVE Final    Comment: (NOTE) The Xpert Xpress SARS-CoV-2/FLU/RSV plus assay is intended as an aid in the diagnosis of influenza from Nasopharyngeal swab specimens and should not be used as a sole basis for treatment. Nasal washings and aspirates are unacceptable for Xpert Xpress SARS-CoV-2/FLU/RSV testing.  Fact Sheet for Patients: [place]  Fact Sheet for Healthcare Providers: BloggerCourse.com  This test is not yet approved or cleared by the SeriousBroker.it  FDA and has been authorized for detection and/or diagnosis of SARS-CoV-2 by FDA under an Emergency Use Authorization (EUA). This EUA will remain in effect (meaning this test can be used) for the duration of the COVID-19 declaration under Section 564(b)(1) of the Act, 21 U.S.C. section 360bbb-3(b)(1), unless the authorization is terminated or revoked.  Performed at [facility], [address] [place], [place] [address]   MRSA Next Gen by PCR, Nasal     Status: None   Collection Time: 08/11/20 11:05 PM   Specimen: Nasal Mucosa; Nasal Swab  Result Value Ref Range Status   MRSA by PCR Next Gen NOT DETECTED NOT DETECTED Final    Comment: (NOTE) The GeneXpert MRSA Assay (FDA approved for NASAL specimens  only), is one component of a comprehensive MRSA colonization surveillance program. It is not intended to diagnose MRSA infection nor to guide or monitor treatment for MRSA infections. Test performance is not FDA approved in patients less than 74 years old. Performed at [facility], [address] [place], [place] [address]      Scheduled Meds:  Chlorhexidine Gluconate Cloth  6 each Topical Q0600   enoxaparin (LOVENOX) injection  40 mg Subcutaneous Q24H   Continuous Infusions:  dextrose 5% lactated ringers 125 mL/hr at 08/12/20 0750   insulin 3 Units/hr (08/12/20 0849)   lactated ringers     lactated ringers      Procedures/Studies: No results found.  Catarina Hartshorn, DO  [facility]  If 7PM-7AM, please contact night-coverage [email or web address] Password [email or web address] 08/12/2020, 9:04 AM   LOS: 1 day

## 2020-08-12 NOTE — Progress Notes (Signed)
  RD consulted for nutrition education regarding diabetes.   Lab Results  Component Value Date   HGBA1C 7.7 (A) 12/15/2019    RD provided "Diabetes Plate Method" handout.   Discussed different food groups and their effects on blood sugar, emphasizing carbohydrate-containing foods. Provided list of carbohydrates and recommended serving sizes of common foods.  Discussed importance of controlled and consistent carbohydrate intake throughout the day. Provided examples of ways to balance meals/snacks and encouraged intake of high-fiber, whole grain complex carbohydrates. Teach back method used.  Expect good compliance.  Body mass index is 42.14 kg/m. Pt meets criteria for morbid obesity based on current BMI.  Current diet order is CHO ,modified, patient is consuming approximately >75% of meals at this time.   Labs and medications reviewed.  BMP Latest Ref Rng & Units 08/12/2020 08/11/2020 08/11/2020  Glucose 70 - 99 mg/dL 829(H) 371(I) 967(EL)  BUN 6 - 20 mg/dL 8 9 13   Creatinine 0.44 - 1.00 mg/dL 3.81 0.17)  BUN/Creat Ratio 9 - 23 - - -  Sodium 135 - 145 mmol/L 140 139 131(L)  Potassium 3.5 - 5.1 mmol/L 3.2(L) 3.4(L) 5.0  Chloride 98 - 111 mmol/L 104 102 93(L)  CO2 22 - 32 mmol/L 28 26 21(L)  Calcium 8.9 - 10.3 mg/dL 5.10(C) 9.2 9.9     No further nutrition interventions warranted at this time. If additional nutrition issues arise, please re-consult RD.  5.8(N MS,RD,CSG,LDN Contact: Royann Shivers

## 2020-08-12 NOTE — Progress Notes (Signed)
Chaplain engaged in an initial visit with [name] and her wife.  [name] shared about the battles with depression she has had since she was a child.  Her response when she does not feel in control of her emotions or feelings is to disconnect and disassociate from everything around her.  [name] vocalized that she has been in the pattern of just leaving things and people since an adolescent as well.  She also voiced that she deals with anxiety.  Though she has taken medicine for depression in the past, she stated that it was only slightly helpful.  Chaplain asked her if she would like to talk to someone about her depression and she stated, "Yes."    [name] and her wife also talked about the state of their relationship.  They are currently going through a separation.  This has been hard on [name] to come to terms with but she has not been able to verbalize her feelings and emotions to her wife.  Chaplain provided space for [name] to let her wife know how she feels but she was unable to tell her.  Chaplain normalized and affirmed how hard in can be to relay feelings and emotions when we have never been given the tools to appropriately express them.    [name] also shared about the rejection and abandonment she has faced from her mom and siblings.  She has desired to be loved by them but has not always felt that.  [name]'s wife also vocalized the tension that exists between [place] and her family.  [name] currently stays with her mom which also stated may be a source of her depression.    Chaplain offered reflective listening, support, and presence.  Chaplain spoke with nurse about inputting psychiatric consult and obtaining resources.    08/12/20 1200  Clinical Encounter Type  Visited With Patient and family together  Visit Type Initial

## 2020-08-12 NOTE — TOC Initial Note (Addendum)
Transition of Care [facility]) - Initial/Assessment Note    Patient Details  Name: [name] MRN: [id number] Date of Birth: 1980/09/06  Transition [facility]) CM/SW Contact:    Leitha Bleak, RN Phone Number: 08/12/2020, 1:43 PM  Clinical Narrative:      TOC consulted by Chaplain.  Patient lives in [place] was visiting exwife here in [place]. Patient plans to go back to [facility]. No insurance and no PCP. TOC contacted [facility] in [place] [phone] , They states once the patient is back in [place] to have her call to schedule and complete Evaluation, they will add mental health eval if needed and will provide her a list of resources. Chaplain is delivering information and number to patient.            Addendum :  Provided patient with a MATCH Voucher.   Expected Discharge Plan: Home/Self Care Barriers to Discharge: Continued Medical Work up   Patient Goals and CMS Choice Patient states their goals for this hospitalization and ongoing recovery are:: to get better. CMS Medicare.gov Compare Post Acute Care list provided to:: Patient    Expected Discharge Plan and Services Expected Discharge Plan: Home/Self Care     Prior Living Arrangements/Services     Patient language and need for interpreter reviewed:: Yes        Need for Family Participation in Patient Care: Yes (Comment) Care giver support system in place?: Yes (comment)   Criminal Activity/Legal Involvement Pertinent to Current Situation/Hospitalization: No - Comment as needed  Activities of Daily Living Home Assistive Devices/Equipment: None ADL Screening (condition at time of admission) Patient's cognitive ability adequate to safely complete daily activities?: Yes Is the patient deaf or have difficulty hearing?: No Does the patient have difficulty seeing, even when wearing glasses/contacts?: No Does the patient have difficulty concentrating, remembering, or making decisions?: No Patient able to  express need for assistance with ADLs?: Yes Does the patient have difficulty dressing or bathing?: No Independently performs ADLs?: Yes (appropriate for developmental age) Does the patient have difficulty walking or climbing stairs?: No Weakness of Legs: None Weakness of Arms/Hands: None  Permission Sought/Granted    Emotional Assessment      Orientation: : Oriented to Self, Oriented to Place, Oriented to  Time, Oriented to Situation Alcohol / Substance Use: Not Applicable Psych Involvement: No (comment)  Admission diagnosis:  DKA (diabetic ketoacidosis) (HCC) [E11.10] Hyperglycemia [R73.9] Diabetic ketoacidosis without coma associated with type 2 diabetes mellitus (HCC) [E11.10] Patient Active Problem List   Diagnosis Date Noted   Hyperglycemia    DKA (diabetic ketoacidosis) (HCC) 08/11/2020   Abnormal Papanicolaou smear of cervix with positive human papilloma virus (HPV) test 01/29/2020   Encounter for gynecological examination with Papanicolaou smear of cervix 01/19/2020   Nicotine abuse 12/15/2019   Depression, major, single episode, mild (HCC) 09/08/2019   Type 2 diabetes mellitus with hyperglycemia, without long-term current use of insulin (HCC) 09/08/2019   Need for immunization against influenza 09/08/2019   Essential hypertension 09/08/2019   Hyperlipidemia 09/08/2019   Hypothyroidism 09/08/2019   Abnormal cervical Papanicolaou smear 09/08/2019   Other fatigue 09/08/2019   Morbid obesity with body mass index (BMI) of 45.0 to 49.9 in adult [facility]) 03/12/2019   OSA (obstructive sleep apnea) 12/11/2016   Asthma 10/17/2010   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 [address] [place] [address] [place] [address] Phone:  [phone] Fax: [phone]   Readmission Risk Interventions Readmission Risk Prevention Plan 08/12/2020  Medication Screening Complete  Transportation Screening  Complete  Some recent data might be hidden

## 2020-08-13 DIAGNOSIS — F320 Major depressive disorder, single episode, mild: Secondary | ICD-10-CM

## 2020-08-13 LAB — BASIC METABOLIC PANEL
Anion gap: 10 (ref 5–15)
BUN: 6 mg/dL (ref 6–20)
CO2: 24 mmol/L (ref 22–32)
Calcium: 8.8 mg/dL — ABNORMAL LOW (ref 8.9–10.3)
Chloride: 103 mmol/L (ref 98–111)
Creatinine, Ser: 0.69 mg/dL (ref 0.44–1.00)
GFR, Estimated: >60 mL/min (ref >60)
Glucose, Bld: 281 mg/dL — ABNORMAL HIGH (ref 70–99)
Potassium: 4.3 mmol/L (ref 3.5–5.1)
Sodium: 137 mmol/L (ref 135–145)

## 2020-08-13 LAB — GLUCOSE, CAPILLARY
Glucose-Capillary: 288 mg/dL — ABNORMAL HIGH (ref 70–99)
Glucose-Capillary: 280 mg/dL — ABNORMAL HIGH (ref 70–99)

## 2020-08-13 LAB — MAGNESIUM: Magnesium: 2.1 mg/dL (ref 1.7–2.4)

## 2020-08-13 MED ORDER — INSULIN STARTER KIT- PEN NEEDLES (ENGLISH): 1.0000 | Freq: Once | 0 refills | Status: AC

## 2020-08-13 MED ORDER — AMLODIPINE BESYLATE 5 MG PO TABS: 5.0000 mg | ORAL_TABLET | Freq: Every day | ORAL | 1 refills | Status: AC

## 2020-08-13 MED ORDER — METFORMIN HCL 500 MG PO TABS: 500.0000 mg | ORAL_TABLET | Freq: Two times a day (BID) | ORAL | 1 refills | Status: AC

## 2020-08-13 MED ORDER — AMLODIPINE BESYLATE 5 MG PO TABS: 5.0000 mg | ORAL_TABLET | Freq: Every day | ORAL | Status: DC

## 2020-08-13 MED ORDER — INSULIN GLARGINE-YFGN 100 UNIT/ML ~~LOC~~ SOLN
30.0000 [IU] | Freq: Every day | SUBCUTANEOUS | Status: DC
  Administered 2020-08-13: 30 [IU] via SUBCUTANEOUS
  Filled 2020-08-13 (×3): qty 0.3

## 2020-08-13 MED ORDER — NOVOLIN 70/30 (70-30) 100 UNIT/ML ~~LOC~~ SUSP: 20.0000 [IU] | Freq: Two times a day (BID) | SUBCUTANEOUS | 1 refills | Status: AC

## 2020-08-13 MED ORDER — ATORVASTATIN CALCIUM 40 MG PO TABS: 40.0000 mg | ORAL_TABLET | Freq: Every day | ORAL | 1 refills | Status: AC

## 2020-08-13 MED ORDER — LEVOTHYROXINE SODIUM 25 MCG PO TABS: 25.0000 ug | ORAL_TABLET | Freq: Every day | ORAL | 1 refills | Status: AC

## 2020-08-13 NOTE — Discharge Summary (Signed)
Physician Discharge Summary  [name] AXK:[id number] DOB: 01-14-1981 DOA: 08/11/2020  PCP: Jerel Shepherd, FNP  Admit date: 08/11/2020 Discharge date: 08/13/2020  Admitted From: home Disposition:  Home   Recommendations for Outpatient Follow-up:  Follow up with PCP in 1-2 weeks Please obtain BMP/CBC in one week    Discharge Condition: Stable CODE STATUS:FULL Diet recommendation: Heart Healthy / Carb Modified    Brief/Interim Summary: 40 year old female with a history of diabetes mellitus type 2, hypertension, depression, OSA, hypothyroidism, hyperlipidemia presenting with dizziness and lightheadedness and blurry vision for 3 to 4 days.  She denies any fevers, chills, chest pain, shortness breath, nausea, vomiting, diarrhea or abdominal pain.  She denies any focal extremity weakness.  There is no hematochezia, melena, hematuria, hematemesis.  Patient states that she has been off all her medications for at least 1 year.  She was previously on metformin and Victoza. In the emergency department, the patient was afebrile hemodynamically stable with oxygen saturation 98%.  BMP showed a serum glucose of 614 with anion gap of 17.  WBC 9.8, hemoglobin 16.7, platelets 151,000. The patient was started on intravenous insulin with IV fluids.  Discharge Diagnoses:   DKA type 2 -patient started on IV insulin with q 1 hour CBG check and q 4 hour BMPs -pt started on aggressive fluid resuscitation -Electrolytes were monitored and repleted -transitioned to  insulin once anion gap closed -diet was advanced once anion gap closed -HbA1C--pending at time of d/c -started on Lantus 30 units daily during hospitalization -due to cost and lack of insurance, pt to go home with 70/30 insulin--20 units bid   Essential hypertension -started amlodipine   Morbid obesity -BMI 42.4 -Lifestyle modification   Hypothyroidism -Continue Synthroid   Hyperlipidemia -Continue statin    Hypokalemia -replete -check mag--2.1  Depression -denies suicidal ideation -outpatient resources provided for mental health services    Discharge Instructions   Allergies as of 08/13/2020   No Known Allergies      Medication List     STOP taking these medications    albuterol 108 (90 Base) MCG/ACT inhaler Commonly known as: VENTOLIN HFA   Ashlyna 0.15-0.03 &0.01 MG tablet Generic drug: Levonorgestrel-Ethinyl Estradiol   carvedilol 12.5 MG tablet Commonly known as: COREG   carvedilol 25 MG tablet Commonly known as: COREG   escitalopram 10 MG tablet Commonly known as: LEXAPRO   gabapentin 100 MG capsule Commonly known as: NEURONTIN   losartan 50 MG tablet Commonly known as: COZAAR   NovoFine 32G X 6 MM Misc Generic drug: Insulin Pen Needle   Unifine Pentips 32G X 6 MM Misc Generic drug: Insulin Pen Needle   Victoza 18 MG/3ML Sopn Generic drug: liraglutide   Vitamin D3 1.25 MG (50000 UT) Caps       TAKE these medications    acetaminophen 500 MG tablet Commonly known as: TYLENOL Take 1,000 mg by mouth every 6 (six) hours as needed.   amLODipine 5 MG tablet Commonly known as: NORVASC Take 1 tablet (5 mg total) by mouth daily. Start taking on: August 14, 2020   atorvastatin 40 MG tablet Commonly known as: Lipitor Take 1 tablet (40 mg total) by mouth daily. What changed:  medication strength how much to take Another medication with the same name was removed. Continue taking this medication, and follow the directions you see here.   freestyle lancets 1 each by Other route in the morning and at bedtime.   FREESTYLE LITE test strip Generic drug: glucose blood  1 each by Other route in the morning and at bedtime.   insulin starter kit- pen needles Misc 1 kit by Other route once for 1 dose.   levothyroxine 25 MCG tablet Commonly known as: SYNTHROID Take 1 tablet (25 mcg total) by mouth daily before breakfast. What changed:  how much to  take how to take this when to take this Another medication with the same name was removed. Continue taking this medication, and follow the directions you see here.   metFORMIN 500 MG tablet Commonly known as: GLUCOPHAGE Take 1 tablet (500 mg total) by mouth 2 (two) times daily. What changed:  medication strength how much to take   metFORMIN 500 MG tablet Commonly known as: GLUCOPHAGE Take 1 tablet (500 mg total) by mouth 2 (two) times daily with a meal. What changed:  medication strength how much to take how to take this when to take this   NovoLIN 70/30 (70-30) 100 UNIT/ML injection Generic drug: insulin NPH-regular Human Inject 20 Units into the skin 2 (two) times daily with a meal.        No Known Allergies  Consultations: none   Procedures/Studies: No results found.      Discharge Exam: Vitals:   08/13/20 0316 08/13/20 1142  BP: 137/80 136/79  Pulse: 64 74  Resp: 19 18  Temp: 98.2 F (36.8 C) 98.3 F (36.8 C)  SpO2: 100% 100%   Vitals:   08/12/20 1944 08/12/20 2100 08/13/20 0316 08/13/20 1142  BP:  126/90 137/80 136/79  Pulse:  77 64 74  Resp:  (!) 25 19 18   Temp: 98.6 F (37 C)  98.2 F (36.8 C) 98.3 F (36.8 C)  TempSrc: Oral  Oral Oral  SpO2: 100% 100% 100% 100%  Weight:      Height:        General: Pt is alert, awake, not in acute distress Cardiovascular: RRR, S1/S2 +, no rubs, no gallops Respiratory: CTA bilaterally, no wheezing, no rhonchi Abdominal: Soft, NT, ND, bowel sounds + Extremities: no edema, no cyanosis   The results of significant diagnostics from this hospitalization (including imaging, microbiology, ancillary and laboratory) are listed below for reference.    Significant Diagnostic Studies: No results found.  Microbiology: Recent Results (from the past 240 hour(s))  Resp Panel by RT-PCR (Flu A&B, Covid) Nasopharyngeal Swab     Status: None   Collection Time: 08/11/20  4:45 PM   Specimen: Nasopharyngeal Swab;  Nasopharyngeal(NP) swabs in vial transport medium  Result Value Ref Range Status   SARS Coronavirus 2 by RT PCR NEGATIVE NEGATIVE Final    Comment: (NOTE) SARS-CoV-2 target nucleic acids are NOT DETECTED.  The SARS-CoV-2 RNA is generally detectable in upper respiratory specimens during the acute phase of infection. The lowest concentration of SARS-CoV-2 viral copies this assay can detect is 138 copies/mL. A negative result does not preclude SARS-Cov-2 infection and should not be used as the sole basis for treatment or other patient management decisions. A negative result may occur with  improper specimen collection/handling, submission of specimen other than nasopharyngeal swab, presence of viral mutation(s) within the areas targeted by this assay, and inadequate number of viral copies(<138 copies/mL). A negative result must be combined with clinical observations, patient history, and epidemiological information. The expected result is Negative.  Fact Sheet for Patients:  EntrepreneurPulse.com.au  Fact Sheet for Healthcare Providers:  IncredibleEmployment.be  This test is no t yet approved or cleared by the [place] and  has been authorized  for detection and/or diagnosis of SARS-CoV-2 by FDA under an Emergency Use Authorization (EUA). This EUA will remain  in effect (meaning this test can be used) for the duration of the COVID-19 declaration under Section 564(b)(1) of the Act, 21 U.S.C.section 360bbb-3(b)(1), unless the authorization is terminated  or revoked sooner.       Influenza A by PCR NEGATIVE NEGATIVE Final   Influenza B by PCR NEGATIVE NEGATIVE Final    Comment: (NOTE) The Xpert Xpress SARS-CoV-2/FLU/RSV plus assay is intended as an aid in the diagnosis of influenza from Nasopharyngeal swab specimens and should not be used as a sole basis for treatment. Nasal washings and aspirates are unacceptable for Xpert Xpress  SARS-CoV-2/FLU/RSV testing.  Fact Sheet for Patients: EntrepreneurPulse.com.au  Fact Sheet for Healthcare Providers: IncredibleEmployment.be  This test is not yet approved or cleared by the [place] FDA and has been authorized for detection and/or diagnosis of SARS-CoV-2 by FDA under an Emergency Use Authorization (EUA). This EUA will remain in effect (meaning this test can be used) for the duration of the COVID-19 declaration under Section 564(b)(1) of the Act, 21 U.S.C. section 360bbb-3(b)(1), unless the authorization is terminated or revoked.  Performed at [facility], [address], [place], [place] [address]   MRSA Next Gen by PCR, Nasal     Status: None   Collection Time: 08/11/20 11:05 PM   Specimen: Nasal Mucosa; Nasal Swab  Result Value Ref Range Status   MRSA by PCR Next Gen NOT DETECTED NOT DETECTED Final    Comment: (NOTE) The GeneXpert MRSA Assay (FDA approved for NASAL specimens only), is one component of a comprehensive MRSA colonization surveillance program. It is not intended to diagnose MRSA infection nor to guide or monitor treatment for MRSA infections. Test performance is not FDA approved in patients less than 17 years old. Performed at [facility], [address] [place], [place] [address]      Labs: Basic Metabolic Panel: Recent Labs  Lab 08/11/20 1726 08/11/20 2328 08/12/20 0637 08/13/20 0409  NA 131* 139 140 137  K 5.0 3.4* 3.2* 4.3  CL 93* 102 104 103  CO2 21* 26 28 24   GLUCOSE 614* 206* 133* 281*  BUN 13 9 8 6   CREATININE 1.04* 0.87 0.70 0.69  CALCIUM 9.9 9.2 8.8* 8.8*  MG  --   --   --  2.1   Liver Function Tests: Recent Labs  Lab 08/11/20 1726  AST 30  ALT 44  ALKPHOS 93  BILITOT 2.1*  PROT 8.4*  ALBUMIN 4.5   No results for input(s): LIPASE, AMYLASE in the last 168 hours. No results for input(s): AMMONIA in the last 168 hours. CBC: Recent Labs  Lab 08/11/20 1852  WBC 9.8   NEUTROABS 5.2  HGB 16.7*  HCT 54.7*  MCV 77.6*  PLT 151   Cardiac Enzymes: No results for input(s): CKTOTAL, CKMB, CKMBINDEX, TROPONINI in the last 168 hours. BNP: Invalid input(s): POCBNP CBG: Recent Labs  Lab 08/12/20 1154 08/12/20 1547 08/12/20 2121 08/13/20 0803 08/13/20 1139  GLUCAP 160* 294* 365* 288* 280*    Time coordinating discharge:  36 minutes  Signed:  Orson Eva, DO [facility] Pager: [phone] 08/13/2020, 12:07 PM

## 2020-08-15 LAB — HEMOGLOBIN A1C
Hgb A1c MFr Bld: 15 % — ABNORMAL HIGH (ref 4.8–5.6)
Mean Plasma Glucose: 384 mg/dL

## 2021-08-08 IMAGING — US US ABDOMEN COMPLETE
1 series · 14 of 25 positions shown · non-contrast
Comparison: None.

CLINICAL DATA: Upper abdominal pain

EXAM:
ABDOMEN ULTRASOUND COMPLETE

[Series 1: us abdomen complete · 14 of 91 slices shown]
[im 1/91]
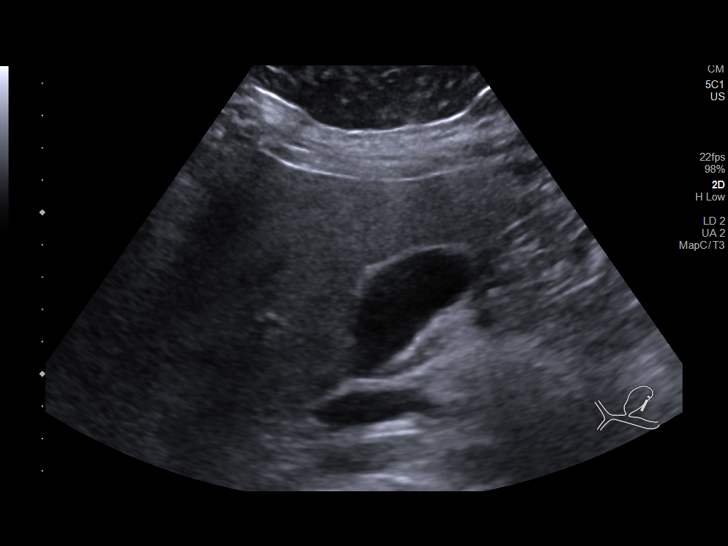
[im 8/91]
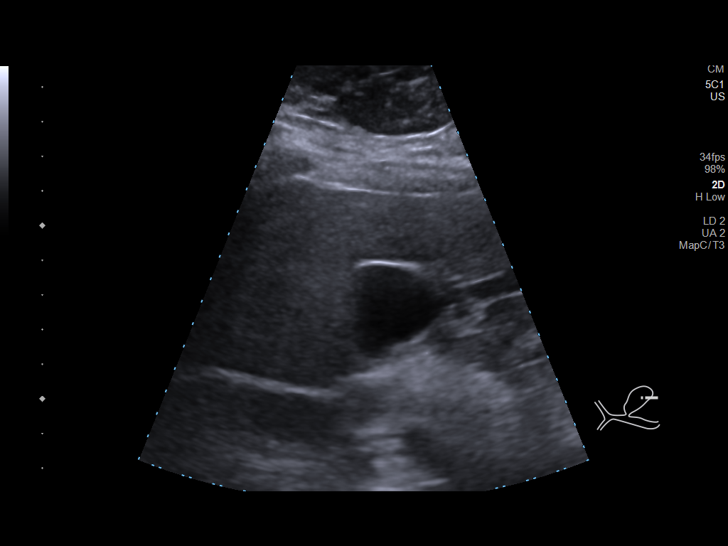
[im 16/91]
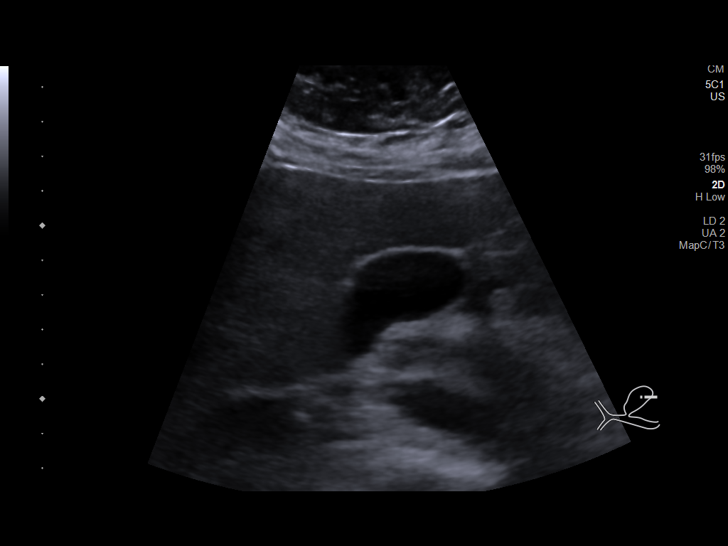
[im 23/91]
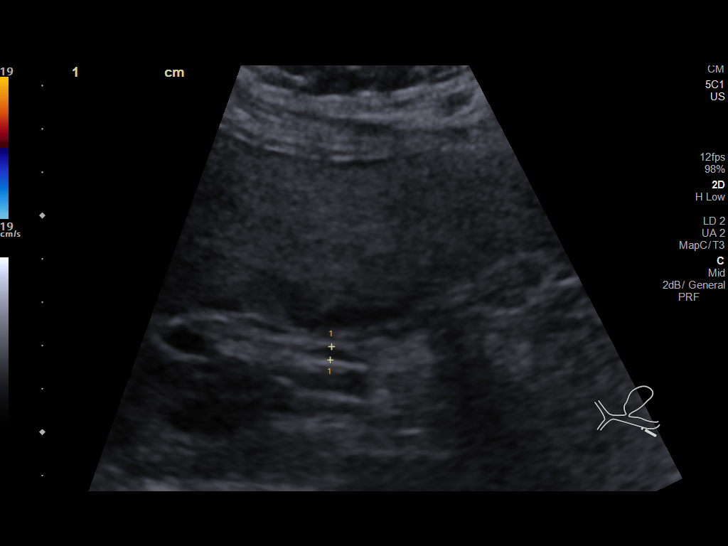
[im 31/91]
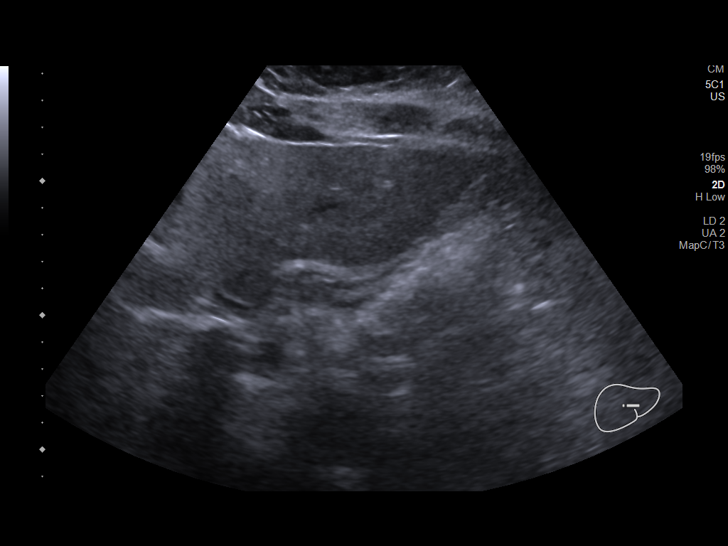
[im 34/91]
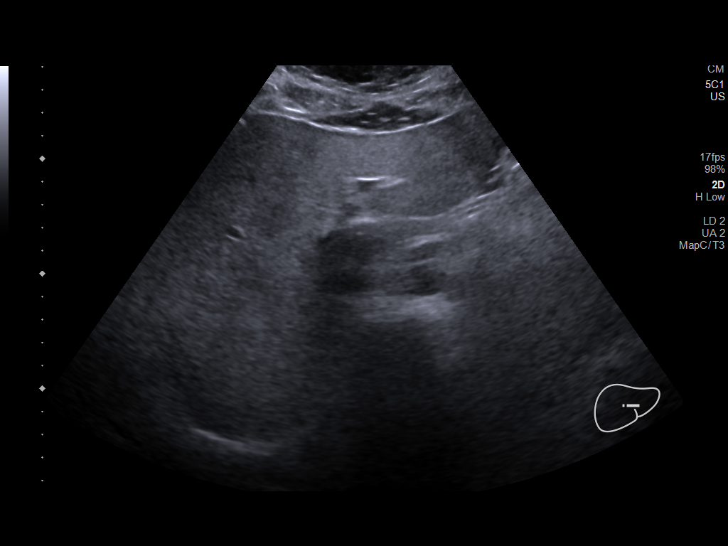
[im 42/91]
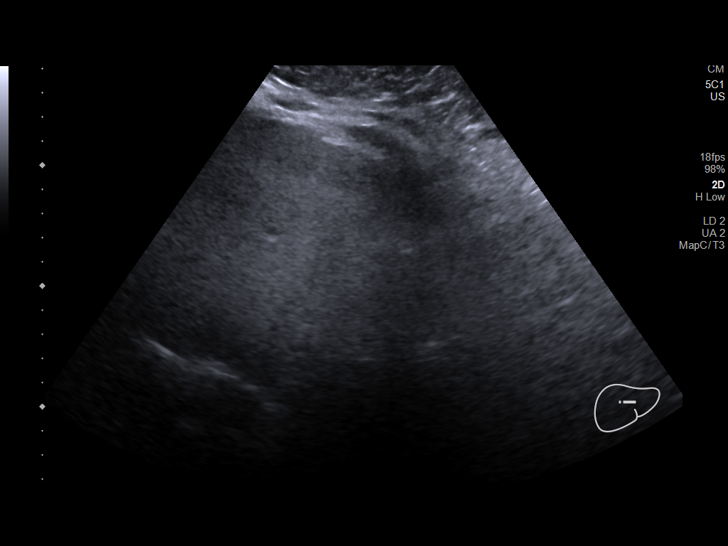
[im 49/91]
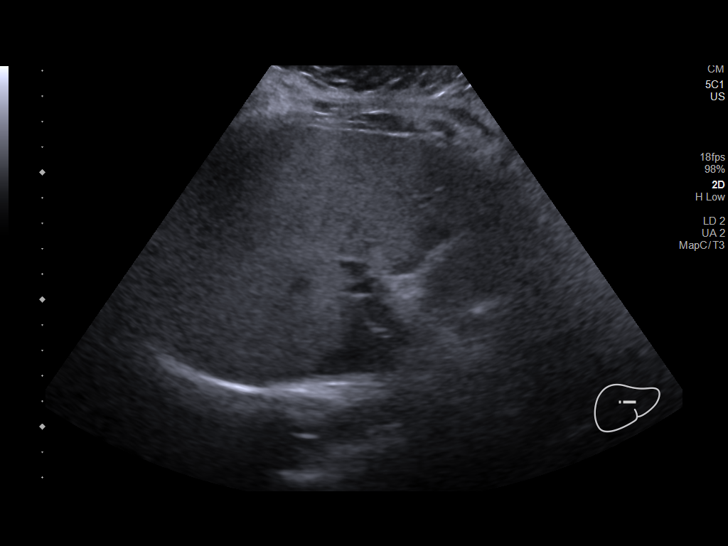
[im 57/91]
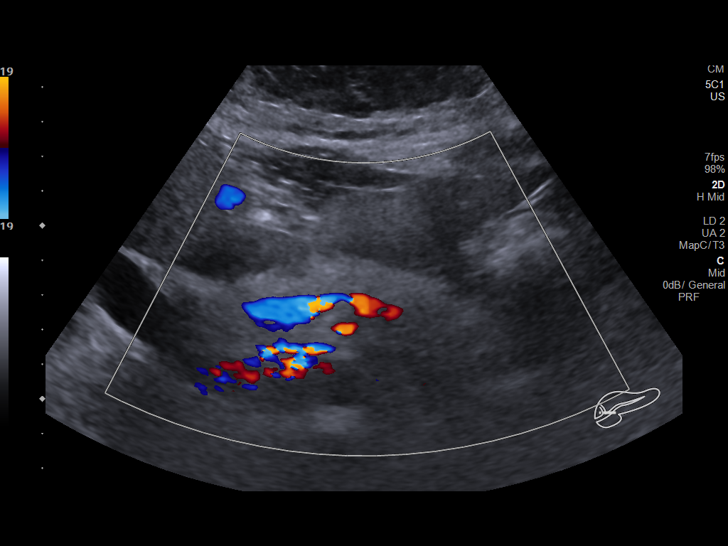
[im 61/91]
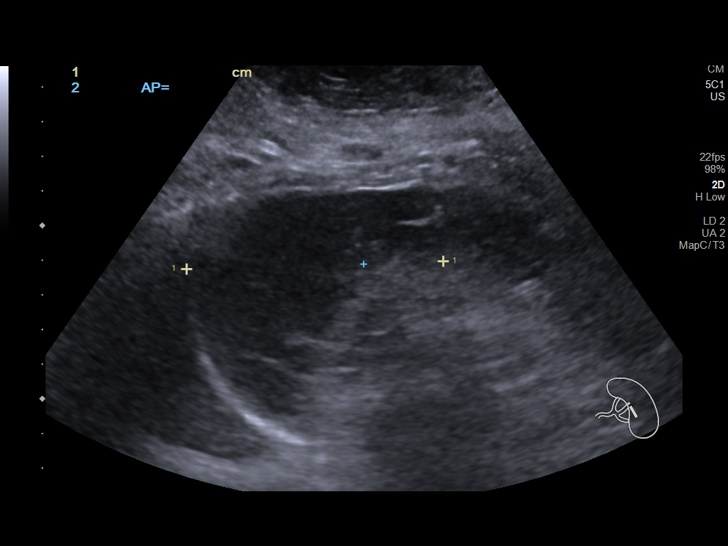
[im 68/91]
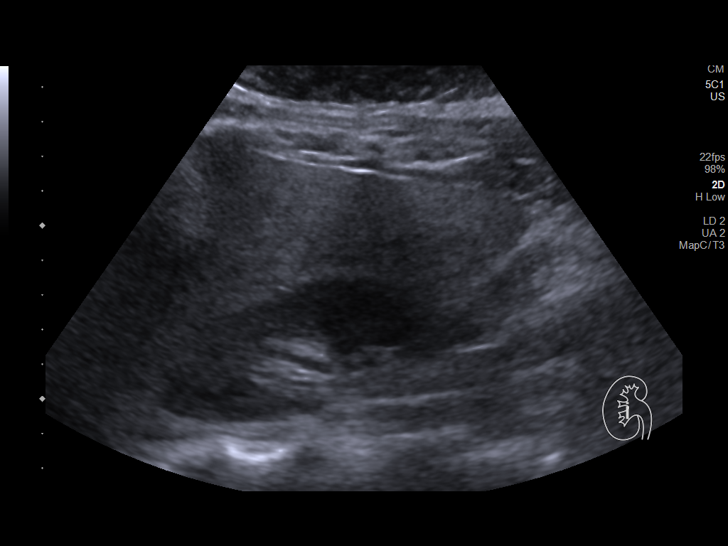
[im 76/91]
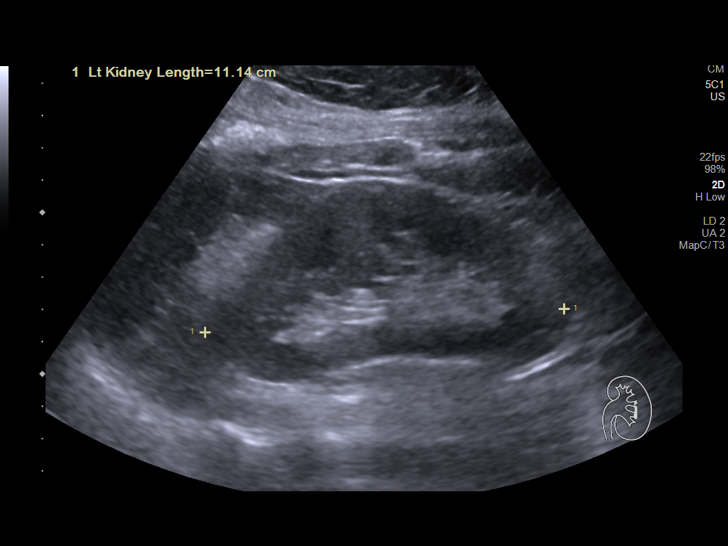
[im 83/91]
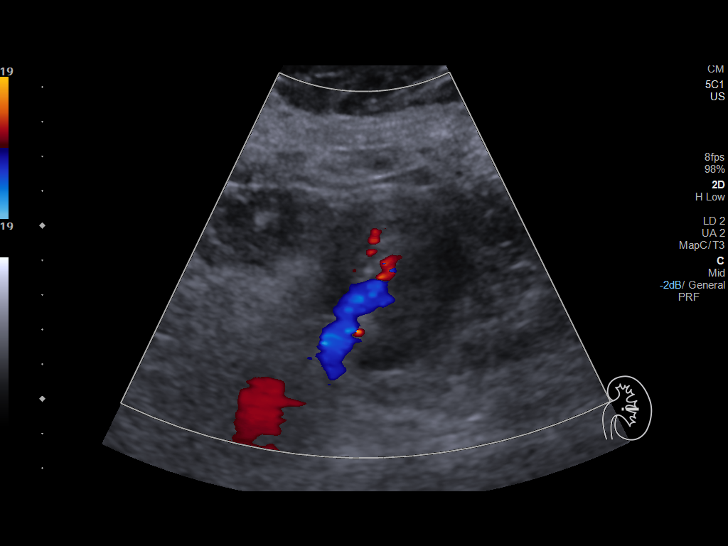
[im 91/91]
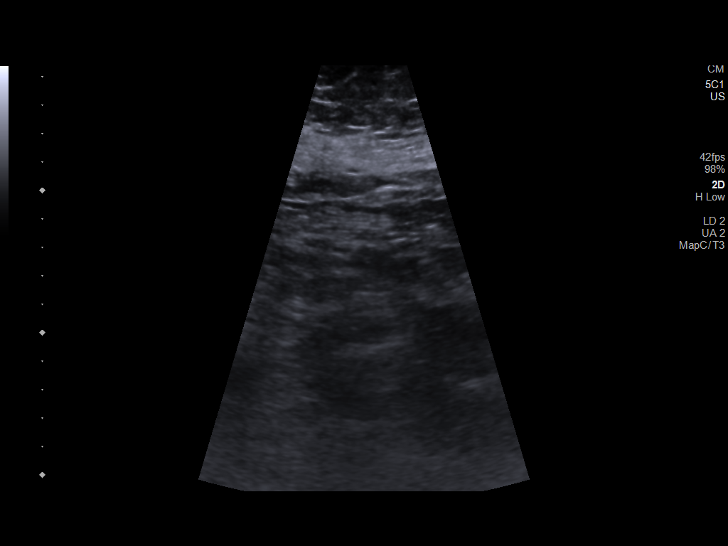

[14 of 25 positions shown; findings below may reference images not displayed]

FINDINGS: Gallbladder: No gallstones or wall thickening visualized. There is
no pericholecystic fluid. No sonographic Murphy sign noted by
sonographer.

Common bile duct: Diameter: 3 mm. No intrahepatic, common hepatic,
or common bile duct dilatation.

Liver: No focal lesion identified. Liver echogenicity overall is
increased. Portal vein is patent on color Doppler imaging with
normal direction of blood flow towards the liver.

IVC: No abnormality visualized.

Pancreas: No pancreatic mass or inflammatory focus.

Spleen: Size and appearance within normal limits.

Right Kidney: Length: 11.1 cm. Echogenicity within normal limits. No
mass or hydronephrosis visualized.

Left Kidney: Length: 11.1 cm. Echogenicity within normal limits. No
mass or hydronephrosis visualized.

Abdominal aorta: No aneurysm visualized.

Other findings: No demonstrable ascites.
IMPRESSION: Increased liver echogenicity, finding indicative of hepatic
steatosis. No focal liver lesions evident.

Study otherwise unremarkable.
